# Patient Record
Sex: Male | Born: 1980 | Race: Black or African American | Hispanic: No | Marital: Married | Smoking: Never smoker
Health system: Southern US, Community
[De-identification: ages and names within clinical notes are randomized; demographics above are authoritative.]

## PROBLEM LIST (undated history)

## (undated) VITALS — BP 115/74 | HR 97 | Temp 98.4°F | Resp 16 | Ht 69.8 in | Wt 142.0 lb

## (undated) DIAGNOSIS — F259 Schizoaffective disorder, unspecified: Secondary | ICD-10-CM

## (undated) DIAGNOSIS — R569 Unspecified convulsions: Secondary | ICD-10-CM

## (undated) DIAGNOSIS — F250 Schizoaffective disorder, bipolar type: Secondary | ICD-10-CM

---

## 2015-07-10 ENCOUNTER — Encounter (HOSPITAL_COMMUNITY): Payer: Self-pay | Admitting: Emergency Medicine

## 2015-07-10 ENCOUNTER — Emergency Department (HOSPITAL_COMMUNITY)
Admission: EM | Admit: 2015-07-10 | Discharge: 2015-07-11 | Disposition: A | Discharge status: Other Acute Inpatient Hospital | Payer: Self-pay | Attending: Emergency Medicine | Admitting: Emergency Medicine

## 2015-07-10 DIAGNOSIS — F29 Unspecified psychosis not due to a substance or known physiological condition: Secondary | ICD-10-CM | POA: Insufficient documentation

## 2015-07-10 DIAGNOSIS — R1110 Vomiting, unspecified: Secondary | ICD-10-CM | POA: Insufficient documentation

## 2015-07-10 DIAGNOSIS — R231 Pallor: Secondary | ICD-10-CM | POA: Insufficient documentation

## 2015-07-10 LAB — RAPID URINE DRUG SCREEN, HOSP PERFORMED
Amphetamines: NOT DETECTED
Barbiturates: NOT DETECTED
Benzodiazepines: NOT DETECTED
COCAINE: NOT DETECTED
OPIATES: NOT DETECTED
Tetrahydrocannabinol: NOT DETECTED

## 2015-07-10 LAB — COMPREHENSIVE METABOLIC PANEL
ALK PHOS: 45 U/L (ref 38–126)
ALT: 13 U/L — AB (ref 17–63)
ANION GAP: 9 (ref 5–15)
AST: 24 U/L (ref 15–41)
Albumin: 4.5 g/dL (ref 3.5–5.0)
BILIRUBIN TOTAL: 2 mg/dL — AB (ref 0.3–1.2)
BUN: <5 mg/dL — ABNORMAL LOW (ref 6–20)
CALCIUM: 10 mg/dL (ref 8.9–10.3)
CO2: 27 mmol/L (ref 22–32)
CREATININE: 0.89 mg/dL (ref 0.61–1.24)
Chloride: 104 mmol/L (ref 101–111)
Glucose, Bld: 117 mg/dL — ABNORMAL HIGH (ref 65–99)
Potassium: 3.7 mmol/L (ref 3.5–5.1)
Sodium: 140 mmol/L (ref 135–145)
TOTAL PROTEIN: 7.6 g/dL (ref 6.5–8.1)

## 2015-07-10 LAB — CBC
HCT: 43.3 % (ref 39.0–52.0)
HEMOGLOBIN: 15 g/dL (ref 13.0–17.0)
MCH: 32.3 pg (ref 26.0–34.0)
MCHC: 34.6 g/dL (ref 30.0–36.0)
MCV: 93.3 fL (ref 78.0–100.0)
Platelets: 228 10*3/uL (ref 150–400)
RBC: 4.64 MIL/uL (ref 4.22–5.81)
RDW: 13.2 % (ref 11.5–15.5)
WBC: 6.2 10*3/uL (ref 4.0–10.5)

## 2015-07-10 LAB — URINALYSIS, ROUTINE W REFLEX MICROSCOPIC
BILIRUBIN URINE: NEGATIVE
Glucose, UA: NEGATIVE mg/dL
HGB URINE DIPSTICK: NEGATIVE
KETONES UR: NEGATIVE mg/dL
Leukocytes, UA: NEGATIVE
Nitrite: NEGATIVE
Protein, ur: NEGATIVE mg/dL
SPECIFIC GRAVITY, URINE: 1.014 (ref 1.005–1.030)
pH: 7 (ref 5.0–8.0)

## 2015-07-10 LAB — ETHANOL

## 2015-07-10 MED ORDER — LORAZEPAM 1 MG PO TABS
1.0000 mg | ORAL_TABLET | Freq: Three times a day (TID) | ORAL | Status: DC | PRN
  Administered 2015-07-11: 1 mg via ORAL
  Filled 2015-07-10: qty 1

## 2015-07-10 NOTE — ED Provider Notes (Signed)
CSN: [id number]     Arrival date & time 07/10/15  0906 History   First MD Initiated Contact with Patient 07/10/15 0913     Chief Complaint  Patient presents with  . Immunizations     (Consider location/radiation/quality/duration/timing/severity/associated sxs/prior Treatment) HPI Patient presents with colleagues, case management to assist with the history of present illness. The patient still denies any physical pain, states that he would like to have immunizations to travel back to [place], his home country.  Patient arrived here 3 years ago, denies medical problems, colleagues corroborate this. Over the past 3 days, the patient has had increasingly atypical behavior, responding to both visual and auditory hallucination, having sleeplessness, repetitive speech. No report of new fever, vomiting, falling, trauma. Patient has no known psychiatric history.   History reviewed. No pertinent past medical history. History reviewed. No pertinent past surgical history. No family history on file. Social History  Substance Use Topics  . Smoking status: Never Smoker   . Smokeless tobacco: Never Used  . Alcohol Use: No    Review of Systems  Unable to perform ROS: Psychiatric disorder  Constitutional: Positive for activity change.  HENT: Negative.   Eyes: Negative for visual disturbance.  Respiratory: Negative for chest tightness.   Cardiovascular: Negative for chest pain.  Gastrointestinal: Positive for vomiting. Negative for nausea.  Skin: Positive for pallor.  Allergic/Immunologic: Negative for immunocompromised state.      Allergies  Review of patient's allergies indicates no known allergies.  Home Medications   Prior to Admission medications   Medication Sig Start Date End Date Taking? Authorizing Provider  ibuprofen (ADVIL,MOTRIN) 200 MG tablet Take 400 mg by mouth every 6 (six) hours as needed.   Yes Historical Provider, MD   BP 136/87 mmHg  Pulse 89  Temp(Src) 98.3 F  (36.8 C) (Oral)  Resp 17  SpO2 100% Physical Exam  Constitutional: He is oriented to person, place, and time. He appears well-developed. No distress.  HENT:  Head: Normocephalic and atraumatic.  Eyes: Conjunctivae and EOM are normal.  Cardiovascular: Normal rate and regular rhythm.   Pulmonary/Chest: Effort normal. No stridor. No respiratory distress.  Abdominal: He exhibits no distension.  Musculoskeletal: He exhibits no edema.  Neurological: He is alert and oriented to person, place, and time.  Skin: Skin is warm and dry.  Psychiatric: He is slowed and withdrawn. Thought content is delusional.  Nursing note and vitals reviewed.   ED Course  Procedures (including critical care time) Labs Review Labs Reviewed  COMPREHENSIVE METABOLIC PANEL - Abnormal; Notable for the following:    Glucose, Bld 117 (*)    BUN <5 (*)    ALT 13 (*)    Total Bilirubin 2.0 (*)    All other components within normal limits  MALARIA SMEAR  URINE RAPID DRUG SCREEN, HOSP PERFORMED  CBC  ETHANOL  URINALYSIS, ROUTINE W REFLEX MICROSCOPIC (NOT AT [facility])   Malaria smear pending   Initial labs reviewed, patient medically cleared  for psychiatric evaluation   2:03 PM Patient carries about the need for hospitalization, but amenable to evaluation  MDM   this young male presents with concern from family members for new atypical behavior, sleeplessness.  Symptoms consistent with psychosis versus manic behavior. Patient is not overtly suicidal, homicidal. However, with his new change in behavior, he is medically clear for further psychiatric evaluation.   Gerhard Munchobert Twanna Resh, MD 07/10/15 [phone]

## 2015-07-10 NOTE — ED Notes (Signed)
Patient states he needs vaccines to go to his country of [place].  Patient is unclear as to what he needs.

## 2015-07-10 NOTE — ED Notes (Signed)
Pt woke up screaming (in pure terror) from sleep.  He apparently experienced a night terror.  When RN arrived to room, RN spoke in a calm reassuring voice which calmed the pt down.  His visitor gave him a bible to read which further assisted in calming the pt down.  Visitor stated this has been occuring nightly for the past three nights.  Pt was unable to verbalize what he experienced.

## 2015-07-10 NOTE — BH Assessment (Addendum)
Tele Assessment Note   Peter Golden is an 35 y.o. male who present voluntarily to [facility] under the request of his friend, Peter Golden [phone]). Pt believes that he is in the ED to receive vaccinations to go back to his hometown [place]. Counselor spoke to [place] before assessment as a collateral contact, given the circumstances.  Peter Golden shared that she, as well as pt's roommate, have noticed some behavioral changes in the pt in the past few weeks or so, but more specifically since this past Friday. Peter Golden reported that pt has become very isolative, has not been doing well in school (which is very unlike him), not eating or sleeping well, and talking to himself. Peter Golden added that she asked pt to spend the weekend with her and her family, due to his bizarre behaviors. She stated that, while there, he was isolative and stayed awake the entire night, taking to himself. Peter Golden also shared that, prior to this, pt called her and informed her that he was going to die and asked her to pray for him. Peter Golden additionally reported that, per his roommate, pt has not been sleeping at all, when at home. Peter Golden indicated that, over the last few days, pt has been pre-occupied with going back to his hometown [place] and has been continually talking about it. She shared that they have tried to redirect pt and he will refer right back to going to [place]. Peter Golden reports that pt does not use drugs or alcohol and has no psychiatric hx, that she is aware of.   Counselor assessed pt by himself. Pt was a poor historian and had confused speech. He did not appear to be responding to internal stimuli, but was clearly preoccupied with going back to his hometown to "resolve an issue" and that he "will come back in 3 days". Counselor attempted to ask pt questions about AVH and if he had taken or been given any strange drinks or pills. Pt denied both and referred back to having to get to his hometown to resolve an  issue and returning in 3 days. Counselor asked pt about not sleeping and talking to himself all night. Pt stated that he sometimes talks to himself, but would stop once he got to [place] and resolved the issue and returned in 3 days. UDS and ETOH labs not completed at time of this writing.   Diagnosis: Unspecified schizophrenia spectrum and other psychotic disorder  Past Medical History: History reviewed. No pertinent past medical history.  History reviewed. No pertinent past surgical history.  Family History: No family history on file.  Social History:  reports that he has never smoked. He has never used smokeless tobacco. He reports that he does not drink alcohol or use illicit drugs.  Additional Social History:  Alcohol / Drug Use Pain Medications: unknown Prescriptions: unknown Over the Counter: unknown History of alcohol / drug use?: No history of alcohol / drug abuse (per collateral from family friend, Biomedical engineer)  CIWA: CIWA-Ar BP: 146/93 mmHg Pulse Rate: 87 COWS:    PATIENT STRENGTHS: (choose at least two) Average or above average intelligence Capable of independent living Supportive family/friends  Allergies: No Known Allergies  Home Medications:  (Not in a hospital admission)  OB/GYN Status:  No LMP for male patient.  General Assessment Data Location of Assessment: [facility] ED TTS Assessment: In system Is this a Tele or Face-to-Face Assessment?: Tele Assessment Is this an Initial Assessment or a Re-assessment for this encounter?: Initial Assessment Marital status:  Single Is patient pregnant?: No Pregnancy Status: No Living Arrangements: Non-relatives/Friends (roommate) Can pt return to current living arrangement?: Yes Admission Status: Voluntary (pt believes he's here to get vaccines to go home to [place]) Is patient capable of signing voluntary admission?: Yes Referral Source: Self/Family/Friend Insurance type: none  Medical Screening Exam [facility] Walk-in ONLY) Medical  Exam completed: Yes  Crisis Care Plan Living Arrangements: Non-relatives/Friends (roommate) Name of Psychiatrist: none Name of Therapist: none  Education Status Is patient currently in school?: Yes Current Grade: college Highest grade of school patient has completed: in college Name of school: [employer]  Risk to self with the past 6 months Suicidal Ideation: No Has patient been a risk to self within the past 6 months prior to admission? : No Suicidal Intent: No Has patient had any suicidal intent within the past 6 months prior to admission? : No Is patient at risk for suicide?: No Suicidal Plan?: No Has patient had any suicidal plan within the past 6 months prior to admission? : No Access to Means: No What has been your use of drugs/alcohol within the last 12 months?: friend, Peter PeerShantal, reports no use Previous Attempts/Gestures: No Intentional Self Injurious Behavior: None Family Suicide History: Unknown Recent stressful life event(s): Other (Comment) (unknown) Persecutory voices/beliefs?: No Depression: No Substance abuse history and/or treatment for substance abuse?: No Suicide prevention information given to non-admitted patients: Not applicable  Risk to Others within the past 6 months Homicidal Ideation: No Does patient have any lifetime risk of violence toward others beyond the six months prior to admission? : Unknown Thoughts of Harm to Others: No Current Homicidal Intent: No Current Homicidal Plan: No Access to Homicidal Means: No History of harm to others?: No Assessment of Violence: None Noted Does patient have access to weapons?: No Criminal Charges Pending?: No Does patient have a court date: No Is patient on probation?: No  Psychosis Hallucinations:  (collateral indicates pt talking to himself all night) Delusions: Unspecified (pt preoccupied with going to home country due to "issue")  Mental Status Report Appearance/Hygiene: Unremarkable Eye  Contact: Fair Motor Activity: Unremarkable Speech: Other (Comment) (confused ) Level of Consciousness: Quiet/awake Mood: Apprehensive, Preoccupied Affect: Apprehensive, Preoccupied Anxiety Level: Minimal Thought Processes: Unable to Assess Judgement: Unable to Assess Orientation: Unable to assess Obsessive Compulsive Thoughts/Behaviors: Unable to Assess  Cognitive Functioning Concentration: Decreased Memory: Unable to Assess IQ: Average Insight: Unable to Assess Impulse Control: Unable to Assess Appetite: Poor Sleep: Decreased Total Hours of Sleep: 1 Vegetative Symptoms: None  ADLScreening [facility] Assessment Services) Patient's cognitive ability adequate to safely complete daily activities?: Yes Patient able to express need for assistance with ADLs?: Yes Independently performs ADLs?: Yes (appropriate for developmental age)  Prior Inpatient Therapy Prior Inpatient Therapy: No  Prior Outpatient Therapy Prior Outpatient Therapy: No Does patient have an ACCT team?: No Does patient have Intensive In-House Services?  : No Does patient have [facility] services? : No Does patient have P4CC services?: No  ADL Screening (condition at time of admission) Patient's cognitive ability adequate to safely complete daily activities?: Yes Is the patient deaf or have difficulty hearing?: No Does the patient have difficulty seeing, even when wearing glasses/contacts?: No Does the patient have difficulty concentrating, remembering, or making decisions?: Yes Patient able to express need for assistance with ADLs?: Yes Does the patient have difficulty dressing or bathing?: No Independently performs ADLs?: Yes (appropriate for developmental age) Does the patient have difficulty walking or climbing stairs?: No Weakness of Legs: None Weakness of Arms/Hands:  None  Home Assistive Devices/Equipment Home Assistive Devices/Equipment: None  Therapy Consults (therapy consults require a physician  order) PT Evaluation Needed: No OT Evalulation Needed: No SLP Evaluation Needed: No Abuse/Neglect Assessment (Assessment to be complete while patient is alone) Physical Abuse:  (unknown) Verbal Abuse:  (unknown) Sexual Abuse:  (unknown) Exploitation of patient/patient's resources:  (unknown) Self-Neglect:  (unknown) Values / Beliefs Cultural Requests During Hospitalization: None Spiritual Requests During Hospitalization: None Consults Spiritual Care Consult Needed: No Social Work Consult Needed: No      Additional Information 1:1 In Past 12 Months?: No CIRT Risk: No Elopement Risk: No Does patient have medical clearance?: Yes     Disposition:  Disposition Initial Assessment Completed for this Encounter: Yes Disposition of Patient: Inpatient treatment program (per Peter Kaufmann, NP) Type of inpatient treatment program: Adult  Laddie Aquas 07/10/2015 11:36 AM

## 2015-07-10 NOTE — Progress Notes (Signed)
Patient has been referred at: [facility] - per Mickeal SkinnerPhoebe, fax referral for review. [facility] - per Despina HiddenKassy, accepting referrals today. [facility] - per Arlys JohnBrian, fax referral. [facility] - [facility], [facility] beds. [facility] - per Christiane HaJonathan, fax referral for review, 1 geriatric male bed available.  CSW will continue to seek placement.  Melbourne Abtsatia Marley Charlot, LCSWA Disposition staff 07/10/2015 3:59 PM

## 2015-07-10 NOTE — ED Notes (Signed)
Thrown away uneaten dinner, asked pt if he wanted anything to eat.  He accepted water and a snack.  Pt appeared very w/drawn.

## 2015-07-10 NOTE — ED Notes (Signed)
Security wanded patient 

## 2015-07-10 NOTE — ED Notes (Signed)
Spoke with Restaurant manager, fast foodDirector Misty concerning pt and need for family at bedside. Explained that pt is not SI/HI but having flight of ideas, not sleeping, not understanding why he is here. All he understands is that [place] go [place]. It would be beneficial for family member to be with pt at this time. Misty agreeable to having one family member sit with pt to ensure his safety. No need for IVC. Pt's roommates too talk belongings home. Pt's family member at bedside. Pt in scrubs, has been wanded and is resting in room.

## 2015-07-10 NOTE — ED Notes (Signed)
TTS monitor at bedside with the patient.

## 2015-07-11 ENCOUNTER — Encounter (HOSPITAL_COMMUNITY): Payer: Self-pay | Admitting: *Deleted

## 2015-07-11 ENCOUNTER — Inpatient Hospital Stay (HOSPITAL_COMMUNITY)
Admission: AD | Admit: 2015-07-11 | Discharge: 2015-07-18 | DRG: 885 | Disposition: A | Discharge status: Home / Self Care | Payer: Federal, State, Local not specified - Other | Source: Intra-hospital | Attending: Psychiatry | Admitting: Psychiatry

## 2015-07-11 DIAGNOSIS — F209 Schizophrenia, unspecified: Secondary | ICD-10-CM | POA: Diagnosis present

## 2015-07-11 DIAGNOSIS — E221 Hyperprolactinemia: Secondary | ICD-10-CM | POA: Diagnosis present

## 2015-07-11 DIAGNOSIS — F323 Major depressive disorder, single episode, severe with psychotic features: Principal | ICD-10-CM | POA: Clinically undetermined

## 2015-07-11 DIAGNOSIS — F419 Anxiety disorder, unspecified: Secondary | ICD-10-CM | POA: Diagnosis present

## 2015-07-11 DIAGNOSIS — E785 Hyperlipidemia, unspecified: Secondary | ICD-10-CM | POA: Diagnosis present

## 2015-07-11 DIAGNOSIS — G4700 Insomnia, unspecified: Secondary | ICD-10-CM | POA: Diagnosis present

## 2015-07-11 DIAGNOSIS — F329 Major depressive disorder, single episode, unspecified: Secondary | ICD-10-CM | POA: Diagnosis not present

## 2015-07-11 DIAGNOSIS — F29 Unspecified psychosis not due to a substance or known physiological condition: Secondary | ICD-10-CM | POA: Diagnosis not present

## 2015-07-11 DIAGNOSIS — F251 Schizoaffective disorder, depressive type: Secondary | ICD-10-CM | POA: Clinically undetermined

## 2015-07-11 DIAGNOSIS — R45851 Suicidal ideations: Secondary | ICD-10-CM | POA: Diagnosis not present

## 2015-07-11 MED ORDER — ACETAMINOPHEN 325 MG PO TABS: 650.0000 mg | ORAL_TABLET | Freq: Four times a day (QID) | ORAL | Status: DC | PRN

## 2015-07-11 MED ORDER — LORAZEPAM 1 MG PO TABS: 1.0000 mg | ORAL_TABLET | ORAL | Status: DC | PRN

## 2015-07-11 MED ORDER — ALUM & MAG HYDROXIDE-SIMETH 200-200-20 MG/5ML PO SUSP: 30.0000 mL | ORAL | Status: DC | PRN

## 2015-07-11 MED ORDER — RISPERIDONE 2 MG PO TBDP: 2.0000 mg | ORAL_TABLET | Freq: Three times a day (TID) | ORAL | Status: DC | PRN

## 2015-07-11 MED ORDER — HYDROXYZINE HCL 25 MG PO TABS
25.0000 mg | ORAL_TABLET | Freq: Four times a day (QID) | ORAL | Status: DC | PRN
  Filled 2015-07-11: qty 10

## 2015-07-11 MED ORDER — MAGNESIUM HYDROXIDE 400 MG/5ML PO SUSP: 30.0000 mL | Freq: Every day | ORAL | Status: DC | PRN

## 2015-07-11 MED ORDER — ZIPRASIDONE MESYLATE 20 MG IM SOLR: 20.0000 mg | INTRAMUSCULAR | Status: DC | PRN

## 2015-07-11 MED ORDER — TRAZODONE HCL 50 MG PO TABS
50.0000 mg | ORAL_TABLET | Freq: Every evening | ORAL | Status: DC | PRN
  Administered 2015-07-12 – 2015-07-17 (×6): 50 mg via ORAL
  Filled 2015-07-11 (×17): qty 1

## 2015-07-11 NOTE — Progress Notes (Addendum)
Admission note: Patient is a 35 yo male who presented to [facility] accompanied by a friend.  Patient believed that he was coming to the ED to get vaccinations to go back to his hometown [place].  His friend reported that patient has had some behavioral changes.  His roommate has also noted some changes in the patient's behavior for the past few weeks.  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  Patient is taking accounting classes at [employer].  He has not been eating or sleeping well and has been observed talking to himself.  He has been staying awake at night talking to himself.  Per his friend, Lily PeerShantal, patient has been preoccupied about returning to [place].  Patient is from [place] and has [place] in [place]. Patient has no prior psyche hx and does not use alcohol, tobacco and drugs.  Patient presents with anxious affect.  He states, "I need protection when I leave here." "I'm not safe in this country."  When talking about protection, he becomes very anxious.  He is paranoid and does report hearing voices.  When asked what the voices are saying, he states, "they keep telling me I'm unsafe.  I am not safe."  Patient denies any self harm thoughts or homicidal ideation.  Patient has heavy accent and sometimes gets confused when asked questions.  He speaks [place], however, his main language is [place].  Patient was assured he was safe here; he was oriented to room and unit.

## 2015-07-11 NOTE — ED Notes (Signed)
Pt was unable to go back to sleep after the terror, became restless however was redirected by visitor.  Pt and visitor continued to talk (in french) and it appeared that visitor was able to calm the pt back down.  As RN took vitals, RN asked if the pt was hungry.  He replied that he was and RN provided pt w/ a Malaw[place]urkey sandwich and apple sauce.  He also requested water.  He appeared much more relaxed.

## 2015-07-11 NOTE — ED Notes (Addendum)
Patient sister called to talk to patient, Pt. Was in shower at the time. She said she will call back later, or come up here after work. I informed patient that she called.

## 2015-07-11 NOTE — Progress Notes (Signed)
Adult Psychoeducational Group Note  Date:  07/11/2015 Time:  8:30 PM  Group Topic/Focus:  Wrap-Up Group:   The focus of this group is to help patients review their daily goal of treatment and discuss progress on daily workbooks.  Participation Level:  Did Not Attend  Pt did not attend wrap-up group.    Peter NeerJasmine S [name] 07/11/2015, 8:39 PM

## 2015-07-11 NOTE — ED Notes (Signed)
Pt to shower

## 2015-07-11 NOTE — ED Notes (Signed)
Patient was given a snack and drink. A regular diet ordered for lunch. 

## 2015-07-11 NOTE — Progress Notes (Signed)
[facility] Dr. Elna BreslowEappen. Can arrive anytime after 3pm. Report number is [id number].  Ilean SkillMeghan Maribeth Jiles, MSW, LCSW Clinical Social Work, Disposition  07/11/2015 [phone]

## 2015-07-11 NOTE — ED Notes (Signed)
Breakfast try delivered

## 2015-07-11 NOTE — ED Notes (Signed)
Patient given snack. Dinner tray ordered,.

## 2015-07-11 NOTE — ED Notes (Signed)
No belongings located for patient.

## 2015-07-11 NOTE — ED Notes (Signed)
Lunch Tray delivered 

## 2015-07-11 NOTE — Tx Team (Addendum)
Initial Interdisciplinary Treatment Plan   PATIENT STRESSORS: Educational concerns Traumatic event   PATIENT STRENGTHS: Average or above average intelligence Capable of independent living Communication skills Physical Health Supportive family/friends   PROBLEM LIST: Problem List/Patient Goals Date to be addressed Date deferred Reason deferred Estimated date of resolution  Psychosis 07/11/2015     Anxiety 07/11/2015     Maladjustment to living in new country 07/10/2105     Some communication barrier/French is main language 07/11/2015           "I need to get rest and sleep"                         DISCHARGE CRITERIA:  Improved stabilization in mood, thinking, and/or behavior Need for constant or close observation no longer present Verbal commitment to aftercare and medication compliance  PRELIMINARY DISCHARGE PLAN: Outpatient therapy Return to previous living arrangement Return to previous work or school arrangements  PATIENT/FAMIILY INVOLVEMENT: This treatment plan has been presented to and reviewed with the patient, Peter Golden.  The patient and family have been given the opportunity to ask questions and make suggestions.  Cranford MonBeaudry, Caroline Evans 07/11/2015, 6:10 PM

## 2015-07-12 ENCOUNTER — Encounter (HOSPITAL_COMMUNITY): Payer: Self-pay | Admitting: Psychiatry

## 2015-07-12 DIAGNOSIS — F209 Schizophrenia, unspecified: Secondary | ICD-10-CM | POA: Clinically undetermined

## 2015-07-12 DIAGNOSIS — R45851 Suicidal ideations: Secondary | ICD-10-CM

## 2015-07-12 DIAGNOSIS — F29 Unspecified psychosis not due to a substance or known physiological condition: Secondary | ICD-10-CM

## 2015-07-12 DIAGNOSIS — F329 Major depressive disorder, single episode, unspecified: Secondary | ICD-10-CM

## 2015-07-12 DIAGNOSIS — F251 Schizoaffective disorder, depressive type: Secondary | ICD-10-CM | POA: Clinically undetermined

## 2015-07-12 MED ORDER — CITALOPRAM HYDROBROMIDE 10 MG PO TABS
10.0000 mg | ORAL_TABLET | Freq: Every day | ORAL | Status: DC
  Administered 2015-07-12 – 2015-07-16 (×5): 10 mg via ORAL
  Filled 2015-07-12 (×6): qty 1

## 2015-07-12 MED ORDER — HALOPERIDOL 5 MG PO TABS
5.0000 mg | ORAL_TABLET | Freq: Every day | ORAL | Status: DC
  Administered 2015-07-12: 5 mg via ORAL
  Filled 2015-07-12 (×3): qty 1

## 2015-07-12 MED ORDER — BENZTROPINE MESYLATE 0.5 MG PO TABS
0.5000 mg | ORAL_TABLET | Freq: Every day | ORAL | Status: DC
  Administered 2015-07-12 – 2015-07-17 (×6): 0.5 mg via ORAL
  Filled 2015-07-12: qty 7
  Filled 2015-07-12 (×7): qty 1
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
## 2015-07-12 NOTE — H&P (Signed)
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 Diagnosis:   Patient Active Problem List   Diagnosis Date Noted  . Psychosis [F29] 07/12/2015  . Depression [F32.9] 07/12/2015   History of Present Illness:: Peter Golden is a 35 y.o.  AA male, single, student at [employer], denies hx of past mental illness or medical problems, presented to [facility]  voluntarily  under the request of his friend, Peter Golden [phone]). Pt was thereafter admitted to [facility] for further management.  Per initial notes in EHR " Pt believes that he is in the ED to receive vaccinations to go back to his hometown of [place], [place]. Counselor spoke to Peter Golden before assessment as a collateral contact, given the circumstances. Peter Golden shared that she, as well as pt's roommate, have noticed some behavioral changes in the pt in the past few weeks or so, but more specifically since this past Friday. Peter Golden reported that pt has become very isolative, has not been doing well in school (which is very unlike him), not eating or sleeping well, and talking to himself. Peter Golden added that she asked pt to spend the weekend with her and her family, due to his bizarre behaviors. She stated that, while there, he was isolative and stayed awake the entire night, taking to himself. Peter Golden also shared that, prior to this, pt called her and informed her that he was going to die and asked her to pray for him. Peter Golden additionally reported that, per his roommate, pt has not been sleeping at all, when at home. Peter Golden indicated that, over the last few days, pt has been pre-occupied with going back to his hometown in [place], [place] and has been  continually talking about it. She shared that they have tried to redirect pt and he will refer right back to going to [place]. Peter Golden reports that pt does not use drugs or alcohol and has no psychiatric hx, that she is aware of. Counselor assessed pt by himself. Pt was a poor historian and had confused speech. He did not appear to be responding to internal stimuli, but was clearly preoccupied with going back to his hometown to "resolve an issue" and that he "will come back in 3 days". Counselor attempted to ask pt questions about AVH and if he had taken or been given any strange drinks or pills. Pt denied both and referred back to having to get to his hometown to resolve an issue and returning in 3 days. Counselor asked pt about not sleeping and talking to himself all night. Pt stated that he sometimes talks to himself, but would stop once he got to [place] and resolved the issue and returned in 3 days."   Patient seen and chart reviewed TODAY .Discussed patient with treatment team.  Patient today seen as calm , cooperative , pleasant. Pt speaks english , but sometimes questions needs to be broken down in to simpler sentences. Pt reports that he is originally from [place], [place]. Pt came to [place] , 3 years ago for his education. Pt reports that he currently goes to [employer] and is hoping to get his Bachelors soon. Pt wants to get a job and also bring his family here. Pt reports that all his family is  in [place]. He has not seen them in three years. Pt reports that he has an Uncle here - who is not really an uncle , but a support system , who helped him when he originally came to [place]. Pt reports that he moved out from his Uncles place and currently lives in an apartment. However , he feels that his uncle is still trying to control him, not really giving him his freedom. Pt states that this is a 'free country" , but his uncle still wants to control him. Pt reports that after his uncle or his wife calls him on the phone , he  always gets upset and has racing thoughts. Pt reports that his frustration has reached a point where he feels that his uncle can actually hurt him. Pt reports that his uncle is a spiritual person and he feels that his uncle can actually do "Vodoo" against him and may even kill him.   Pt also reports anxiety sx , constantly worries about his uncle . Pt reports sadness about being away from family. Pt reports decreased sleep, appetite and loss of concentration since the past >2 weeks. Pt reports that sometimes when he goes to bed , he is afraid that he will die and will not wake up. However , when asked about SI , he reports he does not want to do anything to hurt self. Pt does report that he does have thoughts about hurting this uncle , may be do "vodoo" against them.  Pt denies past hx of mental illness or suicide attempts.  Pt denies hx of abusing any substances and his UDS/BAL is negative.    Associated Signs/Symptoms: Depression Symptoms:  depressed mood, anhedonia, insomnia, difficulty concentrating, hopelessness, anxiety, loss of energy/fatigue, disturbed sleep, decreased appetite, (Hypo) Manic Symptoms:  Delusions, Impulsivity, Anxiety Symptoms:  Excessive Worry, Panic Symptoms, Psychotic Symptoms:  Delusions, Paranoia, PTSD Symptoms: Negative Total Time spent with patient: 45 minutes  Past Psychiatric History: Pt denies past hx of mental illness or suicide attempts.  Risk to Self: Is patient at risk for suicide?: No Risk to Others:   Prior Inpatient Therapy:   Prior Outpatient Therapy:    Alcohol Screening: 1. How often do you have a drink containing alcohol?: Never 9. Have you or someone else been injured as a result of your drinking?: No 10. Has a relative or friend or a doctor or another health worker been concerned about your drinking or suggested you cut down?: No Alcohol Use Disorder Identification Test Final Score (AUDIT): 0 Brief Intervention: AUDIT score less  than 7 or less-screening does not suggest unhealthy drinking-brief intervention not indicated Substance Abuse History in the last 12 months:  No. Consequences of Substance Abuse: Negative Previous Psychotropic Medications: No  Psychological Evaluations: No  Past Medical History:denies hx of HTN, DM,asthma, thyroid sx. Family History: Pt denies hx of HTN, DM, cardiac disease or thyroid disease in family. Family History  Problem Relation Age of Onset  . Mental illness Neg Hx    Family Psychiatric  History: Pt denies hx of mental illness, drug abuse or suicide in family. Social History: Pt is single , originally from [place], [place]. Pt currently lives in [place] , student at [employer] , living in [place] since the past 3 years . Pt denies any legal issues. Pt is religious . History  Alcohol Use No     History  Drug Use No    Social History   Social History  . Marital Status: Single  Spouse Name: N/A  . Number of Children: N/A  . Years of Education: N/A   Social History Main Topics  . Smoking status: Never Smoker   . Smokeless tobacco: Never Used  . Alcohol Use: No  . Drug Use: No  . Sexual Activity: Not Asked   Other Topics Concern  . None   Social History Narrative   Additional Social History:                         Allergies:  No Known Allergies Lab Results:  Results for orders placed or performed during the hospital encounter of 07/10/15 (from the past 48 hour(s))  Urine rapid drug screen (hosp performed)     Status: None   Collection Time: 07/10/15 11:34 AM  Result Value Ref Range   Opiates NONE DETECTED NONE DETECTED   Cocaine NONE DETECTED NONE DETECTED   Benzodiazepines NONE DETECTED NONE DETECTED   Amphetamines NONE DETECTED NONE DETECTED   Tetrahydrocannabinol NONE DETECTED NONE DETECTED   Barbiturates NONE DETECTED NONE DETECTED    Comment:        DRUG SCREEN FOR MEDICAL PURPOSES ONLY.  IF CONFIRMATION IS NEEDED FOR ANY PURPOSE, NOTIFY LAB WITHIN 5  DAYS.        LOWEST DETECTABLE LIMITS FOR URINE DRUG SCREEN Drug Class       Cutoff (ng/mL) Amphetamine      1000 Barbiturate      200 Benzodiazepine   200 Tricyclics       300 Opiates          300 Cocaine          300 THC              50   Urinalysis, Routine w reflex microscopic (not at [facility])     Status: None   Collection Time: 07/10/15 11:34 AM  Result Value Ref Range   Color, Urine YELLOW YELLOW   APPearance CLEAR CLEAR   Specific Gravity, Urine 1.014 1.005 - 1.030   pH 7.0 5.0 - 8.0   Glucose, UA NEGATIVE NEGATIVE mg/dL   Hgb urine dipstick NEGATIVE NEGATIVE   Bilirubin Urine NEGATIVE NEGATIVE   Ketones, ur NEGATIVE NEGATIVE mg/dL   Protein, ur NEGATIVE NEGATIVE mg/dL   Nitrite NEGATIVE NEGATIVE   Leukocytes, UA NEGATIVE NEGATIVE    Comment: MICROSCOPIC NOT DONE ON URINES WITH NEGATIVE PROTEIN, BLOOD, LEUKOCYTES, NITRITE, OR GLUCOSE <1000 mg/dL.    Metabolic Disorder Labs:  No results found for: HGBA1C, MPG No results found for: PROLACTIN No results found for: CHOL, TRIG, HDL, CHOLHDL, VLDL, LDLCALC  Current Medications: Current Facility-Administered Medications  Medication Dose Route Frequency Provider Last Rate Last Dose  . acetaminophen (TYLENOL) tablet 650 mg  650 mg Oral Q6H PRN Kerry Hough, PA-C      . alum & mag hydroxide-simeth (MAALOX/MYLANTA) 200-200-20 MG/5ML suspension 30 mL  30 mL Oral Q4H PRN Kerry Hough, PA-C      . benztropine (COGENTIN) tablet 0.5 mg  0.5 mg Oral QHS Jamesyn Moorefield, MD      . citalopram (CELEXA) tablet 10 mg  10 mg Oral Daily Shavana Calder, MD      . haloperidol (HALDOL) tablet 5 mg  5 mg Oral QHS Kevonta Phariss, MD      . hydrOXYzine (ATARAX/VISTARIL) tablet 25 mg  25 mg Oral Q6H PRN Kerry Hough, PA-C      . risperiDONE (RISPERDAL M-TABS) disintegrating tablet 2 mg  2 mg Oral Q8H PRN Kerry HoughSpencer E Simon, PA-C       And  . LORazepam (ATIVAN) tablet 1 mg  1 mg Oral PRN Kerry HoughSpencer E Simon, PA-C       And  . ziprasidone  (GEODON) injection 20 mg  20 mg Intramuscular PRN Kerry HoughSpencer E Simon, PA-C      . magnesium hydroxide (MILK OF MAGNESIA) suspension 30 mL  30 mL Oral Daily PRN Kerry HoughSpencer E Simon, PA-C      . traZODone (DESYREL) tablet 50 mg  50 mg Oral QHS,MR X 1 Spencer E Simon, PA-C   50 mg at 07/11/15 2300   PTA Medications: Prescriptions prior to admission  Medication Sig Dispense Refill Last Dose  . ibuprofen (ADVIL,MOTRIN) 200 MG tablet Take 400 mg by mouth every 6 (six) hours as needed.   07/09/2015 at Unknown time    Musculoskeletal: Strength & Muscle Tone: within normal limits Gait & Station: normal Patient leans: N/A  Psychiatric Specialty Exam: Physical Exam  Constitutional:  I concur with PE done in ED.    Review of Systems  Psychiatric/Behavioral: Positive for depression and hallucinations. The patient is nervous/anxious and has insomnia.   All other systems reviewed and are negative.   Blood pressure 130/80, pulse 87, temperature 99.1 F (37.3 C), temperature source Oral, resp. rate 20, height 5\' 9"  (1.753 m), weight 60.328 kg (133 lb).Body mass index is 19.63 kg/(m^2).  General Appearance: Casual  Eye Contact::  Fair  Speech:  Normal Rate  Volume:  Normal  Mood:  Anxious, Depressed and Hopeless  Affect:  Congruent  Thought Process:  Linear  Orientation:  Full (Time, Place, and Person)  Thought Content:  Delusions, Paranoid Ideation and Rumination  Suicidal Thoughts:  No  Homicidal Thoughts:  Yes.  with intent/plan wants to hurt uncle , may be by Vodoo  Memory:  Immediate;   Fair Recent;   Fair Remote;   Fair  Judgement:  Impaired  Insight:  Shallow  Psychomotor Activity:  Decreased  Concentration:  Poor  Recall:  FiservFair  Fund of Knowledge:Fair  Language: Fair  Akathisia:  No    AIMS (if indicated):     Assets:  Desire for Improvement Physical Health Social Support Talents/Skills Vocational/Educational  ADL's:  Intact  Cognition: WNL  Sleep:  Number of Hours: 7      Treatment Plan Summary:Patient is a 89 y old AAM , with no past psychiatric hx, presented after having delusional thoughts as well as paranoia and depressed mood. Pt will need inpatient stabilization. Daily contact with patient to assess and evaluate symptoms and progress in treatment and Medication management  Patient will benefit from inpatient treatment and stabilization.  Estimated length of stay is 5-7 days.  Reviewed past medical records,treatment plan.  Will start a trial of Celexa 10 mg po daily for affective sx. Will add Haldol 5 mg po qhs for psychosis. Will add Cogentin 0.5 mg po qhs for EPS. Will continue Trazodone 50 mg po qhs for sleep. Will add PRN medications as per agitation protocol. Will continue to monitor vitals ,medication compliance and treatment side effects while patient is here.  Will monitor for medical issues as well as call consult as needed.  Reviewed labs cbc- wnl, cmp - wnl, uds- negative, BAL<5, Will order TSH, lipid panel, Hba1c, Prolactin as well as EKG for qtc. CSW will start working on disposition.  Patient to participate in therapeutic milieu .       Observation Level/Precautions:  15  minute checks    Psychotherapy:  Individual and group therapy     Consultations:  Social worker  Discharge Concerns:stability and safety         I certify that inpatient services furnished can reasonably be expected to improve the patient's condition.   Lysbeth Dicola MD 1/11/201711:31 AM

## 2015-07-12 NOTE — Progress Notes (Signed)
D) Pt. Affect flat, mood depressed.  Pt. Has somewhat of a language barrier.  Need for staff to repeat and simplify directions at time.  Pt. Denies A/V hallucinations, but appears distressed and preoccupied when alone in room.  Pt. Cooperative with new medication. Pt. Minimally interactive with staff and peers.  A) Pt. Offered emotional support.  Medication reviewed. EKG completed and placed in chart. R) Pt. Receptive to care and continues safe at this time.

## 2015-07-12 NOTE — [facility] Group Notes (Signed)
[facility]  07/12/2015 , 1:32 PM    Type of Therapy:  [employer] Presentation  Participation Level:  Active  Participation Quality:  Attentive  Affect:  Blunted  Cognitive:  Oriented  Insight:  Limited  Engagement in Therapy:  Engaged  Modes of Intervention:  Discussion, Education and Socialization  Summary of Progress/Problems:  Onalee HuaDavid from [employer] came to present his recovery story and play the guitar.  Stayed the entire time.  Attentive throughout.  Peter Golden, Peter Golden B 07/12/2015 , 1:32 PM

## 2015-07-12 NOTE — Progress Notes (Signed)
D. Pt up and visible in milieu this evening, did not attend or participate in evening group activity. Pt had minimal interaction with staff or peers and did not verbalize any complaints this evening. A. Safety maintained R. Will continue to monitor.

## 2015-07-12 NOTE — Tx Team (Signed)
Interdisciplinary Treatment Plan Update (Adult)  Date:  07/12/2015   Time Reviewed:  8:14 AM   Progress in Treatment: Attending groups: Yes. Participating in groups:  Yes. Taking medication as prescribed:  Yes. Tolerating medication:  Yes. Family/Significant other contact made:  No Patient understands diagnosis:  Yes  As evidenced by seeking help with "hearing voices and confusion" Discussing patient identified problems/goals with staff:  Yes, see initial care plan. Medical problems stabilized or resolved:  Yes. Denies suicidal/homicidal ideation: Yes. Issues/concerns per patient self-inventory:  No. Other:  New problem(s) identified:  Discharge Plan or Barriers:  Reason for Continuation of Hospitalization: Anxiety Depression Medication stabilization Other; describe Paranoia  Comments:  Peter Golden is an 35 y.o. male who present voluntarily to [facility] under the request of his friend, Daphane Shepherd [phone]). Pt believes that he is in the ED to receive vaccinations to go back to his hometown of [place], [place]. Counselor spoke to Wisner before assessment as a collateral contact, given the circumstances. Shantal shared that she, as well as pt's roommate, have noticed some behavioral changes in the pt in the past few weeks or so, but more specifically since this past Friday. Shantal reported that pt has become very isolative, has not been doing well in school (which is very unlike him), not eating or sleeping well, and talking to himself. Shantal added that she asked pt to spend the weekend with her and her family, due to his bizarre behaviors. She stated that, while there, he was isolative and stayed awake the entire night, taking to himself. Shantal also shared that, prior to this, pt called her and informed her that he was going to die and asked her to pray for him. Shantal additionally reported that, per his roommate, pt has not been sleeping at all, when at home.  :Patient is a 35 y  old AAM , with no past psychiatric hx, presented after having delusional thoughts as well as paranoia and depressed mood.  Will start a trial of Celexa 10 mg po daily for affective sx. Will add Haldol 5 mg po qhs for psychosis. Will add Cogentin 0.5 mg po qhs for EPS. Will continue Trazodone 50 mg po qhs for sleep.  Estimated length of stay: 4-5 days  New goal(s):  Review of initial/current patient goals per problem list:   Review of initial/current patient goals per problem list:  1. Goal(s): Patient will participate in aftercare plan   Met: Yes   Target date: 3-5 days post admission date   As evidenced by: Patient will participate within aftercare plan AEB aftercare provider and housing plan at discharge being identified. 07/12/15:  Plans to return home, follow up outpt   2. Goal (s): Patient will exhibit decreased depressive symptoms and suicidal ideations.   Met: No   Target date: 3-5 days post admission date   As evidenced by: Patient will utilize self rating of depression at 3 or below and demonstrate decreased signs of depression or be deemed stable for discharge by MD. 07/12/15:  Rates his depression a 6 today    3. Goal(s): Patient will demonstrate decreased signs and symptoms of anxiety.   Met: No   Target date: 3-5 days post admission date   As evidenced by: Patient will utilize self rating of anxiety at 3 or below and demonstrated decreased signs of anxiety, or be deemed stable for discharge by MD 07/12/15:  Rates his anxiety a 6 today    . Goal(s): Patient will demonstrate decreased signs of  psychosis  * Met: No  * Target date: 3-5 days post admission date  * As evidenced by: Patient will demonstrate decreased frequency of AVH or return to baseline function 07/12/15:  C/O hearing voices, confusion, and demonstrating paranoia         Attendees: Patient:  07/12/2015 8:14 AM   Family:   07/12/2015 8:14 AM   Physician:  Ursula Alert, MD  07/12/2015 8:14 AM   Nursing:   Alison Murray,  RN 07/12/2015 8:14 AM   CSW:    Roque Lias, LCSW   07/12/2015 8:14 AM   Other:  07/12/2015 8:14 AM   Other:   07/12/2015 8:14 AM   Other:  Lars Pinks, Nurse CM 07/12/2015 8:14 AM   Other:   07/12/2015 8:14 AM   Other:  Norberto Sorenson, P4CC  07/12/2015 8:14 AM   Other:  07/12/2015 8:14 AM   Other:  07/12/2015 8:14 AM   Other:  07/12/2015 8:14 AM   Other:  07/12/2015 8:14 AM   Other:  07/12/2015 8:14 AM   Other:   07/12/2015 8:14 AM    Scribe for Treatment Team:   Trish Mage, 07/12/2015 8:14 AM

## 2015-07-12 NOTE — [facility] Suicide Risk Assessment (Signed)
[facility]   Nursing information obtained from:    Demographic factors:    Current Mental Status:    Loss Factors:    Historical Factors:    Risk Reduction Factors:    Total Time spent with patient: 30 minutes Principal Problem: Psychosis Diagnosis:   Patient Active Problem List   Diagnosis Date Noted  . Psychosis [F29] 07/12/2015  . Depression [F32.9] 07/12/2015     Continued Clinical Symptoms:  Alcohol Use Disorder Identification Test Final Score (AUDIT): 0 The "Alcohol Use Disorders Identification Test", Guidelines for Use in Primary Care, Second Edition.  World Science writerHealth Organization [facility](WHO). Score between 0-7:  no or low risk or alcohol related problems. Score between 8-15:  moderate risk of alcohol related problems. Score between 16-19:  high risk of alcohol related problems. Score 20 or above:  warrants further diagnostic evaluation for alcohol dependence and treatment.   CLINICAL FACTORS:   Currently Psychotic   Musculoskeletal: Strength & Muscle Tone: within normal limits Gait & Station: normal Patient leans: N/A  Psychiatric Specialty Exam: Physical Exam  Review of Systems  Psychiatric/Behavioral: Positive for depression. The patient is nervous/anxious and has insomnia.   All other systems reviewed and are negative.   Blood pressure 130/80, pulse 87, temperature 99.1 F (37.3 C), temperature source Oral, resp. rate 20, height 5\' 9"  (1.753 m), weight 60.328 kg (133 lb).Body mass index is 19.63 kg/(m^2).                    Please see H&P.                                      COGNITIVE FEATURES THAT CONTRIBUTE TO RISK:  Closed-mindedness, Polarized thinking and Thought constriction (tunnel vision)    SUICIDE RISK:   Moderate:  Frequent suicidal ideation with limited intensity, and duration, some specificity in terms of plans, no associated intent, good self-control, limited dysphoria/symptomatology, some risk  factors present, and identifiable protective factors, including available and accessible social support.  PLAN OF CARE: Please see H&P.   Medical Decision Making:  Review of Psycho-Social Stressors (1), Review or order clinical lab tests (1), Review and summation of old records (2), Established Problem, Worsening (2), Review of Last Therapy Session (1), Review of Medication Regimen & Side Effects (2) and Review of New Medication or Change in Dosage (2)  I certify that inpatient services furnished can reasonably be expected to improve the patient's condition.   Nikyah Lackman md 07/12/2015, 11:05 AM

## 2015-07-12 NOTE — Progress Notes (Signed)
Adult Psychoeducational Group Note  Date:  07/12/2015 Time:  8:34 PM  Group Topic/Focus:  Wrap-Up Group:   The focus of this group is to help patients review their daily goal of treatment and discuss progress on daily workbooks.  Participation Level:  Active  Participation Quality:  Appropriate and Attentive  Affect:  Appropriate  Cognitive:  Appropriate  Insight: Good  Engagement in Group:  Engaged  Modes of Intervention:  Education  Additional Comments:  Pt was pleasant during wrap-up group. Pt stated that his day was wonderful. Pt also reported that he did not have a goal for the day. Pt noted that the highlight of his day was watching the Obama and Trump speech on TV.    Cleotilde NeerJasmine S Namine Beahm 07/12/2015, 9:06 PM

## 2015-07-13 DIAGNOSIS — E785 Hyperlipidemia, unspecified: Secondary | ICD-10-CM | POA: Clinically undetermined

## 2015-07-13 LAB — TSH: TSH: 2.611 u[IU]/mL (ref 0.350–4.500)

## 2015-07-13 LAB — LIPID PANEL
CHOL/HDL RATIO: 4.6 ratio
Cholesterol: 252 mg/dL — ABNORMAL HIGH (ref 0–200)
HDL: 55 mg/dL (ref >40)
LDL CALC: 181 mg/dL — AB (ref 0–99)
TRIGLYCERIDES: 79 mg/dL (ref <150)
VLDL: 16 mg/dL (ref 0–40)

## 2015-07-13 MED ORDER — HALOPERIDOL 5 MG PO TABS
7.5000 mg | ORAL_TABLET | Freq: Every day | ORAL | Status: DC
  Administered 2015-07-13: 7.5 mg via ORAL
  Filled 2015-07-13 (×2): qty 1

## 2015-07-13 NOTE — [facility] Group Notes (Signed)
[facility] LCSW Group Therapy  07/13/2015 , 2:49 PM   Type of Therapy:  Group Therapy  Participation Level:  Active  Participation Quality:  Attentive  Affect:  Appropriate  Cognitive:  Alert  Insight:  Improving  Engagement in Therapy:  Engaged  Modes of Intervention:  Discussion, Exploration and Socialization  Summary of Progress/Problems: Today's group focused on the term Diagnosis.  Participants were asked to define the term, and then pronounce whether it is a negative, positive or neutral term.  Stayed the whole time.  Attentive throughout.  Stated his main symptom is confusion, and that he feels he is getting better with the help of medication.  Stated he understands this is a behavioral health hospital, and that is what he is getting treated for.  Daryel Geraldorth, Haunani Dickard B 07/13/2015 , 2:49 PM

## 2015-07-13 NOTE — Progress Notes (Signed)
DAR NOTE: Patient presents with anxious affect and depressed mood.  Denies pain, auditory and visual hallucinations.  Rates depression at 4, hopelessness at 5, and anxiety at 5.  Maintained on routine safety checks.  Medications given as prescribed.  Support and encouragement offered as needed.  Attended group and participated.  States goal for today is "sleep good.".  Patient observed socializing with peers in the dayroom.  Pt's safety ensured with 15 minute and environmental checks. Pt currently denies SI/HI and A/V hallucinations. Pt verbally agrees to seek staff if SI/HI or A/VH occurs and to consult with staff before acting on these thoughts. Will continue POC.

## 2015-07-13 NOTE — [facility] Group Notes (Signed)
[facility] Group Notes:  (Nursing/MHT/Case Management/Adjunct)  Date:  07/13/2015  Time:  4:20 PM   Type of Therapy:  Psychoeducational Skills  Participation Level:  Active  Participation Quality:  Appropriate  Affect:  Appropriate  Cognitive:  Appropriate  Insight:  Appropriate  Engagement in Group:  Engaged  Modes of Intervention:  Problem-solving  Summary of Progress/Problems: Summary of Progress/Problems: Topic was on leisure and lifestyle changes. Discussed the important of choosing healthy leisure activities. Group encouraged to surround themselves with positive and healthy group/support system when changing to a healthy life style.    Peter PunchesJane O Canuto Golden 07/13/2015, 4:20 PM

## 2015-07-13 NOTE — Progress Notes (Signed)
D. Pt had been up and visible in milieu this evening, had visit from family members this morning that went well. Pt spoke of his day and spoke about feeling better today but does still have some paranoia in his conversation when talking about his family. Pt did receive bedtime medications without incident and did not verbalize any complaints of pain. A. Support and encouragement provided. R. Safety maintained, will continue to monitor.

## 2015-07-13 NOTE — Progress Notes (Signed)
NUTRITION NOTE  Consult for hyperlipidemia education received. Per protocol, pt to be provided with packet provided to RNs from RDs to distribute to patients who require this teaching. Packet contains information pertaining to hyperlipidemia as well as general healthy eating with a focus on MyPlate.   Please re-consult should further nutrition-related issues arise.    Trenton GammonJessica Eual Lindstrom, RD, LDN Inpatient Clinical Dietitian Pager # [phone] After hours/weekend pager # [phone]

## 2015-07-13 NOTE — Progress Notes (Signed)
[facility] MD Progress Note  07/13/2015 2:38 PM Peter Golden  MRN:  [id number] Subjective: Patient states " I am better, I still have those thoughts , I just woke up , they may be getting low.'  Objective:Peter Golden is a 35 y.o. AA male, single, student at [employer], denies hx of past mental illness or medical problems, presented to [facility] voluntarily under the request of his friend, [name] [phone]). Pt was thereafter admitted to [facility] for further management.Pt on admission was delusional, paranoid, depressed and had sleep issues.  Patient seen and chart reviewed.Discussed patient with treatment team.  Pt today appears calm and cooperative , but continues to have paranoia as well as delusional thoughts. Pt continues to believe that his uncle is doing vodoo against him and he has racing thoughts and anxiety about it. Pt reports sleep as improved last night. Per staff, pt is tolerating medications well. Will continue to encourage and support.        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 Hyperlipidemia [E78.5] 07/13/2015  . Psychosis [F29] 07/12/2015  . Depression [F32.9] 07/12/2015   Total Time spent with patient: 30 minutes  Past Psychiatric History: Pt denies past hx of mental illness or suicide attempts  Past Medical History: see H&p Family History: please see H&P. Family History  Problem Relation Age of Onset  . Mental illness Neg Hx    Family Psychiatric  History: Pt denies hx of mental illness, drug abuse or suicide in family. Social History: Pt is single , originally from [place], [place]. Pt currently lives in [place] , student at [employer] , living in [place] since the past 3 years . Pt denies any legal issues. Pt is religious   History  Alcohol Use No     History  Drug Use No    Social History   Social History  .  Marital Status: Single    Spouse Name: N/A  . Number of Children: N/A  . Years of Education: N/A   Social History Main Topics  . Smoking status: Never Smoker   . Smokeless tobacco: Never Used  . Alcohol Use: No  . Drug Use: No  . Sexual Activity: Not Asked   Other Topics Concern  . None   Social History Narrative   Additional Social History:                         Sleep: Fair  Appetite:  Fair  Current Medications: Current Facility-Administered Medications  Medication Dose Route Frequency Provider Last Rate Last Dose  . acetaminophen (TYLENOL) tablet 650 mg  650 mg Oral Q6H PRN Kerry Hough, PA-C      . alum & mag hydroxide-simeth (MAALOX/MYLANTA) 200-200-20 MG/5ML suspension 30 mL  30 mL Oral Q4H PRN Kerry Hough, PA-C      . benztropine (COGENTIN) tablet 0.5 mg  0.5 mg Oral QHS Jomarie Longs, MD   0.5 mg at 07/12/15 2123  . citalopram (CELEXA) tablet 10 mg  10 mg Oral Daily Jomarie Longs, MD   10 mg at 07/13/15 0803  . haloperidol (HALDOL) tablet 7.5 mg  7.5 mg Oral QHS Gladine Plude, MD      . hydrOXYzine (ATARAX/VISTARIL) tablet 25 mg  25 mg Oral Q6H PRN Kerry Hough, PA-C      . risperiDONE (  RISPERDAL M-TABS) disintegrating tablet 2 mg  2 mg Oral Q8H PRN Kerry Hough, PA-C       And  . LORazepam (ATIVAN) tablet 1 mg  1 mg Oral PRN Kerry Hough, PA-C       And  . ziprasidone (GEODON) injection 20 mg  20 mg Intramuscular PRN Kerry Hough, PA-C      . magnesium hydroxide (MILK OF MAGNESIA) suspension 30 mL  30 mL Oral Daily PRN Kerry Hough, PA-C      . traZODone (DESYREL) tablet 50 mg  50 mg Oral QHS,MR X 1 Kerry Hough, PA-C   50 mg at 07/12/15 2123    Lab Results:  Results for orders placed or performed during the hospital encounter of 07/11/15 (from the past 48 hour(s))  TSH     Status: None   Collection Time: 07/13/15  6:25 AM  Result Value Ref Range   TSH 2.611 0.350 - 4.500 uIU/mL    Comment: Performed at [facility]  Lipid panel     Status: Abnormal   Collection Time: 07/13/15  6:25 AM  Result Value Ref Range   Cholesterol 252 (H) 0 - 200 mg/dL   Triglycerides 79 <161 mg/dL   HDL 55 >09 mg/dL   Total CHOL/HDL Ratio 4.6 RATIO   VLDL 16 0 - 40 mg/dL   LDL Cholesterol 604 (H) 0 - 99 mg/dL    Comment:        Total Cholesterol/HDL:CHD Risk Coronary Heart Disease Risk Table                     Men   Women  1/2 Average Risk   3.4   3.3  Average Risk       5.0   4.4  2 X Average Risk   9.6   7.1  3 X Average Risk  23.4   11.0        Use the calculated Patient Ratio above and the CHD Risk Table to determine the patient's CHD Risk.        ATP III CLASSIFICATION (LDL):  <100     mg/dL   Optimal  540-981  mg/dL   Near or Above                    Optimal  130-159  mg/dL   Borderline  191-478  mg/dL   High  >295     mg/dL   Very High Performed at [facility]     Physical Findings: AIMS: Facial and Oral Movements Muscles of Facial Expression: None, normal Lips and Perioral Area: None, normal Jaw: None, normal Tongue: None, normal,Extremity Movements Upper (arms, wrists, hands, fingers): None, normal Lower (legs, knees, ankles, toes): None, normal, Trunk Movements Neck, shoulders, hips: None, normal, Overall Severity Severity of abnormal movements (highest score from questions above): None, normal Incapacitation due to abnormal movements: None, normal Patient's awareness of abnormal movements (rate only patient's report): No Awareness, Dental Status Current problems with teeth and/or dentures?: No Does patient usually wear dentures?: No  CIWA:    COWS:     Musculoskeletal: Strength & Muscle Tone: within normal limits Gait & Station: normal Patient leans: N/A  Psychiatric Specialty Exam: Review of Systems  Psychiatric/Behavioral: Positive for depression. The patient is nervous/anxious.   All other systems reviewed and are negative.   Blood pressure 107/76,  pulse 92, temperature 97.7 F (36.5 C), temperature source Oral,  resp. rate 18, height 5\' 9"  (1.753 m), weight 60.328 kg (133 lb).Body mass index is 19.63 kg/(m^2).  General Appearance: Fairly Groomed  Patent attorneyye Contact::  Fair  Speech:  Clear and Coherent  Volume:  Normal  Mood:  Anxious  Affect:  Congruent  Thought Process:  Irrelevant  Orientation:  Full (Time, Place, and Person)  Thought Content:  Delusions, Paranoid Ideation and Rumination  Suicidal Thoughts:  No  Homicidal Thoughts:  No  Memory:  Immediate;   Fair Recent;   Fair Remote;   Fair  Judgement:  Impaired  Insight:  Shallow  Psychomotor Activity:  Normal  Concentration:  Poor  Recall:  FiservFair  Fund of Knowledge:Fair  Language: Fair  Akathisia:  No  Handed:  Right  AIMS (if indicated):     Assets:  Desire for Improvement  ADL's:  Intact  Cognition: WNL  Sleep:  Number of Hours: 6.5   Treatment Plan Summary:Patient presented with depression and psychosis. Will continue treatment.  Daily contact with patient to assess and evaluate symptoms and progress in treatment and Medication management Will continue Celexa 10 mg po daily for affective sx. Will increase Haldol to 7.5 mg po qhs for psychosis. Will continue Cogentin 0.5 mg po qhs for EPS. Will continue Trazodone 50 mg po qhs for sleep. Will continue PRN medications as per agitation protocol. Will continue to monitor vitals ,medication compliance and treatment side effects while patient is here.  Will monitor for medical issues as well as call consult as needed.  Reviewed labs TSH - wnl, Lipid panel - abnormal - will consult dietician. CSW will start working on disposition.  Patient to participate in therapeutic milieu .   Shainna Faux md 07/13/2015, 2:38 PM

## 2015-07-14 DIAGNOSIS — E221 Hyperprolactinemia: Secondary | ICD-10-CM | POA: Clinically undetermined

## 2015-07-14 DIAGNOSIS — F323 Major depressive disorder, single episode, severe with psychotic features: Principal | ICD-10-CM | POA: Clinically undetermined

## 2015-07-14 LAB — PROLACTIN: Prolactin: 47.9 ng/mL — ABNORMAL HIGH (ref 4.0–15.2)

## 2015-07-14 LAB — HEMOGLOBIN A1C
HEMOGLOBIN A1C: 5.5 % (ref 4.8–5.6)
Mean Plasma Glucose: 111 mg/dL

## 2015-07-14 MED ORDER — HALOPERIDOL 5 MG PO TABS
10.0000 mg | ORAL_TABLET | Freq: Every day | ORAL | Status: DC
  Administered 2015-07-14 – 2015-07-17 (×4): 10 mg via ORAL
  Filled 2015-07-14 (×5): qty 2
  Filled 2015-07-14: qty 14

## 2015-07-14 NOTE — Progress Notes (Signed)
D) Pt has been attending the groups with his interpreter. Affect is flat, mood anxious. Pt states that when he first came in he was paranoid that there were people trying to kill him. Pt states that he no longer feels that as intensely. States he is feeling much better than when he first came in. Pt is slightly hesitant in his responses. Rates his depression at a 5, hopelessness at a 5 and his anxiety at a 5. Denies SI and HI. When not in group, Pt tends to stay to himself. A) Given support, reassurance and praise. Encouragement given to Pt. Along with reassurance that his safety will be maintained. Provided with a brief 1:1 with the interpreter.  R) Pt denies SI and HI.

## 2015-07-14 NOTE — Progress Notes (Signed)
D. Pt had been up and visible in milieu this evening, minimal interaction or participation however. Pt retired to his room early this evening and spoke about how he has been sleeping well but feels that he needs to get some more rest and believes that this will be beneficial for him. Pt did receive all medications without incident and did not verbalize any complaints of pain. A. Support and encouragement provided. R. Safety maintained, will continue to monitor.

## 2015-07-14 NOTE — [facility] Group Notes (Signed)
[facility] LCSW Group Therapy  07/14/2015  1:05 PM  Type of Therapy:  Group therapy  Participation Level:  Active  Participation Quality:  Attentive  Affect:  Flat  Cognitive:  Oriented  Insight:  Limited  Engagement in Therapy:  Limited  Modes of Intervention:  Discussion, Socialization  Summary of Progress/Problems:  Chaplain was here to lead a group on themes of hope and courage. "Courage is the ability to work hard towards your goals.  I want to be an accountant, and don't want anything to side track me from that." Listened attentively to what others were saying through interpreter.  Peter Golden, Peter Golden 07/14/2015 1:21 PM

## 2015-07-14 NOTE — Progress Notes (Signed)
[facility] MD Progress Note  07/14/2015 1:12 PM Peter Golden  MRN:  [id number] Subjective: Patient states " I am still having some thoughts , some anxiety.'   Objective:Lavaris Kokou Happe is a 35 y.o. AA male, single, student at [employer], denies hx of past mental illness or medical problems, presented to [facility] voluntarily under the request of his friend, [name] [phone]). Pt was thereafter admitted to [facility] for further management.Pt on admission was delusional, paranoid, depressed and had sleep issues.  Patient seen and chart reviewed.Discussed patient with treatment team.  Pt today appears less anxious , reports his delusional thoughts as improving, but he does still have paranoia and persecutory thoughts that his uncle will hurt him. Pt per staff has been tolerating his medications well. Denies side effects.         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 Hyperlipidemia [E78.5] 07/13/2015   Total Time spent with patient: 25 minutes  Past Psychiatric History: Pt denies past hx of mental illness or suicide attempts  Past Medical History: see H&p Family History: please see H&P. Family History  Problem Relation Age of Onset  . Mental illness Neg Hx    Family Psychiatric  History: Pt denies hx of mental illness, drug abuse or suicide in family. Social History: Pt is single , originally from [place], [place]. Pt currently lives in [place] , student at [employer] , living in [place] since the past 3 years . Pt denies any legal issues. Pt is religious   History  Alcohol Use No     History  Drug Use No    Social History   Social  History  . Marital Status: Single    Spouse Name: N/A  . Number of Children: N/A  . Years of Education: N/A   Social History Main Topics  . Smoking status: Never Smoker   . Smokeless tobacco: Never Used  . Alcohol Use: No  . Drug Use: No  . Sexual Activity: Not Asked   Other Topics Concern  . None   Social History Narrative   Additional Social History:                         Sleep: Fair  Appetite:  Fair  Current Medications: Current Facility-Administered Medications  Medication Dose Route Frequency Provider Last Rate Last Dose  . acetaminophen (TYLENOL) tablet 650 mg  650 mg Oral Q6H PRN Kerry Hough, PA-C      . alum & mag hydroxide-simeth (MAALOX/MYLANTA) 200-200-20 MG/5ML suspension 30 mL  30 mL Oral Q4H PRN Kerry Hough, PA-C      . benztropine (COGENTIN) tablet 0.5 mg  0.5 mg Oral QHS Jomarie Longs, MD   0.5 mg at 07/13/15 2127  . citalopram (CELEXA) tablet 10 mg  10 mg Oral Daily Jomarie Longs, MD   10 mg at 07/14/15 0842  . haloperidol (HALDOL) tablet 7.5 mg  7.5 mg Oral QHS Willi Borowiak, MD   7.5 mg at 07/13/15 2127  . hydrOXYzine (ATARAX/VISTARIL) tablet 25 mg  25 mg Oral Q6H PRN Kerry Hough, PA-C      . risperiDONE (RISPERDAL M-TABS) disintegrating tablet 2  mg  2 mg Oral Q8H PRN Kerry Hough, PA-C       And  . LORazepam (ATIVAN) tablet 1 mg  1 mg Oral PRN Kerry Hough, PA-C       And  . ziprasidone (GEODON) injection 20 mg  20 mg Intramuscular PRN Kerry Hough, PA-C      . magnesium hydroxide (MILK OF MAGNESIA) suspension 30 mL  30 mL Oral Daily PRN Kerry Hough, PA-C      . traZODone (DESYREL) tablet 50 mg  50 mg Oral QHS,MR X 1 Kerry Hough, PA-C   50 mg at 07/13/15 2127    Lab Results:  Results for orders placed or performed during the hospital encounter of 07/11/15 (from the past 48 hour(s))  TSH     Status: None   Collection Time: 07/13/15  6:25 AM  Result Value Ref Range   TSH 2.611 0.350 - 4.500 uIU/mL     Comment: Performed at [facility]  Hemoglobin A1c     Status: None   Collection Time: 07/13/15  6:25 AM  Result Value Ref Range   Hgb A1c MFr Bld 5.5 4.8 - 5.6 %    Comment: (NOTE)         Pre-diabetes: 5.7 - 6.4         Diabetes: >6.4         Glycemic control for adults with diabetes: <7.0    Mean Plasma Glucose 111 mg/dL    Comment: (NOTE) Performed At: [facility] [address] [place], [place] [id number] Mila Homer MD WU:[id number] Performed at [facility]   Lipid panel     Status: Abnormal   Collection Time: 07/13/15  6:25 AM  Result Value Ref Range   Cholesterol 252 (H) 0 - 200 mg/dL   Triglycerides 79 <956 mg/dL   HDL 55 >21 mg/dL   Total CHOL/HDL Ratio 4.6 RATIO   VLDL 16 0 - 40 mg/dL   LDL Cholesterol 308 (H) 0 - 99 mg/dL    Comment:        Total Cholesterol/HDL:CHD Risk Coronary Heart Disease Risk Table                     Men   Women  1/2 Average Risk   3.4   3.3  Average Risk       5.0   4.4  2 X Average Risk   9.6   7.1  3 X Average Risk  23.4   11.0        Use the calculated Patient Ratio above and the CHD Risk Table to determine the patient's CHD Risk.        ATP III CLASSIFICATION (LDL):  <100     mg/dL   Optimal  657-846  mg/dL   Near or Above                    Optimal  130-159  mg/dL   Borderline  962-952  mg/dL   High  >841     mg/dL   Very High Performed at [facility]   Prolactin     Status: Abnormal   Collection Time: 07/13/15  6:25 AM  Result Value Ref Range   Prolactin 47.9 (H) 4.0 - 15.2 ng/mL    Comment: (NOTE) Performed At: [facility] [address] [place], [place] [id number] Mila Homer MD OZ:[id number] Performed at [facility]     Physical Findings: AIMS: Facial and Oral Movements Muscles of Facial Expression: None, normal Lips and Perioral Area: None, normal Jaw: None, normal Tongue: None, normal,Extremity Movements Upper  (arms, wrists, hands, fingers): None, normal Lower (legs, knees, ankles, toes): None, normal, Trunk Movements Neck, shoulders, hips: None, normal, Overall Severity Severity of abnormal movements (highest score from questions above): None, normal Incapacitation due to abnormal movements: None, normal Patient's awareness of abnormal movements (rate only patient's report): No Awareness, Dental Status Current problems with teeth and/or dentures?: No Does patient usually wear dentures?: No  CIWA:    COWS:     Musculoskeletal: Strength & Muscle Tone: within normal limits Gait & Station: normal Patient leans: N/A  Psychiatric Specialty Exam: Review of Systems  Psychiatric/Behavioral: Positive for depression. The patient is nervous/anxious.   All other systems reviewed and are negative.   Blood pressure 108/68, pulse 122, temperature 97.9 F (36.6 C), temperature source Oral, resp. rate 16, height 5\' 9"  (1.753 m), weight 60.328 kg (133 lb).Body mass index is 19.63 kg/(m^2).  General Appearance: Fairly Groomed  Patent attorneyye Contact::  Fair  Speech:  Clear and Coherent  Volume:  Normal  Mood:  Anxious  Affect:  Congruent  Thought Process:  Irrelevant  Orientation:  Full (Time, Place, and Person)  Thought Content:  Delusions, Paranoid Ideation and Rumination  Suicidal Thoughts:  No  Homicidal Thoughts:  No  Memory:  Immediate;   Fair Recent;   Fair Remote;   Fair  Judgement:  Impaired  Insight:  Shallow  Psychomotor Activity:  Normal  Concentration:  Poor  Recall:  FiservFair  Fund of Knowledge:Fair  Language: Fair  Akathisia:  No  Handed:  Right  AIMS (if indicated):     Assets:  Desire for Improvement  ADL's:  Intact  Cognition: WNL  Sleep:  Number of Hours: 6.75   Treatment Plan Summary:Patient presented with depression and psychosis. Pt continues to progress. Will continue treatment.  Daily contact with patient to assess and evaluate symptoms and progress in treatment and Medication  management Will continue Celexa 10 mg po daily for affective sx. Will increase Haldol to 10  mg po qhs for psychosis. Will continue Cogentin 0.5 mg po qhs for EPS. Will continue Trazodone 50 mg po qhs for sleep. Will continue PRN medications as per agitation protocol. Will continue to monitor vitals ,medication compliance and treatment side effects while patient is here.  Will monitor for medical issues as well as call consult as needed.  Reviewed labs TSH - wnl, Lipid panel - abnormal - consulted dietician.PL - elevated - could monitor on an out pt basis. CSW will start working on disposition.  Patient to participate in therapeutic milieu .   Tylasia Fletchall md 07/14/2015, 1:12 PM

## 2015-07-14 NOTE — [facility] Group Notes (Signed)
[facility] LCSW Aftercare Discharge Planning Group Note   07/14/2015 11:50 AM  Participation Quality:  Engaged  Mood/Affect:  Appropriate  Depression Rating:    Anxiety Rating:    Thoughts of Suicide:  No Will you contract for safety?   NA  Current AVH:  No  Plan for Discharge/Comments:  Happy that he had a visit last night from [place].  Says he met her at church.  Not only are they both from [place], but they are members of the same tribe.  He is glad to have an interpreter coming later today.  No other concerns.  Transportation Means:   Supports:  Roque Lias B

## 2015-07-15 NOTE — Progress Notes (Signed)
[facility] MD Progress Note  07/15/2015 3:03 PM Peter Golden  MRN:  [id number] Subjective: Patient states " I am better.'   Objective:Peter Golden is a 35 y.o. AA male, single, student at [employer], denies hx of past mental illness or medical problems, presented to [facility] voluntarily under the request of his friend, [name] [phone]). Pt was thereafter admitted to [facility] for further management.Pt on admission was delusional, paranoid, depressed and had sleep issues.  Patient seen and chart reviewed.Discussed patient with treatment team.  Pt today appears less anxious , less paranoid , reports sleep as improving. Pt per staff has been tolerating his medications well. Denies side effects.         Principal Problem: MDD (major depressive disorder), single episode, severe with psychosis (HCC)     Diagnosis:   Patient Active Problem List   Diagnosis Date Noted  . 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 Hyperlipidemia [E78.5] 07/13/2015   Total Time spent with patient: 25 minutes  Past Psychiatric History: Pt denies past hx of mental illness or suicide attempts  Past Medical History: see H&p Family History: please see H&P. Family History  Problem Relation Age of Onset  . Mental illness Neg Hx    Family Psychiatric  History: Pt denies hx of mental illness, drug abuse or suicide in family. Social History: Pt is single , originally from [place], [place]. Pt currently lives in [place] , student at [employer] , living in [place] since the past 3 years . Pt denies any legal issues. Pt is religious   History  Alcohol Use No     History  Drug Use No    Social History   Social History  . Marital Status: Single    Spouse Name: N/A  . Number of Children: N/A  . Years of Education: N/A   Social History Main Topics  . Smoking status: Never Smoker   . Smokeless tobacco: Never Used  .  Alcohol Use: No  . Drug Use: No  . Sexual Activity: Not Asked   Other Topics Concern  . None   Social History Narrative   Additional Social History:                         Sleep: Fair  Appetite:  Fair  Current Medications: Current Facility-Administered Medications  Medication Dose Route Frequency Provider Last Rate Last Dose  . acetaminophen (TYLENOL) tablet 650 mg  650 mg Oral Q6H PRN Kerry Hough, PA-C      . alum & mag hydroxide-simeth (MAALOX/MYLANTA) 200-200-20 MG/5ML suspension 30 mL  30 mL Oral Q4H PRN Kerry Hough, PA-C      . benztropine (COGENTIN) tablet 0.5 mg  0.5 mg Oral QHS Jomarie Longs, MD   0.5 mg at 07/14/15 2103  . citalopram (CELEXA) tablet 10 mg  10 mg Oral Daily Jomarie Longs, MD   10 mg at 07/15/15 0810  . haloperidol (HALDOL) tablet 10 mg  10 mg Oral QHS Jomarie Longs, MD   10 mg at 07/14/15 2102  . hydrOXYzine (ATARAX/VISTARIL) tablet 25 mg  25 mg Oral Q6H PRN Kerry Hough, PA-C      . risperiDONE (RISPERDAL M-TABS) disintegrating tablet 2 mg  2 mg Oral Q8H PRN Kerry Hough, PA-C       And  . LORazepam (ATIVAN) tablet 1 mg  1 mg Oral PRN Mena Goes  Simon, PA-C       And  . ziprasidone (GEODON) injection 20 mg  20 mg Intramuscular PRN Kerry HoughSpencer E Simon, PA-C      . magnesium hydroxide (MILK OF MAGNESIA) suspension 30 mL  30 mL Oral Daily PRN Kerry HoughSpencer E Simon, PA-C      . traZODone (DESYREL) tablet 50 mg  50 mg Oral QHS,MR X 1 Kerry HoughSpencer E Simon, PA-C   50 mg at 07/14/15 2103    Lab Results:  No results found for this or any previous visit (from the past 48 hour(s)).  Physical Findings: AIMS: Facial and Oral Movements Muscles of Facial Expression: None, normal Lips and Perioral Area: None, normal Jaw: None, normal Tongue: None, normal,Extremity Movements Upper (arms, wrists, hands, fingers): None, normal Lower (legs, knees, ankles, toes): None, normal, Trunk Movements Neck, shoulders, hips: None, normal, Overall Severity Severity  of abnormal movements (highest score from questions above): None, normal Incapacitation due to abnormal movements: None, normal Patient's awareness of abnormal movements (rate only patient's report): No Awareness, Dental Status Current problems with teeth and/or dentures?: No Does patient usually wear dentures?: No  CIWA:    COWS:     Musculoskeletal: Strength & Muscle Tone: within normal limits Gait & Station: normal Patient leans: N/A  Psychiatric Specialty Exam: Review of Systems  Psychiatric/Behavioral: Positive for depression. The patient is nervous/anxious.   All other systems reviewed and are negative.   Blood pressure 121/60, pulse 125, temperature 98.4 F (36.9 C), temperature source Oral, resp. rate 18, height 5\' 9"  (1.753 m), weight 60.328 kg (133 lb).Body mass index is 19.63 kg/(m^2).  General Appearance: Fairly Groomed  Patent attorneyye Contact::  Fair  Speech:  Clear and Coherent  Volume:  Normal  Mood:  Anxious  Affect:  Congruent  Thought Process:  Irrelevant  Orientation:  Full (Time, Place, and Person)  Thought Content:  Delusions, Paranoid Ideation and Rumination improving  Suicidal Thoughts:  No  Homicidal Thoughts:  No  Memory:  Immediate;   Fair Recent;   Fair Remote;   Fair  Judgement:  Impaired  Insight:  Shallow  Psychomotor Activity:  Normal  Concentration:  Poor  Recall:  FiservFair  Fund of Knowledge:Fair  Language: Fair  Akathisia:  No  Handed:  Right  AIMS (if indicated):     Assets:  Desire for Improvement  ADL's:  Intact  Cognition: WNL  Sleep:  Number of Hours: 6.75   Treatment Plan Summary:Patient presented with depression and psychosis. Pt continues to progress. Will continue treatment.  Daily contact with patient to assess and evaluate symptoms and progress in treatment and Medication management Will continue Celexa 10 mg po daily for affective sx. Will continue  Haldol to 10  mg po qhs for psychosis. Will continue Cogentin 0.5 mg po qhs for  EPS. Will continue Trazodone 50 mg po qhs for sleep. Will continue PRN medications as per agitation protocol. Will continue to monitor vitals ,medication compliance and treatment side effects while patient is here.  Will monitor for medical issues as well as call consult as needed.  Reviewed labs TSH - wnl, Lipid panel - abnormal - consulted dietician.PL - elevated - could monitor on an out pt basis. CSW will start working on disposition.  Patient to participate in therapeutic milieu .   Karisma Meiser md 07/15/2015, 3:03 PM

## 2015-07-15 NOTE — Progress Notes (Signed)
Adult Psychoeducational Group Note  Date:  07/15/2015 Time:  8:18 PM  Group Topic/Focus:  Wrap-Up Group:   The focus of this group is to help patients review their daily goal of treatment and discuss progress on daily workbooks.  Participation Level:  Did Not Attend   Cleotilde NeerJasmine S Prospero Mahnke 07/15/2015, 8:37 PM

## 2015-07-15 NOTE — Progress Notes (Signed)
Patient has been isolative to his room this evening and did not attend group this evening. and has voiced no complaints. Patient currently denies having pain, -si/hi/a/v hall. Support and encouragement offered, safety maintained on unit, will continue to monitor.

## 2015-07-15 NOTE — Progress Notes (Signed)
Pt observed in the dayroom sitting at the back of the room, with no interaction with his peers.  He denies SI/HI/AVH.  He did not appear paranoid, but did seem anxious.  He reported that his main concern this evening is to get the rest and sleep that he needs.  He requested his medications early, but Clinical research associatewriter encouraged him to wait until at least 2100.  He was encouraged to make his needs known to staff.  Pt voices no other needs or concerns at this time.  Support and encouragement offered.  Safety maintained with q15 minute checks.

## 2015-07-15 NOTE — Progress Notes (Signed)
D) Pt has spent most of the day in the bed resting. States "I am doing better". Denies SI and HI. Delusions and hallucinations. States, :"I am not feeling like anyone wants to kill me anymore". Pt's affect continues to remain flat. Rtes his depression at a 3, hopelessness at a 6 and his anxiety at a 0. A) Pt given support, reassurance and praise. Provided with a brief 1:1. Encouragement given R) Pt denies SI and HI.

## 2015-07-15 NOTE — [facility] Group Notes (Signed)
[facility] Group Notes: (Clinical Social Work)   07/15/2015      Type of Therapy:  Group Therapy   Participation Level:  Did Not Attend despite MHT prompting   Ambrose MantleMareida Grossman-Orr, LCSW 07/15/2015, 12:25 PM

## 2015-07-16 MED ORDER — CITALOPRAM HYDROBROMIDE 10 MG PO TABS
15.0000 mg | ORAL_TABLET | Freq: Every day | ORAL | Status: DC
  Administered 2015-07-17 – 2015-07-18 (×2): 15 mg via ORAL
  Filled 2015-07-16 (×2): qty 2
  Filled 2015-07-16: qty 11
  Filled 2015-07-16: qty 2

## 2015-07-16 NOTE — [facility] Group Notes (Signed)
[facility] Group Notes:  (Nursing/MHT/Case Management/Adjunct)  Date:  07/16/2015  Time:  0930  Type of Therapy:  Nurse Education  Participation Level:  Did Not Attend  Summary of Progress/Problems: Pt remained in bed asleep after invitation to attend. Interpreter was present during group.   Maurine SimmeringShugart, Grecia Lynk M 07/16/2015, 11:59 AM

## 2015-07-16 NOTE — Progress Notes (Signed)
D: Speaking with an interpreter present, Chance denied SI/HI/AVH/pain. He remained in bed for much of the a.m but is now sitting in the dayroom. He is calm/cooperative/pleasant. He reported good sleep/appetite, normal energy level, and poor concentration. On scale with 10=worst, he rated his depression a 5, hopelessness 8, and anxiety 7. He said his goal is to "get out of here and go back to school," according to the interpreter. He will do this by taking medications and reading the Bible. Scherrie MerrittsGbedey appears to understand a fair amount of [place].  A: Meds given as ordered. Q15 safety checks maintained. Support/encouragement offered. R: Pt remains free from harm and continues with treatment. Will continue to monitor for needs/safety.

## 2015-07-16 NOTE — Progress Notes (Signed)
[facility] MD Progress Note  07/16/2015 9:43 AM Peter Golden  MRN:  [id number] Subjective: Patient states " I am OK."   Objective:Peter Golden is a 35 y.o. AA male, single, student at [employer], denies hx of past mental illness or medical problems, presented to [facility] voluntarily under the request of his friend, [name] [phone]). Pt was thereafter admitted to [facility] for further management.Pt on admission was delusional, paranoid, depressed and had sleep issues.  Patient seen and chart reviewed.Discussed patient with treatment team.  Pt today seen in bed , appears withdrawn , flat , isolative. Pt reports anxiety and paranoia as improving. Per staff - has been compliant on medications. Denies ADRs. Pt encouraged to attend groups and participate in milieu.      Principal Problem: MDD (major depressive disorder), single episode, severe with psychosis (HCC)     Diagnosis:   Patient Active Problem List   Diagnosis Date Noted  . 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 Hyperlipidemia [E78.5] 07/13/2015   Total Time spent with patient: 25 minutes  Past Psychiatric History: Pt denies past hx of mental illness or suicide attempts  Past Medical History: see H&p Family History: please see H&P. Family History  Problem Relation Age of Onset  . Mental illness Neg Hx    Family Psychiatric  History: Pt denies hx of mental illness, drug abuse or suicide in family. Social History: Pt is single , originally from [place], [place]. Pt currently lives in [place] , student at [employer] , living in [place] since the past 3 years . Pt denies any legal issues. Pt is religious   History  Alcohol Use No     History  Drug Use No    Social History   Social History  . Marital Status: Single    Spouse Name: N/A  . Number of Children: N/A  . Years of Education: N/A   Social History Main Topics  .  Smoking status: Never Smoker   . Smokeless tobacco: Never Used  . Alcohol Use: No  . Drug Use: No  . Sexual Activity: Not Asked   Other Topics Concern  . None   Social History Narrative   Additional Social History:                         Sleep: Fair  Appetite:  Fair  Current Medications: Current Facility-Administered Medications  Medication Dose Route Frequency Provider Last Rate Last Dose  . acetaminophen (TYLENOL) tablet 650 mg  650 mg Oral Q6H PRN Kerry Hough, PA-C      . alum & mag hydroxide-simeth (MAALOX/MYLANTA) 200-200-20 MG/5ML suspension 30 mL  30 mL Oral Q4H PRN Kerry Hough, PA-C      . benztropine (COGENTIN) tablet 0.5 mg  0.5 mg Oral QHS Jomarie Longs, MD   0.5 mg at 07/15/15 2134  . citalopram (CELEXA) tablet 10 mg  10 mg Oral Daily Nikalas Bramel, MD   10 mg at 07/16/15 0804  . haloperidol (HALDOL) tablet 10 mg  10 mg Oral QHS Jomarie Longs, MD   10 mg at 07/15/15 2133  . hydrOXYzine (ATARAX/VISTARIL) tablet 25 mg  25 mg Oral Q6H PRN Kerry Hough, PA-C      . risperiDONE (RISPERDAL M-TABS) disintegrating tablet 2 mg  2 mg Oral Q8H PRN Kerry Hough, PA-C       And  . LORazepam (ATIVAN)  tablet 1 mg  1 mg Oral PRN Kerry HoughSpencer E Simon, PA-C       And  . ziprasidone (GEODON) injection 20 mg  20 mg Intramuscular PRN Kerry HoughSpencer E Simon, PA-C      . magnesium hydroxide (MILK OF MAGNESIA) suspension 30 mL  30 mL Oral Daily PRN Kerry HoughSpencer E Simon, PA-C      . traZODone (DESYREL) tablet 50 mg  50 mg Oral QHS,MR X 1 Kerry HoughSpencer E Simon, PA-C   50 mg at 07/15/15 2134    Lab Results:  No results found for this or any previous visit (from the past 48 hour(s)).  Physical Findings: AIMS: Facial and Oral Movements Muscles of Facial Expression: None, normal Lips and Perioral Area: None, normal Jaw: None, normal Tongue: None, normal,Extremity Movements Upper (arms, wrists, hands, fingers): None, normal Lower (legs, knees, ankles, toes): None, normal, Trunk  Movements Neck, shoulders, hips: None, normal, Overall Severity Severity of abnormal movements (highest score from questions above): None, normal Incapacitation due to abnormal movements: None, normal Patient's awareness of abnormal movements (rate only patient's report): No Awareness, Dental Status Current problems with teeth and/or dentures?: No Does patient usually wear dentures?: No  CIWA:    COWS:     Musculoskeletal: Strength & Muscle Tone: within normal limits Gait & Station: normal Patient leans: N/A  Psychiatric Specialty Exam: Review of Systems  Psychiatric/Behavioral: Positive for depression. The patient is nervous/anxious.   All other systems reviewed and are negative.   Blood pressure 93/58, pulse 117, temperature 98.2 F (36.8 C), temperature source Oral, resp. rate 16, height 5\' 9"  (1.753 m), weight 60.328 kg (133 lb).Body mass index is 19.63 kg/(m^2).  General Appearance: Fairly Groomed  Patent attorneyye Contact::  Fair  Speech:  Clear and Coherent  Volume:  Normal  Mood:  Anxious  Affect:  Flat  Thought Process:  Irrelevant improving  Orientation:  Full (Time, Place, and Person)  Thought Content:  Delusions, Paranoid Ideation and Rumination improving  Suicidal Thoughts:  No  Homicidal Thoughts:  No  Memory:  Immediate;   Fair Recent;   Fair Remote;   Fair  Judgement:  Impaired  Insight:  Shallow  Psychomotor Activity:  Normal  Concentration:  Poor  Recall:  FiservFair  Fund of Knowledge:Fair  Language: Fair  Akathisia:  No  Handed:  Right  AIMS (if indicated):     Assets:  Desire for Improvement  ADL's:  Intact  Cognition: WNL  Sleep:  Number of Hours: 6.5   Treatment Plan Summary:Patient presented with depression and psychosis. Pt today appears withdrawn, isolative . Will continue treatment.  Daily contact with patient to assess and evaluate symptoms and progress in treatment and Medication management Will increase Celexa to 15 mg po daily for affective sx. Will  continue  Haldol to 10  mg po qhs for psychosis. Will continue Cogentin 0.5 mg po qhs for EPS. Will continue Trazodone 50 mg po qhs for sleep. Will continue PRN medications as per agitation protocol. Will continue to monitor vitals ,medication compliance and treatment side effects while patient is here.  Will monitor for medical issues as well as call consult as needed.  Reviewed labs TSH - wnl, Lipid panel - abnormal - consulted dietician.PL - elevated - could monitor on an out pt basis. CSW will start working on disposition.  Patient to participate in therapeutic milieu .   Reiko Vinje md 07/16/2015, 9:43 AM

## 2015-07-16 NOTE — [facility] Group Notes (Signed)
[facility] Group Notes: (Clinical Social Work)   07/16/2015      Type of Therapy:  Group Therapy   Participation Level:  Did Not Attend despite MHT prompting   Ambrose MantleMareida Grossman-Orr, LCSW 07/16/2015, 12:57 PM

## 2015-07-17 NOTE — [facility] Group Notes (Signed)
[facility] LCSW Group Therapy  07/17/2015 , 4:04 PM   Type of Therapy:  Group Therapy  Participation Level:  Active  Participation Quality:  Attentive  Affect:  Appropriate  Cognitive:  Alert  Insight:  Improving  Engagement in Therapy:  Engaged  Modes of Intervention:  Discussion, Exploration and Socialization  Summary of Progress/Problems: Today's group focused on the term Diagnosis.  Participants were asked to define the term, and then pronounce whether it is a negative, positive or neutral term. Stayed the entire time.  Attentive throughout.  Minimal participation, but when questioned directly, was willing to participate.  Talked about the importance of his church to him, both re: his faith, but also as a diverse community.  He specifically sought out one that was compromised of diffent nationalities and races, not just [place].  Feels that his confusion and inability to concentrate have been eliminated.  Hoping to leave soon.  Daryel Geraldorth, Leahmarie Gasiorowski B 07/17/2015 , 4:04 PM

## 2015-07-17 NOTE — Progress Notes (Signed)
[facility]  07/17/2015  Peter Golden  MRN:  [id number] Subjective: Patient states: "I'm feeling much better. I would like to go home."   Objective:Peter Golden is a 35 y.o. AA male, single, student at [employer], denies hx of past mental illness or medical problems, [facility] under the request of his friend, Lily PeerShantal [phone]). Pt was thereafter [facility] for further management.Pt on admission was delusional, paranoid, depressed and had sleep issues.  Pt seen and chart reviewed. Pt is alert/oriented x4, calm, cooperative, and appropriate to situation. Pt denies suicidal/homicidal ideation and psychosis and does not appear to be responding to internal stimuli. However, pt is still mildly tangential. He appears to be improving.   Interpreter was utilized, pt did understand some [place].   Principal Problem: MDD (major depressive disorder), single episode, severe with psychosis (HCC)     Diagnosis:   Patient Active Problem List   Diagnosis Date Noted  . 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 Hyperlipidemia [E78.5] 07/13/2015   Total Time spent with patient: 15 minutes  Past Psychiatric History: Pt denies past hx of mental illness or suicide attempts  Past Medical History: see H&p Family History: please see H&P. Family History  Problem Relation Age of Onset  . Mental illness Neg Hx    Family Psychiatric  History: Pt denies hx of mental illness, drug abuse or suicide in family. Social History: Pt is single , [place] , student at [employer] , living in [place] since the past 3 years . Pt denies any legal issues. Pt is religious   History  Alcohol Use No     History  Drug Use No    Social History   Social History  . Marital Status: Single    Spouse Name: N/A  . Number of Children: N/A   . Years of Education: N/A   Social History Main Topics  . Smoking status: Never Smoker   . Smokeless tobacco: Never Used  . Alcohol Use: No  . Drug Use: No  . Sexual Activity: Not Asked   Other Topics Concern  . None   Social History Narrative   Additional Social History:                         Sleep: Fair  Appetite:  Fair  Current Medications: Current Facility-Administered Medications  Medication Dose Route Frequency Provider Last Rate Last Dose  . acetaminophen (TYLENOL) tablet 650 mg  650 mg Oral Q6H PRN Kerry HoughSpencer E Simon, PA-C      . alum & mag hydroxide-simeth (MAALOX/MYLANTA) 200-200-20 MG/5ML suspension 30 mL  30 mL Oral Q4H PRN Kerry HoughSpencer E Simon, PA-C      . benztropine (COGENTIN) tablet 0.5 mg  0.5 mg Oral QHS Jomarie LongsSaramma Eappen, MD   0.5 mg at 07/16/15 2058  . citalopram (CELEXA) tablet 15 mg  15 mg Oral Daily Saramma Eappen, MD   15 mg at 07/17/15 0829  . haloperidol (HALDOL) tablet 10 mg  10 mg Oral QHS Jomarie LongsSaramma Eappen, MD   10 mg at 07/16/15 2059  . hydrOXYzine (ATARAX/VISTARIL) tablet 25 mg  25 mg Oral Q6H PRN Kerry HoughSpencer E Simon, PA-C      . risperiDONE (RISPERDAL M-TABS) disintegrating tablet 2 mg  2 mg Oral Q8H PRN Kerry HoughSpencer E Simon, PA-C  And  . LORazepam (ATIVAN) tablet 1 mg  1 mg Oral PRN Kerry Hough, PA-C       And  . ziprasidone (GEODON) injection 20 mg  20 mg Intramuscular PRN Kerry Hough, PA-C      . magnesium hydroxide (MILK OF MAGNESIA) suspension 30 mL  30 mL Oral Daily PRN Kerry Hough, PA-C      . traZODone (DESYREL) tablet 50 mg  50 mg Oral QHS,MR X 1 Spencer E Simon, PA-C   50 mg at 07/16/15 2059    Lab Results:  No results found for this or any previous visit (from the past 48 hour(s)).  Physical Findings: AIMS: Facial and Oral Movements Muscles of Facial Expression: None, normal Lips and Perioral Area: None, normal Jaw: None, normal Tongue: None, normal,Extremity Movements Upper (arms, wrists, hands, fingers): None,  normal Lower (legs, knees, ankles, toes): None, normal, Trunk Movements Neck, shoulders, hips: None, normal, Overall Severity Severity of abnormal movements (highest score from questions above): None, normal Incapacitation due to abnormal movements: None, normal Patient's awareness of abnormal movements (rate only patient's report): No Awareness, Dental Status Current problems with teeth and/or dentures?: No Does patient usually wear dentures?: No  CIWA:    COWS:     Musculoskeletal: Strength & Muscle Tone: within normal limits Gait & Station: normal Patient leans: N/A  Psychiatric Specialty Exam: Review of Systems  Psychiatric/Behavioral: Positive for depression. The patient is nervous/anxious.   All other systems reviewed and are negative.   Blood pressure 119/75, pulse 93, temperature 98.7 F (37.1 C), temperature source Oral, resp. rate 18, height 5\' 9"  (1.753 m), weight 60.328 kg (133 lb).Body mass index is 19.63 kg/(m^2).  General Appearance: Fairly Groomed  Patent attorney::  Fair  Speech:  Clear and Coherent  Volume:  Normal  Mood:  Anxious  Affect:  Flat  Thought Process:  Irrelevant improving  Orientation:  Full (Time, Place, and Person)  Thought Content:  Delusions, Paranoid Ideation and Rumination improving greatly  Suicidal Thoughts:  No  Homicidal Thoughts:  No  Memory:  Immediate;   Fair Recent;   Fair Remote;   Fair  Judgement:  Impaired  Insight:  Shallow  Psychomotor Activity:  Normal  Concentration:  Poor  Recall:  [employer] of Knowledge:Fair  Language: Fair  Akathisia:  No  Handed:  Right  AIMS (if indicated):     Assets:  Desire for Improvement  ADL's:  Intact  Cognition: WNL  Sleep:  Number of Hours: 6.5   Treatment Plan Summary:Patient presented with depression and psychosis. Pt is improving today on 07/17/2015 and is more lucid. Assessment done with interpreter although pt does seem to understand some English. Will continue plan as below as pt  is improving.    Daily contact with patient to assess and evaluate symptoms and progress in treatment and Medication management Will Continue Celexa to 15 mg po daily for affective sx. Will continue  Haldol to 10  mg po qhs for psychosis. Will continue Cogentin 0.5 mg po qhs for EPS. Will continue Trazodone 50 mg po qhs for sleep. Will continue PRN medications as per agitation protocol. Will continue to monitor vitals ,medication compliance and treatment side effects while patient is here.  Will monitor for medical issues as well as call consult as needed.  Reviewed labs TSH - wnl, Lipid panel - abnormal - consulted dietician.PL - elevated - could monitor on an out pt basis. CSW will start working on disposition.  Patient to participate in therapeutic milieu .   Beau Fanny, FNP-BC 07/17/2015, 11:35 AM I agree with assessment and plan Madie Reno A. Dub Mikes, M.D.

## 2015-07-17 NOTE — Progress Notes (Signed)
Scherrie MerrittsGbedey has been in his room most of the day.  He did get up to take AM medications.  He reported that he denies SI/HI or A/V hallucinations.  He completed his self inventory and reports that his depression is 7/10, hopelessness 8/10, and anxiety 3/10.  He states that his goal for today is "get well and go start my activities" and he will accomplish this goal by "getting medicine."  He denies any pain or discomfort and appears to be in no physical distress.  Encouraged participation in group and unit activities.  Q 15 minute checks maintained for safety.  We will continue to monitor the progress towards his goals.

## 2015-07-17 NOTE — Progress Notes (Signed)
D. Pt had been up and visible in milieu, however has spent much of the evening in his room and in bed. Pt spoke about how he is doing ok but does appear depressed and withdrawn in the milieu. Pt spoke briefly about how he hopes that he can be discharged soon so he can get back to school. Pt did receive all bedtime medications without incident and did speak about how he is getting adequate sleep. A. Support and encouragement provided. R. Safety maintained, will continue to monitor.

## 2015-07-17 NOTE — Progress Notes (Signed)
Peter Golden was out of his room more this afternoon.  He has been attending group, with interpreter, and interacting with a male peer that is able to communicate with he.  He denies any SI/HI or A/V hallucinations at this time.  Encouraged him to continue to participate in group and unit activities.

## 2015-07-18 DIAGNOSIS — E785 Hyperlipidemia, unspecified: Secondary | ICD-10-CM

## 2015-07-18 DIAGNOSIS — E221 Hyperprolactinemia: Secondary | ICD-10-CM

## 2015-07-18 LAB — MALARIA SMEAR
MALARIA PREP: NEGATIVE
Special Requests: NORMAL

## 2015-07-18 MED ORDER — TRAZODONE HCL 50 MG PO TABS
50.0000 mg | ORAL_TABLET | Freq: Every evening | ORAL | Status: DC | PRN
  Filled 2015-07-18: qty 7

## 2015-07-18 MED ORDER — CITALOPRAM HYDROBROMIDE 10 MG PO TABS: 15.0000 mg | ORAL_TABLET | Freq: Every day | ORAL | Status: DC

## 2015-07-18 MED ORDER — HYDROXYZINE HCL 25 MG PO TABS: 25.0000 mg | ORAL_TABLET | Freq: Four times a day (QID) | ORAL | Status: DC | PRN

## 2015-07-18 MED ORDER — HALOPERIDOL 10 MG PO TABS: 10.0000 mg | ORAL_TABLET | Freq: Every day | ORAL | Status: DC

## 2015-07-18 MED ORDER — BENZTROPINE MESYLATE 0.5 MG PO TABS: 0.5000 mg | ORAL_TABLET | Freq: Every day | ORAL | Status: DC

## 2015-07-18 MED ORDER — TRAZODONE HCL 50 MG PO TABS: 50.0000 mg | ORAL_TABLET | Freq: Every evening | ORAL | Status: DC | PRN

## 2015-07-18 NOTE — Progress Notes (Signed)
D. Pt had been up and visible in milieu, attended and participated in milieu activity. Pt spoke about how he is feeling better but has appeared flat and withdrawn on the unit. Pt, immediately went to bed after evening activity and took bedtime medications without incident and spoke about how he wanted to get some sleep.  A. Support and encouragement provided. R. Safety maintained, will continue to monitor.

## 2015-07-18 NOTE — Tx Team (Signed)
Interdisciplinary Treatment Plan Update (Adult)  Date:  07/18/2015   Time Reviewed:  8:14 AM   Progress in Treatment: Attending groups: Yes. Participating in groups:  Yes. Taking medication as prescribed:  Yes. Tolerating medication:  Yes. Family/Significant other contact made:  No Patient understands diagnosis:  Yes  As evidenced by seeking help with "hearing voices and confusion" Discussing patient identified problems/goals with staff:  Yes, see initial care plan. Medical problems stabilized or resolved:  Yes. Denies suicidal/homicidal ideation: Yes. Issues/concerns per patient self-inventory:  No. Other:  New problem(s) identified:  Discharge Plan or Barriers:  Reason for Continuation of Hospitalization:   Comments:  Peter Golden is an 35 y.o. male who present voluntarily to [facility] under the request of his friend, Daphane Shepherd [phone]). Pt believes that he is in the ED to receive vaccinations to go back to his [place]. Counselor spoke to [place] before assessment as a collateral contact, given the circumstances. Shantal shared that she, as well as pt's roommate, have noticed some behavioral changes in the pt in the past few weeks or so, but more specifically since this past Friday. Shantal reported that pt has become very isolative, has not been doing well in school (which is very unlike him), not eating or sleeping well, and talking to himself. Shantal added that she asked pt to spend the weekend with her and her family, due to his bizarre behaviors. She stated that, while there, he was isolative and stayed awake the entire night, taking to himself. Shantal also shared that, prior to this, pt called her and informed her that he was going to die and asked her to pray for him. Shantal additionally reported that, per his roommate, pt has not been sleeping at all, when at home.  :Patient is a 64 y old AAM , with no past psychiatric hx, presented after having  delusional thoughts as well as paranoia and depressed mood.  Will start a trial of Celexa 10 mg po daily for affective sx. Will add Haldol 5 mg po qhs for psychosis. Will add Cogentin 0.5 mg po qhs for EPS. Will continue Trazodone 50 mg po qhs for sleep.  Estimated length of stay: D/C today  New goal(s):  Review of initial/current patient goals per problem list:   Review of initial/current patient goals per problem list:  1. Goal(s): Patient will participate in aftercare plan   Met: Yes   Target date: 3-5 days post admission date   As evidenced by: Patient will participate within aftercare plan AEB aftercare provider and housing plan at discharge being identified. 07/12/15:  Plans to return home, follow up outpt   2. Goal (s): Patient will exhibit decreased depressive symptoms and suicidal ideations.   Met: Yes   Target date: 3-5 days post admission date   As evidenced by: Patient will utilize self rating of depression at 3 or below and demonstrate decreased signs of depression or be deemed stable for discharge by MD. 07/12/15:  Rates his depression a 6 today 07/18/15:  Denies depression today    3. Goal(s): Patient will demonstrate decreased signs and symptoms of anxiety.   Met: Yes   Target date: 3-5 days post admission date   As evidenced by: Patient will utilize self rating of anxiety at 3 or below and demonstrated decreased signs of anxiety, or be deemed stable for discharge by MD 07/12/15:  Rates his anxiety a 6 today 07/18/15:  Denies anxiety today    . Goal(s): Patient will  demonstrate decreased signs of psychosis  * Met: Yes  * Target date: 3-5 days post admission date  * As evidenced by: Patient will demonstrate decreased frequency of AVH or return to baseline function 07/12/15:  C/O hearing voices, confusion, and demonstrating paranoia 07/18/15:  No sign nor symptoms of psychosis today         Attendees: Patient:  07/18/2015 8:14 AM    Family:   07/18/2015 8:14 AM   Physician:  Ursula Alert, MD 07/18/2015 8:14 AM   Nursing:   Manuella Ghazi,  RN 07/18/2015 8:14 AM   CSW:    Roque Lias, LCSW   07/18/2015 8:14 AM   Other:  07/18/2015 8:14 AM   Other:   07/18/2015 8:14 AM   Other:  Lars Pinks, Nurse CM 07/18/2015 8:14 AM   Other:   07/18/2015 8:14 AM   Other:  Norberto Sorenson, P4CC  07/18/2015 8:14 AM   Other:  07/18/2015 8:14 AM   Other:  07/18/2015 8:14 AM   Other:  07/18/2015 8:14 AM   Other:  07/18/2015 8:14 AM   Other:  07/18/2015 8:14 AM   Other:   07/18/2015 8:14 AM    Scribe for Treatment Team:   Trish Mage, 07/18/2015 8:14 AM

## 2015-07-18 NOTE — Progress Notes (Signed)
  [facility] Adult Case Management Discharge Plan :  Will you be returning to the same living situation after discharge:  Yes,  home At discharge, do you have transportation home?: Yes,  friend Do you have the ability to pay for your medications: Yes,  mental health  Release of information consent forms completed and in the chart;  Patient's signature needed at discharge.  Patient to Follow up at: Follow-up Information    Follow up with [facility].   Specialty:  Behavioral Health   Why:  Go to the walk in clinic this week between 8 and 11AM for your hospital follow up appointment   Contact information:   [address] [place] [address] [phone]       Next level of care provider has access to [place] [facility]:  No  Safety Planning and Suicide Prevention discussed: Yes,  yes  Have you used any form of tobacco in the last 30 days? (Cigarettes, Smokeless Tobacco, Cigars, and/or Pipes): No  Has patient been referred to the Quitline?: N/A patient is not a smoker  Patient has been referred for addiction treatment: N/A  Ida Rogue 07/18/2015, 10:25 AM

## 2015-07-18 NOTE — BHH Suicide Risk Assessment (Signed)
[facility] INPATIENT:  Family/Significant Other Suicide Prevention Education  Suicide Prevention Education:  Education Completed; No one has been identified by the patient as the family member/significant other with whom the patient will be residing, and identified as the person(s) who will aid the patient in the event of a mental health crisis (suicidal ideations/suicide attempt).  With written consent from the patient, the family member/significant other has been provided the following suicide prevention education, prior to the and/or following the discharge of the patient.  The suicide prevention education provided includes the following:  Suicide risk factors  Suicide prevention and interventions  National Suicide Hotline telephone number  [facility] assessment telephone number  [facility] Emergency Assistance 911  [facility] and/or Residential Mobile Crisis Unit telephone number  Request made of family/significant other to:  Remove weapons (e.g., guns, rifles, knives), all items previously/currently identified as safety concern.    Remove drugs/medications (over-the-counter, prescriptions, illicit drugs), all items previously/currently identified as a safety concern.  The family member/significant other verbalizes understanding of the suicide prevention education information provided.  The family member/significant other agrees to remove the items of safety concern listed above. The patient did not endorse SI at the time of admission, nor did the patient c/o SI during the stay here.  SPE not required. However, I did talk to friend Loletha Carrow, [phone], about a crises plan.  Daryel Gerald B 07/18/2015, 10:19 AM

## 2015-07-18 NOTE — Progress Notes (Signed)
Pt discharged home with family member. Pt was ambulatory, stable and appreciative at the time.  All papers and prescriptions were given and valuables returned. [place]  understanding expressed. Denies SI/HI and A/VH. Pt given opportunity to express concerns and asked questions.

## 2015-07-18 NOTE — [facility] Suicide Risk Assessment (Signed)
[facility] Discharge Suicide Risk Assessment   Demographic Factors:  Male  Total Time spent with patient: 30 minutes  Musculoskeletal: Strength & Muscle Tone: within normal limits Gait & Station: normal Patient leans: N/A  Psychiatric Specialty Exam: Physical Exam  Review of Systems  Psychiatric/Behavioral: Negative for depression, suicidal ideas and hallucinations. The patient is not nervous/anxious.   All other systems reviewed and are negative.   Blood pressure 104/79, pulse 105, temperature 98.3 F (36.8 C), temperature source Oral, resp. rate 18, height  (1.753 m), weight 60.328 kg (133 lb).Body mass index is 19.63 kg/(m^2).  General Appearance: Casual  Eye Contact::  Fair  Speech:  Clear and Coherent409  Volume:  Normal  Mood:  Euthymic  Affect:  Appropriate  Thought Process:  Coherent  Orientation:  Full (Time, Place, and Person)  Thought Content:  WDL  Suicidal Thoughts:  No  Homicidal Thoughts:  No  Memory:  Immediate;   Fair Recent;   Fair Remote;   Fair  Judgement:  Fair  Insight:  Fair  Psychomotor Activity:  Normal  Concentration:  Fair  Recall:  [employer] of Knowledge:Fair  Language: Fair  Akathisia:  No  Handed:  Right  AIMS (if indicated):     Assets:  Desire for Improvement  Sleep:  Number of Hours: 6.75  Cognition: WNL  ADL's:  Intact   Have you used any form of tobacco in the last 30 days? (Cigarettes, Smokeless Tobacco, Cigars, and/or Pipes): No  Has this patient used any form of tobacco in the last 30 days? (Cigarettes, Smokeless Tobacco, Cigars, and/or Pipes) No  Mental Status Per Nursing Assessment::   On Admission:     Current Mental Status by Physician: Pt denies SI/HI/AH/VH  Loss Factors: Being away from family , stressors of work and school  Historical Factors: Impulsivity  Risk Reduction Factors:   Positive social support  Continued Clinical Symptoms:  Depression:   Impulsivity  Cognitive Features That Contribute To  Risk:  None    Suicide Risk:  Minimal: No identifiable suicidal ideation.  Patients presenting with no risk factors but with morbid ruminations; may be classified as minimal risk based on the severity of the depressive symptoms  Principal Problem: MDD (major depressive disorder), single episode, severe with psychosis (HCC)- IMPROVED Discharge Diagnoses:  Patient Active Problem List   Diagnosis Date Noted  . 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 Hyperlipidemia [E78.5] 07/13/2015      Plan Of Care/Follow-up recommendations:  Activity:  No restrictions Diet:  regular Tests:  Prolactin level as per out patient recommendations Other:  none  Is patient on multiple antipsychotic therapies at discharge:  No   Has Patient had three or more failed trials of antipsychotic monotherapy by history:  No  Recommended Plan for Multiple Antipsychotic Therapies: NA    Lige Lakeman MD 07/18/2015, 9:35 AM

## 2015-07-18 NOTE — Discharge Summary (Signed)
Physician Discharge Summary Note  Patient:  Peter Golden is an 35 y.o., male MRN:  [id number] DOB:  12-30-1980 Patient phone:  [phone] (home)  Patient address:   [address] [place] [address],  Total Time spent with patient: 45 minutes  Date of Admission:  07/11/2015 Date of Discharge: 07/20/2015  Reason for Admission:   Peter Golden is a 35 y.o. AA male, single, student at [employer], denies hx of past mental illness or medical problems, presented to [facility] voluntarily under the request of his friend, [name] [phone]). Pt was thereafter admitted to [facility] for further management.   Per initial notes in EHR: "Pt believes that he is in the ED to receive vaccinations to go back to his hometown of [place], [place]. Counselor spoke to Peter Golden before assessment as a collateral contact, given the circumstances. Peter Golden shared that she, as well as pt's roommate, have noticed some behavioral changes in the pt in the past few weeks or so, but more specifically since this past Friday. Peter Golden reported that pt has become very isolative, has not been doing well in school (which is very unlike him), not eating or sleeping well, and talking to himself. Peter Golden added that she asked pt to spend the weekend with her and her family, due to his bizarre behaviors. She stated that, while there, he was isolative and stayed awake the entire night, taking to himself. Peter Golden also shared that, prior to this, pt called her and informed her that he was going to die and asked her to pray for him. Peter Golden additionally reported that, per his roommate, pt has not been sleeping at all, when at home. Peter Golden indicated that, over the last few days, pt has been pre-occupied with going back to his hometown in [place], [place] and has been continually talking about it. She shared that they have tried to redirect pt and he will refer right back to going to [place]. Peter [place] reports that pt does not use drugs or alcohol  and has no psychiatric hx, that she is aware of. Counselor assessed pt by himself. Pt was a poor historian and had confused speech. He did not appear to be responding to internal stimuli, but was clearly preoccupied with going back to his hometown to "resolve an issue" and that he "will come back in 3 days". Counselor attempted to ask pt questions about AVH and if he had taken or been given any strange drinks or pills. Pt denied both and referred back to having to get to his hometown to resolve an issue and returning in 3 days. Counselor asked pt about not sleeping and talking to himself all night. Pt stated that he sometimes talks to himself, but would stop once he got to [place] and resolved the issue and returned in 3 days."  Patient seen and chart reviewed .Discussed patient with treatment team.  Patient today seen as calm , cooperative , pleasant. Pt speaks english , but sometimes questions needs to be broken down in to simpler sentences. Pt reports that he is originally from [place], [place]. Pt came to [place] , 3 years ago for his education. Pt reports that he currently goes to [employer] and is hoping to get his Bachelors soon. Pt wants to get a job and also bring his family here. Pt reports that all his family is in [place]. He has not seen them in three years. Pt reports that he has an Uncle here - who is not really an uncle , but  a support system , who helped him when he originally came to [place]. Pt reports that he moved out from his Uncles place and currently lives in an apartment. However , he feels that his uncle is still trying to control him, not really giving him his freedom. Pt states that [place]free [place]" , but his uncle still wants to control him. Pt reports that after his uncle or his wife calls him on the phone , he always gets upset and has racing thoughts. Pt reports that his frustration has reached a point where he feels that his uncle can actually hurt him. Pt reports that his uncle is a  spiritual person and he feels that his uncle can actually do "Vodoo" against him and may even kill him.   Pt also reports anxiety sx , constantly worries about his uncle . Pt reports sadness about being away from family. Pt reports decreased sleep, appetite and loss of concentration since the past >2 weeks. Pt reports that sometimes when he goes to bed , he is afraid that he will die and will not wake up. However , when asked about SI , he reports he does not want to do anything to hurt self. Pt does report that he does have thoughts about hurting this uncle , may be do "vodoo" against them.  Pt denies past hx of mental illness or suicide attempts.  Pt denies hx of abusing any substances and his UDS/BAL is negative.  Principal Problem: MDD (major depressive disorder), single episode, severe with psychosis [facility]) Discharge Diagnoses: Patient Active Problem List   Diagnosis Date Noted  . 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 Hyperlipidemia [E78.5] 07/13/2015    Past Psychiatric History: See H&P  Past Medical History: History reviewed. No pertinent past medical history. History reviewed. No pertinent past surgical history. Family History:  Family History  Problem Relation Age of Onset  . Mental illness Neg Hx    Family Psychiatric  History: See H&P Social History:  History  Alcohol Use No     History  Drug Use No    Social History   Social History  . Marital Status: Single    Spouse Name: N/A  . Number of Children: N/A  . Years of Education: N/A   Social History Main Topics  . Smoking status: Never Smoker   . Smokeless tobacco: Never Used  . Alcohol Use: No  . Drug Use: No  . Sexual Activity: Not Asked   Other Topics Concern  . None   Social History Narrative    Hospital Course:   Sebastin Perlmutter was admitted for MDD (major depressive disorder), single  episode, severe with psychosis (HCC), and crisis management.  Pt was treated discharged with the medications listed below under Medication List.  Medical problems were identified and treated as needed.  Home medications were restarted as appropriate.  Improvement was monitored by observation and [name] 's daily report of symptom reduction.  Emotional and mental status was monitored by daily self-inventory reports completed by [name] and clinical staff.         Selah Kokou Cecilio was evaluated by the treatment team for stability and plans for continued recovery upon discharge. Bensyn Kokou Dunn 's motivation was an integral factor for scheduling further treatment. Employment, transportation, bed availability, health status, family support, and any pending legal issues were also considered during hospital  stay. Pt was offered further treatment options upon discharge including but not limited to Residential, Intensive Outpatient, and Outpatient treatment.  Chrsitopher Kokou Hollett will follow up with the services as listed below under Follow Up Information.     Upon completion of this admission the patient was both mentally and medically stable for discharge denying suicidal/homicidal ideation, auditory/visual/tactile hallucinations, delusional thoughts and paranoia.    Makenzie Kokou Haye responded well to treatment with cogentin, celexa, haldol, vistaril, and trazodone without adverse effects. Pt demonstrated improvement without reported or observed adverse effects to the point of stability appropriate for outpatient management. Pertinent labs include: Cholesterol 252, LDL 181, Prolactin 47.9 for which outpatient follow-up is necessary for lab recheck as mentioned below. Reviewed CBC, CMP, BAL, and UDS; all unremarkable aside from noted exceptions.   Physical Findings: AIMS: Facial and Oral Movements Muscles of Facial Expression: None, normal Lips and Perioral Area: None,  normal Jaw: None, normal Tongue: None, normal,Extremity Movements Upper (arms, wrists, hands, fingers): None, normal Lower (legs, knees, ankles, toes): None, normal, Trunk Movements Neck, shoulders, hips: None, normal, Overall Severity Severity of abnormal movements (highest score from questions above): None, normal Incapacitation due to abnormal movements: None, normal Patient's awareness of abnormal movements (rate only patient's report): No Awareness, Dental Status Current problems with teeth and/or dentures?: No Does patient usually wear dentures?: No  CIWA:    COWS:     Musculoskeletal: Strength & Muscle Tone: within normal limits Gait & Station: normal Patient leans: N/A  Psychiatric Specialty Exam: Review of Systems  Psychiatric/Behavioral: Positive for depression. Negative for suicidal ideas and hallucinations. The patient is nervous/anxious and has insomnia.   All other systems reviewed and are negative.   Blood pressure 104/79, pulse 105, temperature 98.3 F (36.8 C), temperature source Oral, resp. rate 18, height  (1.753 m), weight 60.328 kg (133 lb).Body mass index is 19.63 kg/(m^2).  SEE MD PSE within the [facility]   Have you used any form of tobacco in the last 30 days? (Cigarettes, Smokeless Tobacco, Cigars, and/or Pipes): No  Has this patient used any form of tobacco in the last 30 days? (Cigarettes, Smokeless Tobacco, Cigars, and/or Pipes) Yes, No  Metabolic Disorder Labs:  Lab Results  Component Value Date   HGBA1C 5.5 07/13/2015   MPG 111 07/13/2015   Lab Results  Component Value Date   PROLACTIN 47.9* 07/13/2015   Lab Results  Component Value Date   CHOL 252* 07/13/2015   TRIG 79 07/13/2015   HDL 55 07/13/2015   CHOLHDL 4.6 07/13/2015   VLDL 16 07/13/2015   LDLCALC 181* 07/13/2015    See Psychiatric Specialty Exam and Suicide Risk Assessment completed by Attending Physician prior to discharge.  Discharge destination:  Home  Is patient on  multiple antipsychotic therapies at discharge:  No   Has Patient had three or more failed trials of antipsychotic monotherapy by history:  No  Recommended Plan for Multiple Antipsychotic Therapies: NA     Medication List    STOP taking these medications        ibuprofen 200 MG tablet  Commonly known as:  ADVIL,MOTRIN      TAKE these medications      Indication   benztropine 0.5 MG tablet  Commonly known as:  COGENTIN  Take 1 tablet (0.5 mg total) by mouth at bedtime.   Indication:  Extrapyramidal Reaction caused by Medications     citalopram 10 MG tablet  Commonly known as:  CELEXA  Take 1.5 tablets (  15 mg total) by mouth daily.   Indication:  Depression     haloperidol 10 MG tablet  Commonly known as:  HALDOL  Take 1 tablet (10 mg total) by mouth at bedtime.   Indication:  Psychosis     hydrOXYzine 25 MG tablet  Commonly known as:  ATARAX/VISTARIL  Take 1 tablet (25 mg total) by mouth every 6 (six) hours as needed for anxiety.   Indication:  Anxiety Neurosis     traZODone 50 MG tablet  Commonly known as:  DESYREL  Take 1 tablet (50 mg total) by mouth at bedtime as needed for sleep.   Indication:  Trouble Sleeping        Follow-up recommendations:  Activity:  As tolerated Diet:  Heart healthy with low sodium.  Comments:   Take all medications as prescribed. Keep all follow-up appointments as scheduled.  Do not consume alcohol or use illegal drugs while on prescription medications. Report any adverse effects from your medications to your primary care provider promptly.  In the event of recurrent symptoms or worsening symptoms, call 911, a crisis hotline, or go to the nearest emergency department for evaluation.   Signed: Beau Fanny, FNP-BC  07/18/2015, 10:24 AM

## 2015-11-23 ENCOUNTER — Ambulatory Visit (HOSPITAL_COMMUNITY): Admission: RE | Admit: 2015-11-23 | Payer: Self-pay | Source: Home / Self Care | Admitting: Psychiatry

## 2015-12-14 ENCOUNTER — Inpatient Hospital Stay (HOSPITAL_COMMUNITY)
Admission: AD | Admit: 2015-12-14 | Discharge: 2015-12-18 | DRG: 885 | Disposition: A | Discharge status: Home / Self Care | Payer: No Typology Code available for payment source | Attending: Psychiatry | Admitting: Psychiatry

## 2015-12-14 ENCOUNTER — Encounter (HOSPITAL_COMMUNITY): Payer: Self-pay

## 2015-12-14 DIAGNOSIS — F411 Generalized anxiety disorder: Secondary | ICD-10-CM | POA: Diagnosis present

## 2015-12-14 DIAGNOSIS — F259 Schizoaffective disorder, unspecified: Secondary | ICD-10-CM | POA: Diagnosis present

## 2015-12-14 DIAGNOSIS — G4700 Insomnia, unspecified: Secondary | ICD-10-CM | POA: Diagnosis present

## 2015-12-14 DIAGNOSIS — E785 Hyperlipidemia, unspecified: Secondary | ICD-10-CM | POA: Diagnosis present

## 2015-12-14 DIAGNOSIS — F332 Major depressive disorder, recurrent severe without psychotic features: Secondary | ICD-10-CM | POA: Diagnosis present

## 2015-12-14 DIAGNOSIS — F333 Major depressive disorder, recurrent, severe with psychotic symptoms: Secondary | ICD-10-CM

## 2015-12-14 LAB — URINALYSIS, ROUTINE W REFLEX MICROSCOPIC
Bilirubin Urine: NEGATIVE
Glucose, UA: NEGATIVE mg/dL
Ketones, ur: NEGATIVE mg/dL
Leukocytes, UA: NEGATIVE
Nitrite: NEGATIVE
Protein, ur: NEGATIVE mg/dL
SPECIFIC GRAVITY, URINE: 1.012 (ref 1.005–1.030)
pH: 5.5 (ref 5.0–8.0)

## 2015-12-14 LAB — RAPID URINE DRUG SCREEN, HOSP PERFORMED
AMPHETAMINES: NOT DETECTED
Barbiturates: NOT DETECTED
Benzodiazepines: NOT DETECTED
COCAINE: NOT DETECTED
OPIATES: NOT DETECTED
TETRAHYDROCANNABINOL: NOT DETECTED

## 2015-12-14 LAB — URINE MICROSCOPIC-ADD ON

## 2015-12-14 MED ORDER — ACETAMINOPHEN 325 MG PO TABS: 650.0000 mg | ORAL_TABLET | Freq: Four times a day (QID) | ORAL | Status: DC | PRN

## 2015-12-14 MED ORDER — HYDROXYZINE HCL 50 MG PO TABS
50.0000 mg | ORAL_TABLET | Freq: Every evening | ORAL | Status: DC | PRN
  Administered 2015-12-14: 50 mg via ORAL
  Filled 2015-12-14: qty 7
  Filled 2015-12-14: qty 1

## 2015-12-14 MED ORDER — ALUM & MAG HYDROXIDE-SIMETH 200-200-20 MG/5ML PO SUSP: 30.0000 mL | ORAL | Status: DC | PRN

## 2015-12-14 MED ORDER — ENSURE ENLIVE PO LIQD
237.0000 mL | Freq: Two times a day (BID) | ORAL | Status: DC
  Administered 2015-12-14 – 2015-12-18 (×8): 237 mL via ORAL

## 2015-12-14 MED ORDER — MAGNESIUM HYDROXIDE 400 MG/5ML PO SUSP: 30.0000 mL | Freq: Every day | ORAL | Status: DC | PRN

## 2015-12-14 NOTE — Tx Team (Signed)
Initial Interdisciplinary Treatment Plan   PATIENT STRESSORS: Educational concerns Medication change or noncompliance   PATIENT STRENGTHS: Ability for insight Average or above average intelligence Communication skills Religious Affiliation   PROBLEM LIST: Problem List/Patient Goals Date to be addressed Date deferred Reason deferred Estimated date of resolution  "To resolve my problem" 12/14/15     "School work" 12/14/15     Depression 12/14/15     Medication Noncompliance 12/14/15                                    DISCHARGE CRITERIA:  Ability to meet basic life and health needs Adequate post-discharge living arrangements Verbal commitment to aftercare and medication compliance  PRELIMINARY DISCHARGE PLAN: Attend aftercare/continuing care group Outpatient therapy Return to previous living arrangement Return to previous work or school arrangements  PATIENT/FAMIILY INVOLVEMENT: This treatment plan has been presented to and reviewed with the patient, [place].  The patient and family have been given the opportunity to ask questions and make suggestions.  [place] 12/14/2015, 4:23 PM

## 2015-12-14 NOTE — Progress Notes (Signed)
Admission Note:  Patient is a 35 year old male who is admitted to the unit for complain of severe depression.  Patient is alert and oriented x 4.  Patient presents with flat affect and depressed mood.  Patient states, "I am here to resolve my problem."  Patient reports not taking his medications as prescribed.  Patient reports skipping his medications.  Admission packet and plan of care reviewed and consent for treatment signed.  Skin assessment completed.  Personal belonging searched and  completed.  No contraband found.  Patient oriented to the unit, staff and room.  Safety checks initiated.  Personal belonging at bedside.

## 2015-12-14 NOTE — BHH Counselor (Signed)
Adult Comprehensive Assessment  Patient ID: [place], male DOB: [birth year], 35 y.o. 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, clinical:  Living Arrangements: Non-relatives/Friends Living conditions (as described by patient or guardian): good How long has patient lived in current situation?: 2 years  Family History:  Does patient have children?: No  Childhood History:  By whom was/is the patient raised?: Both parents Additional childhood history information: Normal childhood Description of patient's relationship with caregiver when they were a child: good Patient's description of current relationship with people who raised him/her: still good Does patient have siblings?: Yes Number of Siblings: 4 Description of patient's current relationship with siblings: All live at home in [place] Did patient suffer any verbal/emotional/physical/sexual abuse as a child?: No Did patient suffer from severe childhood neglect?: No Has patient ever been sexually abused/assaulted/raped as an adolescent or adult?: No Was the patient ever a victim of a crime or a disaster?: No Witnessed domestic violence?: No Has patient been effected by domestic violence as an adult?: No  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 [employer]    Employment/Work Situation:  Employment situation: Employed 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, art How long has patient been employed?: 2 years What is the longest time patient has a held a job?: same Where was the patient employed at that time?: same Has patient ever been in the [employer]?: No Has patient ever served in combat?: No  Financial Resources:  Financial resources: Income from  employment  Alcohol/Substance Abuse:  Alcohol/Substance Abuse Treatment Hx: Denies past history Has alcohol/substance abuse ever caused legal problems?: No  Social Support System:  Patient's Community Support System: Good Describe Community Support System: Clinical research associate, a good church friend Type of faith/religion: Darrick Meigs How does patient's faith help to cope with current illness?: Go to church and pray, sometimes by myself, sometimes with friends  Leisure/Recreation:  Leisure and Hobbies: "I love to study. I love math, accounting and finance. [place] want to own my own business [place]  Strengths/Needs:  What things does the patient do well?: academics In what areas does patient struggle / problems for patient: writing english  Discharge Plan:  Does patient have access to transportation?: Yes Will patient be returning to same living situation after discharge?: Yes Currently receiving community mental health services: No If no, would patient like referral for services when discharged?: Yes (What county[place]) Does patient have financial barriers related to discharge medications?: Yes Patient description of barriers related to discharge medications: no insurance  Summary/Recommendations:  Summary and Recommendations (to be completed by the evaluator): Peter [place] is a 35 YO [place] native who has lived in [place] for 3.5  years. He is diagnosed withMDD, recurrent, severe with psychosis.  He admits to stopping his meds some time ago "because I was feeling well,"  and also complains that when he was taking them, he felt too sedated to work.  He is currently a Ship broker at [employer], and hopes to transfer to [employer] and complete his degree in accounting there. Peter [place] is also working at [employer], and states he is looking for another job as well to supplement his income.  He came to [facility] because he has an aunt and uncle who live here, and he stayed with them for the  first  year and a half that he lived here. At his last hospitalization, he felt that his uncle meant him harm, and today he states he has had no contact with him since his last admission,  He cites his friend [place] as his main support.   He can benefit from crises stabilization, medication management, therapeutic milieu and referral for services.  Peter Golden B. 12/15/15

## 2015-12-14 NOTE — BH Assessment (Addendum)
Tele Assessment Note   Peter Golden is an 35 y.o. male  who presents accompanied by his friend/roomate Peter Golden [phone]--his phone is currently broken but says he will but another one tomorrow and gave another friends # as well--Eric [phone]) reporting symptoms of depression and thoughts that someone is messing with his mind and giving him "religious problems", and he is experiencing "mental pain". Pt has a history of depression with psychotic features, and was [facility]. Pt reports medication that he was d/c'd with makes him feel depressed when he is on it, but when he is not on it, he is confused.  He has been inconsistent with taking it, but has taken it since Tuesday.  Pt denies current suicidal ideation and denies past attempts. Pt denies HI or history of violence. Pt acknowledges symptoms including social withdrawal, loss of interest in usual pleasures, decreased concentration, fatigue, irritability, increased sleep (14 hrs), decreased appetite and feelings of hopelessness. Pt denies auditory or visual hallucinations or other psychotic symptoms, but his friend says he sees him laughing and smiling at nothing. Pt denies alcohol or substance abuse.   [place] from [place], [place]primary language [place] and came [place] the [place] on [place] in [place]. [place] for [employer]. He has a degree in accounting from [place], and is currently in school at [employer], planning to transfer to [employer] to get another accounting degree.  Pt denies current stressors. Pt lives with his friend Peter Golden, his main support . Pt denies history of abuse and trauma.   Pt has fair insight and judgement.   Pt's OP history includes [facility], but not much follow up. IP history includes [facility].   Pt is casually dressed, alert, oriented x4 with quiet speech and normal motor behavior. Eye contact is fair.  Pt's mood is depressed and affect is  depressed and blunted. Affect is congruent with mood. Thought process is coherent and relevant with possible thought blocking. There is no indication Pt is currently responding to internal stimuli or experiencing delusional thought content. Pt was cooperative throughout assessment.   Pt wants inpatient psychiatric treatment, and Malachy Chamberakia Starkes, NP recommends IP treatement.  Mardella [facility]manLindsey, [facility], bed assignment pending.    Diagnosis: MDD, recurrent, severe with psychotic features  Past Medical History: No past medical history on file.  No past surgical history on file.  Family History:  Family History  Problem Relation Age of Onset  . Mental illness Neg Hx     Social History:  reports that he has never smoked. He has never used smokeless tobacco. He reports that he does not drink alcohol or use illicit drugs.  Additional Social History:  Alcohol / Drug Use Pain Medications: denies Prescriptions: denies Over the Counter: denies History of alcohol / drug use?: No history of alcohol / drug abuse Longest period of sobriety (when/how long): denies Negative Consequences of Use:  (denies)  CIWA:   COWS:    PATIENT STRENGTHS: (choose at least two) Average or above average intelligence Capable of independent living Communication skills General fund of knowledge Motivation for treatment/growth Religious Affiliation Supportive family/friends Work skills  Allergies: No Known Allergies  Home Medications:  (Not in a hospital admission)  OB/GYN Status:  No LMP for male patient.  General Assessment Data [facility] TTS Assessment: In system Is this a Tele or Face-to-Face Assessment?: Face-to-Face Is this  an Initial Assessment or a Re-assessment for this encounter?: Initial Assessment Marital status: Single Living Arrangements: Non-relatives/Friends Can pt return to current living arrangement?: Yes Admission Status: Voluntary Is  patient capable of signing voluntary admission?: Yes Referral Source: Self/Family/Friend Insurance type: SP  Medical Screening Exam [facility] Walk-in ONLY) Medical Exam completed: No Reason for MSE not completed: Other: (sent for med clearance)  Crisis Care Plan Living Arrangements: Non-relatives/Friends  Education Status Is patient currently in school?: Yes Current Grade:  (graduate) Highest grade of school patient has completed:  (BA)  Risk to self with the past 6 months Suicidal Ideation: No Has patient been a risk to self within the past 6 months prior to admission? : No Suicidal Intent: No Has patient had any suicidal intent within the past 6 months prior to admission? : No Is patient at risk for suicide?: No Suicidal Plan?: No Has patient had any suicidal plan within the past 6 months prior to admission? : No Access to Means: No Previous Attempts/Gestures: No Other Self Harm Risks:  (none known) Intentional Self Injurious Behavior: None Family Suicide History: Unknown Recent stressful life event(s):  (none known) Persecutory voices/beliefs?: Yes Depression: Yes Depression Symptoms: Isolating, Fatigue, Loss of interest in usual pleasures, Feeling worthless/self pity Substance abuse history and/or treatment for substance abuse?: No Suicide prevention information given to non-admitted patients: Not applicable  Risk to Others within the past 6 months Homicidal Ideation: No Does patient have any lifetime risk of violence toward others beyond the six months prior to admission? : No Thoughts of Harm to Others: No Current Homicidal Intent: No Current Homicidal Plan: No Access to Homicidal Means: No History of harm to others?: No Assessment of Violence: None Noted Does patient have access to weapons?: No Criminal Charges Pending?: No Does patient have a court date: No Is patient on probation?: No  Psychosis Hallucinations:  (pt laughs and smiles for no reason) Delusions:  Persecutory  Mental Status Report Appearance/Hygiene: Unremarkable Eye Contact: Fair Motor Activity: Psychomotor retardation Speech: Logical/coherent Level of Consciousness: Alert Mood: Depressed, Sad Affect: Sad, Depressed, Constricted Anxiety Level: None Thought Processes: Coherent, Relevant, Thought Blocking Judgement: Partial Orientation: Person, Place, Time, Situation, Appropriate for developmental age Obsessive Compulsive Thoughts/Behaviors: None  Cognitive Functioning Concentration: Fair Memory: Remote Intact, Recent Intact IQ: Average Insight: Fair Impulse Control: Fair Appetite: Poor Weight Loss:  (unk) Weight Gain: 0 Sleep: Increased Total Hours of Sleep: 14  ADLScreening [facility] Assessment Services) Patient's cognitive ability adequate to safely complete daily activities?: Yes Patient able to express need for assistance with ADLs?: Yes Independently performs ADLs?: Yes (appropriate for developmental age)  Prior Inpatient Therapy Prior Inpatient Therapy: Yes Prior Therapy Dates:  (07/2015) Prior Therapy Facilty/Provider(s):  [facility]) Reason for Treatment:  (hallucinations)  Prior Outpatient Therapy Prior Outpatient Therapy: Yes Prior Therapy Dates: 2017 Prior Therapy Facilty/Provider(s): [facility] Reason for Treatment: hallucinations Does patient have an ACCT team?: No Does patient have Intensive In-House Services?  : No Does patient have [facility] services? : Yes Does patient have [facility] services?: No  ADL Screening (condition at time of admission) Patient's cognitive ability adequate to safely complete daily activities?: Yes Is the patient deaf or have difficulty hearing?: No Does the patient have difficulty seeing, even when wearing glasses/contacts?: No Does the patient have difficulty concentrating, remembering, or making decisions?: No Patient able to express need for assistance with ADLs?: Yes Does the patient have difficulty dressing or bathing?:  No Independently performs ADLs?: Yes (appropriate for developmental age) Does the patient have  difficulty walking or climbing stairs?: No  Home Assistive Devices/Equipment Home Assistive Devices/Equipment: None    Abuse/Neglect Assessment (Assessment to be complete while patient is alone) Physical Abuse: Denies Verbal Abuse: Denies Sexual Abuse: Denies Exploitation of patient/patient's resources: Denies Self-Neglect: Denies Values / Beliefs Cultural Requests During Hospitalization: None Spiritual Requests During Hospitalization: None   Advance Directives (For Healthcare) Does patient have an advance directive?: No Would patient like information on creating an advanced directive?: No - patient declined information    Additional Information 1:1 In Past 12 Months?: No CIRT Risk: No Elopement Risk: No Does patient have medical clearance?: No     Disposition:  Disposition Initial Assessment Completed for this Encounter: Yes Disposition of Patient: Inpatient treatment program Type of inpatient treatment program: Adult Other disposition(s):  (seek )  Elona Yinger Hines 12/14/2015 2:01 PM

## 2015-12-14 NOTE — Progress Notes (Signed)
Patient ID: Peter Golden, male   DOB: February 03, 1981, 35 y.o.   MRN: [id number] D: Client in bed this shift reports "just want to sleep" client forwards little. A: Client encouraged to get up for snacks. Medication reviewed, administered as ordered. Staff will monitor q4215min for safety. R:Client is safe on the unit.

## 2015-12-15 ENCOUNTER — Encounter (HOSPITAL_COMMUNITY): Payer: Self-pay | Admitting: Psychiatry

## 2015-12-15 DIAGNOSIS — F333 Major depressive disorder, recurrent, severe with psychotic symptoms: Secondary | ICD-10-CM

## 2015-12-15 DIAGNOSIS — F332 Major depressive disorder, recurrent severe without psychotic features: Secondary | ICD-10-CM | POA: Diagnosis present

## 2015-12-15 LAB — CBC
HEMATOCRIT: 42.6 % (ref 39.0–52.0)
Hemoglobin: 15.1 g/dL (ref 13.0–17.0)
MCH: 32.4 pg (ref 26.0–34.0)
MCHC: 35.4 g/dL (ref 30.0–36.0)
MCV: 91.4 fL (ref 78.0–100.0)
PLATELETS: 214 10*3/uL (ref 150–400)
RBC: 4.66 MIL/uL (ref 4.22–5.81)
RDW: 13.8 % (ref 11.5–15.5)
WBC: 6.1 10*3/uL (ref 4.0–10.5)

## 2015-12-15 LAB — COMPREHENSIVE METABOLIC PANEL
ALT: 10 U/L — ABNORMAL LOW (ref 17–63)
AST: 23 U/L (ref 15–41)
Albumin: 5 g/dL (ref 3.5–5.0)
Alkaline Phosphatase: 48 U/L (ref 38–126)
Anion gap: 9 (ref 5–15)
BILIRUBIN TOTAL: 3.5 mg/dL — AB (ref 0.3–1.2)
BUN: 7 mg/dL (ref 6–20)
CHLORIDE: 104 mmol/L (ref 101–111)
CO2: 25 mmol/L (ref 22–32)
Calcium: 9.7 mg/dL (ref 8.9–10.3)
Creatinine, Ser: 0.87 mg/dL (ref 0.61–1.24)
Glucose, Bld: 104 mg/dL — ABNORMAL HIGH (ref 65–99)
POTASSIUM: 4.5 mmol/L (ref 3.5–5.1)
Sodium: 138 mmol/L (ref 135–145)
TOTAL PROTEIN: 8.4 g/dL — AB (ref 6.5–8.1)

## 2015-12-15 LAB — LIPID PANEL
CHOL/HDL RATIO: 3.8 ratio
CHOLESTEROL: 261 mg/dL — AB (ref 0–200)
HDL: 69 mg/dL (ref >40)
LDL Cholesterol: 179 mg/dL — ABNORMAL HIGH (ref 0–99)
TRIGLYCERIDES: 63 mg/dL (ref <150)
VLDL: 13 mg/dL (ref 0–40)

## 2015-12-15 LAB — TSH: TSH: 1.8 u[IU]/mL (ref 0.350–4.500)

## 2015-12-15 MED ORDER — CITALOPRAM HYDROBROMIDE 10 MG PO TABS
10.0000 mg | ORAL_TABLET | Freq: Every day | ORAL | Status: DC
  Administered 2015-12-15 – 2015-12-17 (×3): 10 mg via ORAL
  Filled 2015-12-15 (×4): qty 1

## 2015-12-15 MED ORDER — BENZTROPINE MESYLATE 0.5 MG PO TABS
0.5000 mg | ORAL_TABLET | Freq: Every day | ORAL | Status: DC
  Administered 2015-12-15 – 2015-12-17 (×3): 0.5 mg via ORAL
  Filled 2015-12-15 (×4): qty 1

## 2015-12-15 MED ORDER — HALOPERIDOL 5 MG PO TABS
5.0000 mg | ORAL_TABLET | Freq: Every day | ORAL | Status: DC
  Administered 2015-12-15: 5 mg via ORAL
  Filled 2015-12-15 (×3): qty 1

## 2015-12-15 NOTE — [facility] Group Notes (Signed)
[facility] LCSW Group Therapy  12/15/2015  1:05 PM  Type of Therapy:  Group therapy  Participation Level:  Active  Participation Quality:  Attentive  Affect:  Flat  Cognitive:  Oriented  Insight:  Limited  Engagement in Therapy:  Limited  Modes of Intervention:  Discussion, Socialization  Summary of Progress/Problems:  Chaplain was here to lead a group on themes of hope and courage. "We hope for better things in the future."  Talked about going to school and how he wants to make sure to take his medicaton this time around.  Minimal participation. Daryel Geraldorth, Erasto Sleight B 12/15/2015 1:07 PM

## 2015-12-15 NOTE — Progress Notes (Signed)
NUTRITION ASSESSMENT  Pt identified as at risk on the Malnutrition Screen Tool  INTERVENTION: 1. Educated patient on the importance of nutrition and encouraged intake of food and beverages. 2. Discussed weight goals. 3. Supplements: continue Ensure Enlive po BID, each supplement provides 350 kcal and 20 grams of protein   NUTRITION DIAGNOSIS: Inadequate protein-energy intake PTA related to poor appetite, depression as evidenced by pt report.  Goal: Pt to meet >/= 90% of their estimated nutrition needs.  Monitor:  PO intake  Assessment:  Pt screened for MST. Per review, pt has gained 9 lbs over the past 6 months. Ensure Shiela Mayernlive has already been ordered BID; continue this supplement as intakes improve with medication adjustment.   35 y.o. male  Height: Ht Readings from Last 1 Encounters:  12/14/15 5' 9.8" (1.773 m)    Weight: Wt Readings from Last 1 Encounters:  12/14/15 142 lb (64.411 kg)    Weight Hx: Wt Readings from Last 10 Encounters:  12/14/15 142 lb (64.411 kg)  07/11/15 133 lb (60.328 kg)    BMI:  Body mass index is 20.49 kg/(m^2). Pt meets criteria for normal weight based on current BMI.  Estimated Nutritional Needs: Kcal: 25-30 kcal/kg Protein: > 1 gram protein/kg Fluid: 1 ml/kcal  Diet Order: Diet regular Room service appropriate?: Yes; Fluid consistency:: Thin Pt is also offered choice of unit snacks mid-morning and mid-afternoon.  Pt is eating as desired.   Lab results and medications reviewed.      Trenton GammonJessica Dandre Sisler, MS, RD, LDN Inpatient Clinical Dietitian Pager # [phone] After hours/weekend pager # [phone]

## 2015-12-15 NOTE — Progress Notes (Signed)
Patient ID: Peter Golden, male   DOB: Jul 24, 1980, 35 y.o.   MRN: [id number] D: Client has visit with a friend today, appears cheerful, "this is my friend" client sitting up in room also came to day room and did some coloring. Client reports he is in school for accounting. Client denies depression "feeling good" A: Writer provided emotional support, encouraged client to come out of room and introduced the [place] to him and he picked out which picture he wanted to color. Medication reviewed, administered as ordered. Staff to monitor q3915min for safety. R: client is safe on the unit.

## 2015-12-15 NOTE — [facility] Suicide Risk Assessment (Addendum)
[facility] Ad[facility]   Nursing information obtained from:  Patient and chart  Demographic factors:  35 year old single male, employed, in college, originally from [place]. Lives with Roommate  Current Mental Status: see below  Loss Factors:  Limited support network, stresses related to immigration ,acculturation  Historical Factors:  One prior psychiatric admission , history of depression Risk Reduction Factors:  Employed, lives with Roommate, resilience  Total Time spent with patient: 45 minutes  Principal Problem: Major depressive disorder, recurrent episode, severe (HCC) Diagnosis:   Patient Active Problem List   Diagnosis Date Noted  . Major depressive disorder, recurrent episode, severe (HCC) [F33.2] 12/15/2015  . Schizoaffective disorder, chronic condition with acute exacerbation (HCC) [F25.8] 12/14/2015  . 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 Hyperlipidemia [E78.5] 07/13/2015     Continued Clinical Symptoms:  Alcohol Use Disorder Identification Test Final Score (AUDIT): 0 The "Alcohol Use Disorders Identification Test", Guidelines for Use in Primary Care, Second Edition.  World Science writerHealth Organization [facility](WHO). Score between 0-7:  no or low risk or alcohol related problems. Score between 8-15:  moderate risk of alcohol related problems. Score between 16-19:  high risk of alcohol related problems. Score 20 or above:  warrants further diagnostic evaluation for alcohol dependence and treatment.   CLINICAL FACTORS:  Patient is a 35 year old employed male, also attending college , who lives with a roommate, who is also his main support system. He is  originally from [place], and  has been in the [place] about three years .  Presented with worsening depression, neuro-vegetative symptoms of depression. Denies hallucinations and no clear delusions expressed, but has made  statement that he feels someone is " messing with his mind ", and report is he did have psychotic symptoms during a prior depressive episode .  He is facing chronic stressors to include limited social support, acculturation issues regarding recent immigration, and juggling employment , college , personal demands.   Patient reports he had stopped taking his psychiatric medications a few weeks ago  He had one prior psychiatric admission to our unit back in January of 2017. At that time he was diagnosed with Major Depression with Psychotic Features, and was discharged on Celexa and Haldol .   Denies any personal  history of depression prior to his relocation from [place], and denies any known family history of Mood Disorder or mental illness .  Denies any medical illnesses other than Hyperlipidemia  Dx- Major Depression, Severe, with Psychotic Features   Plan - inpatient admission , medication management , resume Celexa and Haldol- patient reports he tolerated them well in the past .     Musculoskeletal: Strength & Muscle Tone: within normal limits Gait & Station: normal Patient leans: N/A  Psychiatric Specialty Exam: Physical Exam  ROS denies headache, denies chest pain, denies shortness of breath, denies vomiting, no rash   Blood pressure 126/80, pulse 95, temperature 97.8 F (36.6 C), temperature source Oral, resp. rate 16, height 5' 9.8" (1.773 m), weight 142 lb (64.411 kg), SpO2 100 %.Body mass index is 20.49 kg/(m^2).  General Appearance: Well Groomed  Eye Contact:  Good  Speech:  Normal Rate  Volume:  Normal  Mood:  Depressed  Affect:  constricted, blunted   Thought Process:  Linear  Orientation:  Other:  fully alert and attentive   Thought Content:  at this time denies hallucinations, no delusions expressed, does  not appear internally preoccupied   Suicidal Thoughts:  No- denies suicidal ideations, denies self injurious ideations   Homicidal Thoughts:  No- denies homicidal  ideations  Memory:  recent and remote grossly intact   Judgement:  Fair  Insight:  Fair  Psychomotor Activity:  Decreased  Concentration:  Concentration: Good and Attention Span: Good  Recall:  Good  Fund of Knowledge:  Good  Language:  Good  Akathisia:  Negative  Handed:  Right  AIMS (if indicated):     Assets:  Resilience  ADL's:  Intact  Cognition:  WNL  Sleep:  Number of Hours: 6.75      COGNITIVE FEATURES THAT CONTRIBUTE TO RISK:  Closed-mindedness and Loss of executive function    SUICIDE RISK:   Moderate:  Frequent suicidal ideation with limited intensity, and duration, some specificity in terms of plans, no associated intent, good self-control, limited dysphoria/symptomatology, some risk factors present, and identifiable protective factors, including available and accessible social support.  PLAN OF CARE: Patient will be admitted to inpatient psychiatric unit for stabilization and safety. Will provide and encourage milieu participation. Provide medication management and maked adjustments as needed.  Will follow daily.    I certify that inpatient services furnished can reasonably be expected to improve the patient's condition.   Nehemiah Massed, MD 12/15/2015, 1:48 PM

## 2015-12-15 NOTE — H&P (Signed)
Psychiatric Admission Assessment Adult  Patient Identification: Peter Golden  MRN:  [id number]  Date of Evaluation:  12/15/2015  Chief Complaint: "I'm depressed, sleeping all day x 2 weeks"  Principal Diagnosis: Major depressive disorder, recurrent episode, severe ([place])  Diagnosis:   Patient Active Problem List   Diagnosis Date Noted  . Schizoaffective disorder, chronic condition with acute exacerbation ([place]) [F25.8] 12/14/2015  . 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 MDD (major depressive disorder), single episode, severe with psychosis ([place]) [F32.3] 07/14/2015  . Hyperlipidemia [E78.5] 07/13/2015   History of Present Illness: Peter Golden is a 35 year old African male from [place] with hx of depression with psychotic features. He was discharged from this hospital on 07-20-15 after receiving mood stabilization treatments. He was discharged to follow-up care on an outpatient basis. Peter Golden is recurrently re-admitted to [facility] with complaints of worsening symptoms of depression. However, denies any auditory/visual hallucinations & delusional thoughts. A chart review indicated that he did mention that he felt like someone is messing with his mind. During this assessment, Peter Golden reports, "My room-mate brought me to the hospital. I have depression that worsened in the last 2 weeks. I sleep all day for the last 2 months. I'm from [place], been in the [place] x 3 years. I was not depressed when living in [place]. I got very depressed when I arrived in [place]. 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 When not in school, [place] working [place]. I was in this hospital about 3 months ago for my depression. I stopped taking my medicines about 2 weeks ago because it was making me feel too sleepy & I was feeling better. And because I was feeling better, I decided to stop the medicines. I want to re-start my medicines again, but, I don't want to feel too sleepy during the day. I have  school & work".  Objective: Peter Golden is seen, chart reviewed. He is alert, oriented x 3 & aware of situation. He is well groomed & dressed appropriately for the weather. He verbally responsive, however, speaks very softly. He makes good eye contact. He currently denies any SIHI, AVH, delusional thoughts or mood swings. He did admit to feeling & being depressed. He currently denies mood swings. He says he sleeps well, but appetite is poor. He denies any drug use, alcohol consumption or cigarette smoking. He says he receives his mental health care at [facility].  Associated Signs/Symptoms:  Depression Symptoms:  depressed mood, anhedonia, insomnia, difficulty concentrating, hopelessness, anxiety, loss of energy/fatigue, disturbed sleep, decreased appetite,  (Hypo) Manic Symptoms:  Delusions, Impulsivity,  Anxiety Symptoms:  Excessive Worry, Panic Symptoms,  Psychotic Symptoms:  Delusions, Paranoia,  PTSD Symptoms: Negative  Total Time spent with patient: 45 minutes  Past Psychiatric History: Schizoaffective disorder.  Risk to Self: Suicidal Ideation: No Suicidal Intent: No Is patient at risk for suicide?: No Suicidal Plan?: No Access to Means: No Other Self Harm Risks:  (none known) Intentional Self Injurious Behavior: None Risk to Others: Homicidal Ideation: No Thoughts of Harm to Others: No Current Homicidal Intent: No Current Homicidal Plan: No Access to Homicidal Means: No History of harm to others?: No Assessment of Violence: None Noted Does patient have access to weapons?: No Criminal Charges Pending?: No Does patient have a court date: No  Prior Inpatient Therapy: Prior Inpatient Therapy: Yes Prior Therapy Dates:  (07/2015) Prior Therapy Facilty/Provider(s):  [facility]) Reason for Treatment:  (hallucinations)  Prior Outpatient Therapy: Prior Outpatient Therapy: Yes Prior Therapy  Dates: 2017 Prior Therapy Facilty/Provider(s): [facility] Reason for Treatment:  hallucinations Does patient have an ACCT team?: No Does patient have Intensive In-House Services?  : No Does patient have [facility] services? : Yes Does patient have P4CC services?: No  Alcohol Screening: 1. How often do you have a drink containing alcohol?: Never 9. Have you or someone else been injured as a result of your drinking?: No 10. Has a relative or friend or a doctor or another health worker been concerned about your drinking or suggested you cut down?: No Alcohol Use Disorder Identification Test Final Score (AUDIT): 0 Brief Intervention: Patient declined brief intervention  Substance Abuse History in the last 12 months:  No., denies any drug or alcohol  Consequences of Substance Abuse: Negative  Previous Psychotropic Medications: No   Psychological Evaluations: No   Past Medical History: Denies any hx of medical illness.  Family History: Denies hx of HTN, DM, cardiac disease or thyroid disease in family. Family History  Problem Relation Age of Onset  . Mental illness Neg Hx    Family Psychiatric  History: Denies hx of mental illness, drug abuse or suicide in family.  Social History: Patient is single, originally from [place], [place], [place] currently lives in [place], Ship broker at [employer], living in [place] since the past 3 years. Pt denies any legal issues. Pt is religious . History  Alcohol Use No     History  Drug Use No    Social History   Social History  . Marital Status: Single    Spouse Name: N/A  . Number of Children: N/A  . Years of Education: N/A   Social History Main Topics  . Smoking status: Never Smoker   . Smokeless tobacco: Never Used  . Alcohol Use: No  . Drug Use: No  . Sexual Activity: No   Other Topics Concern  . None   Social History Narrative   Additional Social History: Pain Medications: denies Prescriptions: denies Over the Counter: denies History of alcohol / drug use?: No history of alcohol / drug abuse Longest period of sobriety (when/how  long): denies Negative Consequences of Use:  (denies)  Allergies:  No Known Allergies  Lab Results:  Results for orders placed or performed during the hospital encounter of 12/14/15 (from the past 48 hour(s))  Urinalysis, Routine w reflex microscopic (not at [facility])     Status: Abnormal   Collection Time: 12/14/15  6:20 PM  Result Value Ref Range   Color, Urine YELLOW YELLOW   APPearance CLEAR CLEAR   Specific Gravity, Urine 1.012 1.005 - 1.030   pH 5.5 5.0 - 8.0   Glucose, UA NEGATIVE NEGATIVE mg/dL   Hgb urine dipstick TRACE (A) NEGATIVE   Bilirubin Urine NEGATIVE NEGATIVE   Ketones, ur NEGATIVE NEGATIVE mg/dL   Protein, ur NEGATIVE NEGATIVE mg/dL   Nitrite NEGATIVE NEGATIVE   Leukocytes, UA NEGATIVE NEGATIVE    Comment: Performed at [facility]  Urine rapid drug screen (hosp performed)not at [facility]     Status: None   Collection Time: 12/14/15  6:20 PM  Result Value Ref Range   Opiates NONE DETECTED NONE DETECTED   Cocaine NONE DETECTED NONE DETECTED   Benzodiazepines NONE DETECTED NONE DETECTED   Amphetamines NONE DETECTED NONE DETECTED   Tetrahydrocannabinol NONE DETECTED NONE DETECTED   Barbiturates NONE DETECTED NONE DETECTED    Comment:        DRUG SCREEN FOR MEDICAL PURPOSES ONLY.  IF CONFIRMATION IS NEEDED FOR ANY PURPOSE,  NOTIFY LAB WITHIN 5 DAYS.        LOWEST DETECTABLE LIMITS FOR URINE DRUG SCREEN Drug Class       Cutoff (ng/mL) Amphetamine      1000 Barbiturate      200 Benzodiazepine   646 Tricyclics       803 Opiates          300 Cocaine          300 THC              50 Performed at [facility]   Urine microscopic-add on     Status: Abnormal   Collection Time: 12/14/15  6:20 PM  Result Value Ref Range   Squamous Epithelial / LPF 0-5 (A) NONE SEEN   WBC, UA 0-5 0 - 5 WBC/hpf   RBC / HPF 0-5 0 - 5 RBC/hpf   Bacteria, UA RARE (A) NONE SEEN   Urine-Other MUCOUS PRESENT     Comment: Performed at [facility]  CBC     Status: None   Collection Time: 12/15/15  6:27 AM  Result Value Ref Range   WBC 6.1 4.0 - 10.5 K/uL   RBC 4.66 4.22 - 5.81 MIL/uL   Hemoglobin 15.1 13.0 - 17.0 g/dL   HCT 42.6 39.0 - 52.0 %   MCV 91.4 78.0 - 100.0 fL   MCH 32.4 26.0 - 34.0 pg   MCHC 35.4 30.0 - 36.0 g/dL   RDW 13.8 11.5 - 15.5 %   Platelets 214 150 - 400 K/uL    Comment: Performed at [facility]  Comprehensive metabolic panel     Status: Abnormal   Collection Time: 12/15/15  6:27 AM  Result Value Ref Range   Sodium 138 135 - 145 mmol/L   Potassium 4.5 3.5 - 5.1 mmol/L   Chloride 104 101 - 111 mmol/L   CO2 25 22 - 32 mmol/L   Glucose, Bld 104 (H) 65 - 99 mg/dL   BUN 7 6 - 20 mg/dL   Creatinine, Ser 0.87 0.61 - 1.24 mg/dL   Calcium 9.7 8.9 - 10.3 mg/dL   Total Protein 8.4 (H) 6.5 - 8.1 g/dL   Albumin 5.0 3.5 - 5.0 g/dL   AST 23 15 - 41 U/L   ALT 10 (L) 17 - 63 U/L   Alkaline Phosphatase 48 38 - 126 U/L   Total Bilirubin 3.5 (H) 0.3 - 1.2 mg/dL   GFR calc non Af Amer >60 >60 mL/min   GFR calc Af Amer >60 >60 mL/min    Comment: (NOTE) The eGFR has been calculated using the CKD EPI equation. This calculation has not been validated in all clinical situations. eGFR's persistently <60 mL/min signify possible Chronic Kidney Disease.    Anion gap 9 5 - 15    Comment: Performed at [facility]  Lipid panel     Status: Abnormal   Collection Time: 12/15/15  6:27 AM  Result Value Ref Range   Cholesterol 261 (H) 0 - 200 mg/dL   Triglycerides 63 <150 mg/dL   HDL 69 >40 mg/dL   Total CHOL/HDL Ratio 3.8 RATIO   VLDL 13 0 - 40 mg/dL   LDL Cholesterol 179 (H) 0 - 99 mg/dL    Comment:        Total Cholesterol/HDL:CHD Risk Coronary Heart Disease Risk Table  Men   Women  1/2 Average Risk   3.4   3.3  Average Risk       5.0   4.4  2 X Average Risk   9.6   7.1  3 X Average Risk  23.4   11.0        Use the calculated Patient  Ratio above and the CHD Risk Table to determine the patient's CHD Risk.        ATP III CLASSIFICATION (LDL):  <100     mg/dL   Optimal  366-440  mg/dL   Near or Above                    Optimal  130-159  mg/dL   Borderline  347-425  mg/dL   High  >956     mg/dL   Very High Performed at [facility]   TSH     Status: None   Collection Time: 12/15/15  6:27 AM  Result Value Ref Range   TSH 1.800 0.350 - 4.500 uIU/mL    Comment: Performed at [facility]   Metabolic Disorder Labs:  Lab Results  Component Value Date   HGBA1C 5.5 07/13/2015   MPG 111 07/13/2015   Lab Results  Component Value Date   PROLACTIN 47.9* 07/13/2015   Lab Results  Component Value Date   CHOL 261* 12/15/2015   TRIG 63 12/15/2015   HDL 69 12/15/2015   CHOLHDL 3.8 12/15/2015   VLDL 13 12/15/2015   LDLCALC 179* 12/15/2015   LDLCALC 181* 07/13/2015   Current Medications: Current Facility-Administered Medications  Medication Dose Route Frequency Provider Last Rate Last Dose  . acetaminophen (TYLENOL) tablet 650 mg  650 mg Oral Q6H PRN Truman Hayward, FNP      . alum & mag hydroxide-simeth (MAALOX/MYLANTA) 200-200-20 MG/5ML suspension 30 mL  30 mL Oral Q4H PRN Truman Hayward, FNP      . feeding supplement (ENSURE ENLIVE) (ENSURE ENLIVE) liquid 237 mL  237 mL Oral BID BM Truman Hayward, FNP   237 mL at 12/14/15 1615  . hydrOXYzine (ATARAX/VISTARIL) tablet 50 mg  50 mg Oral QHS PRN Truman Hayward, FNP   50 mg at 12/14/15 2130  . magnesium hydroxide (MILK OF MAGNESIA) suspension 30 mL  30 mL Oral Daily PRN Truman Hayward, FNP       PTA Medications: Prescriptions prior to admission  Medication Sig Dispense Refill Last Dose  . benztropine (COGENTIN) 0.5 MG tablet Take 1 tablet (0.5 mg total) by mouth at bedtime. (Patient not taking: Reported on 12/15/2015) 30 tablet 0   . citalopram (CELEXA) 10 MG tablet Take 1.5 tablets (15 mg total) by mouth daily. (Patient not taking:  Reported on 12/15/2015) 45 tablet 0   . haloperidol (HALDOL) 10 MG tablet Take 1 tablet (10 mg total) by mouth at bedtime. (Patient not taking: Reported on 12/15/2015) 30 tablet 0   . hydrOXYzine (ATARAX/VISTARIL) 25 MG tablet Take 1 tablet (25 mg total) by mouth every 6 (six) hours as needed for anxiety. (Patient not taking: Reported on 12/15/2015) 30 tablet 0   . traZODone (DESYREL) 50 MG tablet Take 1 tablet (50 mg total) by mouth at bedtime as needed for sleep. (Patient not taking: Reported on 12/15/2015) 30 tablet 0    Musculoskeletal: Strength & Muscle Tone: within normal limits Gait & Station: normal Patient leans: N/A  Psychiatric Specialty Exam: Physical Exam  Constitutional: He is oriented to  person, place, and time. He appears well-developed.  I concur with PE done in ED.  HENT:  Head: Normocephalic.  Eyes: Pupils are equal, round, and reactive to light.  Neck: Normal range of motion.  Cardiovascular: Normal rate.   Respiratory: Effort normal.  GI: Soft.  Genitourinary:  Denies any issues in this area  Musculoskeletal: Normal range of motion.  Neurological: He is alert and oriented to person, place, and time.  Skin: Skin is warm and dry.  Psychiatric: His speech is normal and behavior is normal. Judgment and thought content normal. His mood appears not anxious. His affect is not angry, not blunt, not labile and not inappropriate. Cognition and memory are normal. He exhibits a depressed mood.    Review of Systems  Constitutional: Negative.   HENT: Negative.   Eyes: Negative.   Respiratory: Negative.   Cardiovascular: Negative.   Gastrointestinal: Negative.   Genitourinary: Negative.   Musculoskeletal: Negative.   Skin: Negative.   Neurological: Negative.   Endo/Heme/Allergies: Negative.   Psychiatric/Behavioral: Positive for depression (Rates #5). Negative for suicidal ideas, hallucinations, memory loss and substance abuse. The patient is not nervous/anxious and does not  have insomnia.   All other systems reviewed and are negative.   Blood pressure 126/80, pulse 95, temperature 97.8 F (36.6 C), temperature source Oral, resp. rate 16, height 5' 9.8" (1.773 m), weight 64.411 kg (142 lb), SpO2 100 %.Body mass index is 20.49 kg/(m^2).  General Appearance: Casual, neat  Eye Contact::  Fair  Speech:  Normal Rate and Slow, soft spoken  Volume:  Normal  Mood:  Depressed, rates depression #5, denies any anxiety symptoms.  Affect:  Restricted  Thought Process:  Descriptions of Associations: Intact  Orientation:  Full (Time, Place, and Person)  Thought Content:  Denies any hallucinations, delusional thoughts or paranoia  Suicidal Thoughts:  Denies  Homicidal Thoughts:  Denies  Memory:  Immediate;   Good Recent;   Good Remote;   Good  Judgement:  Intact  Insight:  Present  Psychomotor Activity:  Decreased  Concentration:  Good  Recall:  Good  Fund of Knowledge:Fair  Language: Fair  Akathisia:  Negative    AIMS (if indicated):     Assets:  Desire for Improvement Physical Health Vocational/Educational  ADL's:  Intact  Cognition: WNL  Sleep:  Number of Hours: 6.75   Treatment Plan/Recommendations: 1. Admit for crisis management and stabilization, estimated length of stay 3-5 days.  2. Medication management to reduce current symptoms to base line and improve the patient's overall level of functioning; Initiate Haldol 5 mg Q hs for mood control, Citalopram 5 mg Q hs for depression, Hydroxyzine 25 mg Q 6 hours for anxiety.  3. Treat health problems as indicated.  4. Develop treatment plan to decrease risk of relapse upon discharge and the need for readmission.  5. Psycho-social education regarding relapse prevention and self care.  6. Health care follow up as needed for medical problems.  7. Review, reconcile, and reinstate any pertinent home medications for other health issues where appropriate. 8. Call for consults with hospitalist for any additional  specialty patient care services as needed.  Observation Level/Precautions:  15 minute checks  LABS: Per ED, UDS negative for all substances.  Psychotherapy:  Individual and group therapy  Medications: Initiate Haldol 5 mg Q hs for mood control, Citalopram 5 mg Q hs for depression, Hydroxyzine 25 mg Q 6 hours for anxiety.   Consultations:  As needed  Discharge Concerns Mood stability, Safety  Length of stay: 5-7 days  Others: Admit to 500-hall.   I certify that inpatient services furnished can reasonably be expected to improve the patient's condition.   Encarnacion Slates, PMHNP, FNP-BC 6/16/20179:54 AM I have discussed case with NP and have met with patient  Agree with NP note and assessment as above  Patient is a 35 year old employed male, also attending college , who lives with a roommate, who is also his main support system. He is originally from [place], and has been in the [place] x about three years .  Presented with worsening depression, neuro-vegetative symptoms of depression. Denies hallucinations and no clear delusions expressed, but has made statement that he feels someone is " messing with his mind ", and report is he did have psychotic symptoms during a prior depressive episode . He is facing chronic stressors to include limited social support, acculturation issues regarding recent immigration, and juggling employment , college , personal demands.   Patient reports he had stopped taking his psychiatric medications a few weeks ago  He had one prior psychiatric admission to our unit back in January of 2017. At that time he was diagnosed with Major Depression with Psychotic Features, and was discharged on Celexa and Haldol .   Denies any personal history of depression prior to his relocation from [place], and denies any known family history of Mood Disorder or mental illness .  Denies any medical illnesses other than Hyperlipidemia  Dx- Major Depression, Severe, with Psychotic Features    Plan - inpatient admission , medication management , resume Celexa and Haldol- patient reports he tolerated them well in the past .

## 2015-12-15 NOTE — Tx Team (Signed)
Interdisciplinary Treatment Plan Update (Adult)  Date:  12/15/2015   Time Reviewed:  8:36 AM   Progress in Treatment: Attending groups: Yes. Participating in groups:  Yes. Taking medication as prescribed:  Yes. Tolerating medication:  Yes. Family/Significant other contact made:  No Patient understands diagnosis:  Yes  As evidenced by seeking help with depression, confusion Discussing patient identified problems/goals with staff:  Yes, see initial care plan. Medical problems stabilized or resolved:  Yes. Denies suicidal/homicidal ideation: Yes. Issues/concerns per patient self-inventory:  No. Other:  New problem(s) identified:  Discharge Plan or Barriers: see below  Reason for Continuation of Hospitalization: Depression Medication stabilization Other; describe Confusion  Comments:  Peter Golden is an 35 y.o. male reporting symptoms of depression and thoughts that someone is messing with his mind and giving him "religious problems", and he is experiencing "mental pain". Pt has a history of depression with psychotic features, and was admitted to [facility] in Jan 2017. Pt reports medication that he was d/c'd with makes him feel depressed when he is on it, but when he is not on it, he is confused.  Estimated length of stay: 4-5 days  New goal(s):  Review of initial/current patient goals per problem list:   Review of initial/current patient goals per problem list:  1. Goal(s): Patient will participate in aftercare plan   Met: Yes   Target date: 3-5 days post admission date   As evidenced by: Patient will participate within aftercare plan AEB aftercare provider and housing plan at discharge being identified. 12/15/15: Return home, follow up outpatient   2. Goal (s): Patient will exhibit decreased depressive symptoms and suicidal ideations.   Met: No   Target date: 3-5 days post admission date   As evidenced by: Patient will utilize self rating of depression at 3 or  below and demonstrate decreased signs of depression or be deemed stable for discharge by MD. 12/15/15:  Rates his depression a 7 today  States he has had no appetite and has been staying in bed during the day.  No energy.      5. Goal(s): Patient will demonstrate decreased signs of psychosis  * Met: No  * Target date: 3-5 days post admission date  * As evidenced by: Patient will demonstrate decreased frequency of AVH or return to baseline function 12/15/15:  C/O confusion, inability to focus          Attendees: Patient:  12/15/2015 8:36 AM   Family:   12/15/2015 8:36 AM   Physician:  Ursula Alert, MD 12/15/2015 8:36 AM   Nursing:   Hedy Jacob, RN 12/15/2015 8:36 AM   CSW:    Roque Lias, LCSW   12/15/2015 8:36 AM   Other:  12/15/2015 8:36 AM   Other:   12/15/2015 8:36 AM   Other:  Lars Pinks, Nurse CM 12/15/2015 8:36 AM   Other:   12/15/2015 8:36 AM   Other:  Norberto Sorenson, [place]  12/15/2015 8:36 AM   Other:  12/15/2015 8:36 AM   Other:  12/15/2015 8:36 AM   Other:  12/15/2015 8:36 AM   Other:  12/15/2015 8:36 AM   Other:  12/15/2015 8:36 AM   Other:   12/15/2015 8:36 AM    Scribe for Treatment Team:   Trish Mage, 12/15/2015 8:36 AM

## 2015-12-15 NOTE — [facility] Group Notes (Signed)
[facility]   12/15/2015 10:54 AM  Participation Quality:  Minimal  Mood/Affect:  Depressed  Depression Rating:  7  Anxiety Rating:  0  Thoughts of Suicide:  No Will you contract for safety?   NA  Current AVH:  denies  Plan for Discharge/Comments:  States he has become increasingly depressed since stopping the medication "because I was OK."  Was staying in bed, though he states he was still going to school and work.  No appetite.  Transportation Means:   Supports:  Peter Golden, Peter Golden

## 2015-12-15 NOTE — Progress Notes (Signed)
DAR NOTE: Patient presents with depressed mood and affect. Denies pain, auditory and visual hallucinations.  Rates depression at 8, hopelessness at 8, and anxiety at .  Maintained on routine safety checks.  Medications given as prescribed.  Support and encouragement offered as needed.  Attended group and participated.  States goal for today is "get help for my problems."  Patient remained in his room most of this shift.  No interaction with staff and peers.

## 2015-12-16 LAB — HEMOGLOBIN A1C
HEMOGLOBIN A1C: 5.2 % (ref 4.8–5.6)
Mean Plasma Glucose: 103 mg/dL

## 2015-12-16 LAB — PROLACTIN: Prolactin: 26.7 ng/mL — ABNORMAL HIGH (ref 4.0–15.2)

## 2015-12-16 LAB — GLUCOSE, CAPILLARY: GLUCOSE-CAPILLARY: 110 mg/dL — AB (ref 65–99)

## 2015-12-16 MED ORDER — HALOPERIDOL 2 MG PO TABS
2.0000 mg | ORAL_TABLET | Freq: Every day | ORAL | Status: DC
  Filled 2015-12-16: qty 1

## 2015-12-16 MED ORDER — ARIPIPRAZOLE 2 MG PO TABS
2.0000 mg | ORAL_TABLET | Freq: Every day | ORAL | Status: DC
  Administered 2015-12-17 – 2015-12-18 (×2): 2 mg via ORAL
  Filled 2015-12-16 (×3): qty 1

## 2015-12-16 NOTE — Progress Notes (Signed)
Peter Golden rates Anxiety 4/10 and Depression 3/10. He did not have a goal for today however he states his goal for tomorrow will be "to try and not sleep as much during the day." Denies SI/HI/AVH. Contracts for safety. Encouragement and support given. Medications administered as prescribed. Continue to monitor for patient safety and medication effectiveness.

## 2015-12-16 NOTE — Progress Notes (Signed)
DAR NOTE: Patient presents with depressed mood and affect.  Denies pain, auditory and visual hallucinations.  Rates depression at 8, hopelessness at 0, and anxiety at 0.  Maintained on routine safety checks.  Support and encouragement offered as needed.  Attended group and participated.   Patient remained in his room most of this shift.  Minimal interaction with staff.  Patient had syncope episode this morning while standing in front of the nursing station to use the phone.  Patient suddenly got dizzy, disoriented and began to lose his balance.  Patient was assisted to the chair.  Blood pressure result of 80/47 obtained.  Gatorade given for hydration and blood pressure result of 129/81 obtained.  Patient is alert and oriented x 4.   T. Melvyn NethLewis, NP notified and assessed patient.

## 2015-12-16 NOTE — [facility] Group Notes (Signed)
[facility] LCSW Group Therapy Note  12/16/2015 10:00 AM  Type of Therapy and Topic:  Group Therapy: Avoiding Self-Sabotaging and Enabling Behaviors  Participation Level:  Minimal  Participation Quality:  Attentive and Drowsy  Affect:  Depressed and Flat  Cognitive:  Oriented  Insight:  Developing/Improving  Engagement in Therapy:  Developing/Improving   Therapeutic models used Cognitive Behavioral Therapy Person-Centered Therapy Motivational Interviewing   Summary of Patient Progress: The main focus of today's process group was to explain to the adolescent what "self-sabotage" means and use Motivational Interviewing to discuss what benefits, negative or positive, were involved in a self-identified self-sabotaging behavior. Patient identified that he is looking forward to returning to school in the fall and that he has difficulty engaging socially with others as he would prefer to work or study. Patient became drowsy at midpoint of group.    Carney Bernatherine C Cachet Mccutchen, LCSW

## 2015-12-16 NOTE — Progress Notes (Signed)
[facility] MD Progress Note  12/16/2015 3:00 PM Peter Golden  MRN:  [id number] Subjective:  Patient evaluated with [place] translator via phone: " patient states I am feeling depressed and that's it."  Objective: Peter Golden is awake, alert and oriented X4 , found attending group session.  Denies suicidal or homicidal ideation. Denies auditory or visual hallucination and does not appear to be responding to internal stimuli. Patient reports history of depression and difficulties with concentration lately.  Patient reports he is medication compliant without mediation side effects.  States he depression 8/10. Patient states "I am not feeling myself today, but I am feeling better than this morning." Reports good appetite and reports resting well. Per RN notes 80/40 BP, patient denies dizziness, shortness of breath, Headache or lightheaded. - Continue to encourage hydration. Fall risk and precaution was discussed. Support, encouragement and reassurance was provided.    Principal Problem: Major depressive disorder, recurrent episode, severe ([place]) Diagnosis:   Patient Active Problem List   Diagnosis Date Noted  . Major depressive disorder, recurrent episode, severe ([place]) [F33.2] 12/15/2015  . Schizoaffective disorder, chronic condition with acute exacerbation ([place]) [F25.8] 12/14/2015  . Hyperprolactinemia ([place]) [E22.1] 07/14/2015  . MDD (major depressive disorder), single episode, severe with psychosis ([place]) [F32.3] 07/14/2015  . Hyperlipidemia [E78.5] 07/13/2015   Total Time spent with patient: 30 minutes  Past Psychiatric History: See Above  Past Medical History: History reviewed. No pertinent past medical history. History reviewed. No pertinent past surgical history. Family History:  Family History  Problem Relation Age of Onset  . Mental illness Neg Hx    Family Psychiatric  History:  Social History:  History  Alcohol Use No     History  Drug Use No    Social History    Social History  . Marital Status: Single    Spouse Name: N/A  . Number of Children: N/A  . Years of Education: N/A   Social History Main Topics  . Smoking status: Never Smoker   . Smokeless tobacco: Never Used  . Alcohol Use: No  . Drug Use: No  . Sexual Activity: No   Other Topics Concern  . None   Social History Narrative   Additional Social History:    Pain Medications: denies Prescriptions: denies Over the Counter: denies History of alcohol / drug use?: No history of alcohol / drug abuse Longest period of sobriety (when/how long): denies Negative Consequences of Use:  (denies)                    Sleep: Fair  Appetite:  Fair  Current Medications: Current Facility-Administered Medications  Medication Dose Route Frequency Provider Last Rate Last Dose  . acetaminophen (TYLENOL) tablet 650 mg  650 mg Oral Q6H PRN Nanci Pina, FNP      . alum & mag hydroxide-simeth (MAALOX/MYLANTA) 200-200-20 MG/5ML suspension 30 mL  30 mL Oral Q4H PRN Nanci Pina, FNP      . benztropine (COGENTIN) tablet 0.5 mg  0.5 mg Oral QHS Encarnacion Slates, NP   0.5 mg at 12/15/15 2112  . citalopram (CELEXA) tablet 10 mg  10 mg Oral QHS Encarnacion Slates, NP   10 mg at 12/15/15 2110  . feeding supplement (ENSURE ENLIVE) (ENSURE ENLIVE) liquid 237 mL  237 mL Oral BID BM Nanci Pina, FNP   237 mL at 12/16/15 1000  . haloperidol (HALDOL) tablet 5 mg  5 mg Oral QHS Encarnacion Slates, NP  5 mg at 12/15/15 2110  . hydrOXYzine (ATARAX/VISTARIL) tablet 50 mg  50 mg Oral QHS PRN Nanci Pina, FNP   50 mg at 12/14/15 2130  . magnesium hydroxide (MILK OF MAGNESIA) suspension 30 mL  30 mL Oral Daily PRN Nanci Pina, FNP        Lab Results:  Results for orders placed or performed during the hospital encounter of 12/14/15 (from the past 48 hour(s))  Urinalysis, Routine w reflex microscopic (not at [facility])     Status: Abnormal   Collection Time: 12/14/15  6:20 PM  Result Value Ref Range    Color, Urine YELLOW YELLOW   APPearance CLEAR CLEAR   Specific Gravity, Urine 1.012 1.005 - 1.030   pH 5.5 5.0 - 8.0   Glucose, UA NEGATIVE NEGATIVE mg/dL   Hgb urine dipstick TRACE (A) NEGATIVE   Bilirubin Urine NEGATIVE NEGATIVE   Ketones, ur NEGATIVE NEGATIVE mg/dL   Protein, ur NEGATIVE NEGATIVE mg/dL   Nitrite NEGATIVE NEGATIVE   Leukocytes, UA NEGATIVE NEGATIVE    Comment: Performed at [facility]  Urine rapid drug screen (hosp performed)not at [facility]     Status: None   Collection Time: 12/14/15  6:20 PM  Result Value Ref Range   Opiates NONE DETECTED NONE DETECTED   Cocaine NONE DETECTED NONE DETECTED   Benzodiazepines NONE DETECTED NONE DETECTED   Amphetamines NONE DETECTED NONE DETECTED   Tetrahydrocannabinol NONE DETECTED NONE DETECTED   Barbiturates NONE DETECTED NONE DETECTED    Comment:        DRUG SCREEN FOR MEDICAL PURPOSES ONLY.  IF CONFIRMATION IS NEEDED FOR ANY PURPOSE, NOTIFY LAB WITHIN 5 DAYS.        LOWEST DETECTABLE LIMITS FOR URINE DRUG SCREEN Drug Class       Cutoff (ng/mL) Amphetamine      1000 Barbiturate      200 Benzodiazepine   660 Tricyclics       630 Opiates          300 Cocaine          300 THC              50 Performed at [facility]   Urine microscopic-add on     Status: Abnormal   Collection Time: 12/14/15  6:20 PM  Result Value Ref Range   Squamous Epithelial / LPF 0-5 (A) NONE SEEN   WBC, UA 0-5 0 - 5 WBC/hpf   RBC / HPF 0-5 0 - 5 RBC/hpf   Bacteria, UA RARE (A) NONE SEEN   Urine-Other MUCOUS PRESENT     Comment: Performed at [facility]  CBC     Status: None   Collection Time: 12/15/15  6:27 AM  Result Value Ref Range   WBC 6.1 4.0 - 10.5 K/uL   RBC 4.66 4.22 - 5.81 MIL/uL   Hemoglobin 15.1 13.0 - 17.0 g/dL   HCT 42.6 39.0 - 52.0 %   MCV 91.4 78.0 - 100.0 fL   MCH 32.4 26.0 - 34.0 pg   MCHC 35.4 30.0 - 36.0 g/dL   RDW 13.8 11.5 - 15.5 %   Platelets 214 150 - 400 K/uL     Comment: Performed at [facility]  Comprehensive metabolic panel     Status: Abnormal   Collection Time: 12/15/15  6:27 AM  Result Value Ref Range   Sodium 138 135 - 145 mmol/L   Potassium 4.5 3.5 - 5.1  mmol/L   Chloride 104 101 - 111 mmol/L   CO2 25 22 - 32 mmol/L   Glucose, Bld 104 (H) 65 - 99 mg/dL   BUN 7 6 - 20 mg/dL   Creatinine, Ser 2.53 0.61 - 1.24 mg/dL   Calcium 9.7 8.9 - 66.4 mg/dL   Total Protein 8.4 (H) 6.5 - 8.1 g/dL   Albumin 5.0 3.5 - 5.0 g/dL   AST 23 15 - 41 U/L   ALT 10 (L) 17 - 63 U/L   Alkaline Phosphatase 48 38 - 126 U/L   Total Bilirubin 3.5 (H) 0.3 - 1.2 mg/dL   GFR calc non Af Amer >60 >60 mL/min   GFR calc Af Amer >60 >60 mL/min    Comment: (NOTE) The eGFR has been calculated using the CKD EPI equation. This calculation has not been validated in all clinical situations. eGFR's persistently <60 mL/min signify possible Chronic Kidney Disease.    Anion gap 9 5 - 15    Comment: Performed at [facility]  Hemoglobin A1c     Status: None   Collection Time: 12/15/15  6:27 AM  Result Value Ref Range   Hgb A1c MFr Bld 5.2 4.8 - 5.6 %    Comment: (NOTE)         Pre-diabetes: 5.7 - 6.4         Diabetes: >6.4         Glycemic control for adults with diabetes: <7.0    Mean Plasma Glucose 103 mg/dL    Comment: (NOTE) Performed At: [facility] [address] [place], [place] [id number] Mila Homer MD [id number] Performed at [facility]   Lipid panel     Status: Abnormal   Collection Time: 12/15/15  6:27 AM  Result Value Ref Range   Cholesterol 261 (H) 0 - 200 mg/dL   Triglycerides 63 <518 mg/dL   HDL 69 >84 mg/dL   Total CHOL/HDL Ratio 3.8 RATIO   VLDL 13 0 - 40 mg/dL   LDL Cholesterol 166 (H) 0 - 99 mg/dL    Comment:        Total Cholesterol/HDL:CHD Risk Coronary Heart Disease Risk Table                     Men   Women  1/2 Average Risk   3.4   3.3  Average Risk       5.0    4.4  2 X Average Risk   9.6   7.1  3 X Average Risk  23.4   11.0        Use the calculated Patient Ratio above and the CHD Risk Table to determine the patient's CHD Risk.        ATP III CLASSIFICATION (LDL):  <100     mg/dL   Optimal  063-016  mg/dL   Near or Above                    Optimal  130-159  mg/dL   Borderline  010-932  mg/dL   High  >355     mg/dL   Very High Performed at [facility]   Prolactin     Status: Abnormal   Collection Time: 12/15/15  6:27 AM  Result Value Ref Range   Prolactin 26.7 (H) 4.0 - 15.2 ng/mL    Comment: (NOTE) Performed At: [facility] [address] [place], [place] [id number] Mila Homer MD  SL:[id number] Performed at [facility]   TSH     Status: None   Collection Time: 12/15/15  6:27 AM  Result Value Ref Range   TSH 1.800 0.350 - 4.500 uIU/mL    Comment: Performed at [facility]  Glucose, capillary     Status: Abnormal   Collection Time: 12/16/15  8:55 AM  Result Value Ref Range   Glucose-Capillary 110 (H) 65 - 99 mg/dL    Blood Alcohol level:  Lab Results  Component Value Date   ETH <5 07/10/2015    Physical Findings: AIMS: Facial and Oral Movements Muscles of Facial Expression: None, normal Lips and Perioral Area: None, normal Jaw: None, normal Tongue: None, normal,Extremity Movements Upper (arms, wrists, hands, fingers): None, normal Lower (legs, knees, ankles, toes): None, normal, Trunk Movements Neck, shoulders, hips: None, normal, Overall Severity Severity of abnormal movements (highest score from questions above): None, normal Incapacitation due to abnormal movements: None, normal Patient's awareness of abnormal movements (rate only patient's report): No Awareness, Dental Status Current problems with teeth and/or dentures?: No Does patient usually wear dentures?: No  CIWA:    COWS:     Musculoskeletal: Strength & Muscle Tone: within normal limits Gait  & Station: normal Patient leans: N/A  Psychiatric Specialty Exam: Physical Exam  Nursing note and vitals reviewed. Constitutional: He is oriented to person, place, and time. He appears well-developed.  Neck: Normal range of motion.  Cardiovascular: Normal rate.   Musculoskeletal: Normal range of motion.  Neurological: He is alert and oriented to person, place, and time.  Skin: Skin is warm.  Psychiatric: He has a normal mood and affect. His behavior is normal.    Review of Systems  Psychiatric/Behavioral: Positive for depression. Negative for suicidal ideas and hallucinations. The patient is nervous/anxious and has insomnia.   All other systems reviewed and are negative.   Blood pressure 136/74, pulse 114, temperature 98.7 F (37.1 C), temperature source Oral, resp. rate 17, height 5' 9.8" (1.773 m), weight 64.411 kg (142 lb), SpO2 100 %.Body mass index is 20.49 kg/(m^2).  General Appearance: Guarded  Eye Contact:  Fair  Speech:  language barrier . patient speaks french  Volume:  Normal  Mood:  Depressed  Affect:  Depressed and Flat  Thought Process:  Coherent  Orientation:  Full (Time, Place, and Person)  Thought Content:  Hallucinations: None  Suicidal Thoughts:  No  Homicidal Thoughts:  No  Memory:  Immediate;   Fair Recent;   Fair Remote;   Fair  Judgement:  Fair  Insight:  Fair  Psychomotor Activity:  Restlessness  Concentration:  Concentration: Poor  Recall:  [employer] of Knowledge:  Fair  Language:  Fair  Akathisia:  No  Handed:  Right  AIMS (if indicated):     Assets:  Communication Skills Desire for Improvement Resilience Social Support  ADL's:  Intact  Cognition:  WNL  Sleep:  Number of Hours: 6.75     I agree with current treatment plan on 12/16/2015, Patient seen face-to-face for psychiatric evaluation follow-up, chart reviewed. Reviewed the information documented and agree with the treatment plan.  Treatment Plan Summary: Daily contact with  patient to assess and evaluate symptoms and progress in treatment and Medication management Encourage hydration and monitor BP. Discontinue Abilify 5 mg to mg for mood stabilization and Start Abilify 2 mg PO daily with titration. Continue Celexa 10 mg PO QD for mood stabilization. Continue with hydroxyzine 50 mg for insomnia Will continue  to monitor vitals ,medication compliance and treatment side effects while patient is here.  Reviewed labs Glucose 104 elevated, Lipid elevated, Prolactin 26.7 elevated. BAL - 0, UDS - negative Patient to participate in therapeutic milieu  D[place]ill [place], NP 12/16/2015, 3:00 PM I agreed with findings and treatment plan of this patient

## 2015-12-16 NOTE — [facility] Group Notes (Signed)
[facility] Group Notes:  (Nursing/MHT/Case Management/Adjunct)  Date:  12/16/2015  Time:  5:55 PM  Type of Therapy:  Nurse Education  Participation Level:  Active  Participation Quality:  Appropriate and Attentive  Affect:  Appropriate  Cognitive:  Alert and Appropriate  Insight:  Appropriate and Good  Engagement in Group:  Engaged  Modes of Intervention:  Discussion and Education  Summary of Progress/Problems: Topic was healthy coping skills.  Discussed the importance of learning healthy coping skills and encouraged group to use the power of positive thinking and attitude to their benefit.  Patient was receptive and attentive throughout.     Peter Golden 12/16/2015, 5:55 PM

## 2015-12-17 ENCOUNTER — Other Ambulatory Visit: Payer: Self-pay

## 2015-12-17 DIAGNOSIS — F333 Major depressive disorder, recurrent, severe with psychotic symptoms: Secondary | ICD-10-CM

## 2015-12-17 NOTE — [facility] Group Notes (Signed)
[facility] LCSW Group Therapy  12/17/2015 10:30 AM  Type of Therapy:  Group Therapy  Participation Level:  None  Participation Quality:  Attentive and Inattentive  Affect:  Flat  Cognitive:  Appropriate  Insight:  None  Engagement in Therapy:  None  Modes of Intervention:  Discussion   Summary of Progress/Problems: Group today was about identifying happiness/goals. Discussed with group plans for happiness. They were given the opportunity to talk about what made them happy. They shared things like family, health and money. They were able to discuss obstacles to happiness. As well as identify supports and creating plans to full happiness or other goals. Patient did not participate in group but was present.   Beverly SessionsLINDSEY, Cristian Grieves J 12/17/2015, 11:51 AM

## 2015-12-17 NOTE — Progress Notes (Signed)
Child/Adolescent Psychoeducational Group Note  Date:  12/17/2015 Time:  9:00 PM  Group Topic/Focus:  Wrap-Up Group:   The focus of this group is to help patients review their daily goal of treatment and discuss progress on daily workbooks.  Participation Level:  Active  Participation Quality:  Appropriate and Sharing  Affect:  Appropriate  Cognitive:  Alert and Appropriate  Insight:  Appropriate  Engagement in Group:  Engaged  Modes of Intervention:  Discussion  Additional Comments:  Pt shared that his day was "wonderful." Pt wants to work on improving his concentration and be able to focus better. Pt shared that he is sleeping well. He also shared that his medication was making him extremely sleeping, but is has been changed and he feels better.   Burman FreestoneCraddock, Mehtaab Mayeda L 12/17/2015, 9:00 PM

## 2015-12-17 NOTE — Progress Notes (Signed)
DAR NOTE: Patient presents with depressed mood and affect.  Denies pain, auditory and visual hallucinations.  Rates depression at 2, hopelessness at 10, and anxiety at 0.  Maintained on routine safety checks.  Described energy level as normal and concentration as good.  Medications given as prescribed.  Support and encouragement offered as needed.  Attended group and participated.  States goal for today is "discharge."  Patient voiced concern about missing classes and school work.  Verbalizes readiness for discharge.  Offered no complaint.

## 2015-12-17 NOTE — Progress Notes (Signed)
[facility]  12/17/2015 1:10 PM Peter Golden  MRN:  [id number] Subjective:  Patient evaluated with [place] translator via phone: " patient states I am feeling better and [facility] so I need [facility] be discharged"  Objective: Peter Golden is awake, alert and oriented X4 , found attending group session.  Denies suicidal or homicidal ideation. Denies auditory or visual hallucination and does not appear to be responding to internal stimuli. Patient reports history of depression and difficulties with concentration lately. Patient reports I was admitted to get my medication on track.  Patient reports he is medication compliant without mediation side effects.  States his depression 7/10.  Reports good appetite and reports resting well. Support, encouragement and reassurance was provided.    Principal Problem: Major depressive disorder, recurrent episode, severe ([facility]) Diagnosis:   Patient Active Problem List   Diagnosis Date Noted  . Severe episode of recurrent major depressive disorder, with psychotic features ([facility]) [F33.3]   . Major depressive disorder, recurrent episode, severe ([facility]) [F33.2] 12/15/2015  . Schizoaffective disorder, chronic condition with acute exacerbation ([facility]) [F25.8] 12/14/2015  . 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 Hyperlipidemia [E78.5] 07/13/2015   Total Time spent with patient: 30 minutes  Past Psychiatric History: See Above  Past Medical History: History reviewed. No pertinent past medical history. History reviewed. No pertinent past surgical history. Family History:  Family History  Problem Relation Age of Onset  . Mental illness Neg Hx    Family Psychiatric  History:  Social History:  History  Alcohol Use No     History  Drug Use No    Social History   Social History  . Marital Status: Single    Spouse Name: N/A  .  Number of Children: N/A  . Years of Education: N/A   Social History Main Topics  . Smoking status: Never Smoker   . Smokeless tobacco: Never Used  . Alcohol Use: No  . Drug Use: No  . Sexual Activity: No   Other Topics Concern  . None   Social History Narrative   Additional Social History:    Pain Medications: denies Prescriptions: denies Over the Counter: denies History of alcohol / drug use?: No history of alcohol / drug abuse Longest period of sobriety (when/how long): denies Negative Consequences of Use:  (denies)                    Sleep: Fair  Appetite:  Fair  Current Medications: Current Facility-Administered Medications  Medication Dose Route Frequency Provider Last Rate Last Dose  . acetaminophen (TYLENOL) tablet 650 mg  650 mg Oral Q6H PRN Truman Haywardakia S Starkes, FNP      . alum & mag hydroxide-simeth (MAALOX/MYLANTA) 200-200-20 MG/5ML suspension 30 mL  30 mL Oral Q4H PRN Truman Haywardakia S Starkes, FNP      . ARIPiprazole (ABILIFY) tablet 2 mg  2 mg Oral Daily Oneta Rackanika N Lewis, NP   2 mg at 12/17/15 [id number]  . benztropine (COGENTIN) tablet 0.5 mg  0.5 mg Oral QHS Sanjuana KavaAgnes I Nwoko, NP   0.5 mg at 12/16/15 2157  . citalopram (CELEXA) tablet 10 mg  10 mg Oral QHS Sanjuana KavaAgnes I Nwoko, NP   10 mg at 12/16/15 2157  . feeding supplement (ENSURE ENLIVE) (ENSURE ENLIVE) liquid 237 mL  237 mL Oral BID BM Truman Haywardakia S Starkes, FNP   237 mL at 12/17/15  1055  . hydrOXYzine (ATARAX/VISTARIL) tablet 50 mg  50 mg Oral QHS PRN Truman Hayward, FNP   50 mg at 12/14/15 2130  . magnesium hydroxide (MILK OF MAGNESIA) suspension 30 mL  30 mL Oral Daily PRN Truman Hayward, FNP        Lab Results:  Results for orders placed or performed during the hospital encounter of 12/14/15 (from the past 48 hour(s))  Glucose, capillary     Status: Abnormal   Collection Time: 12/16/15  8:55 AM  Result Value Ref Range   Glucose-Capillary 110 (H) 65 - 99 mg/dL    Blood Alcohol level:  Lab Results  Component Value Date    ETH <5 07/10/2015    Physical Findings: AIMS: Facial and Oral Movements Muscles of Facial Expression: None, normal Lips and Perioral Area: None, normal Jaw: None, normal Tongue: None, normal,Extremity Movements Upper (arms, wrists, hands, fingers): None, normal Lower (legs, knees, ankles, toes): None, normal, Trunk Movements Neck, shoulders, hips: None, normal, Overall Severity Severity of abnormal movements (highest score from questions above): None, normal Incapacitation due to abnormal movements: None, normal Patient's awareness of abnormal movements (rate only patient's report): No Awareness, Dental Status Current problems with teeth and/or dentures?: No Does patient usually wear dentures?: No  CIWA:    COWS:     Musculoskeletal: Strength & Muscle Tone: within normal limits Gait & Station: normal Patient leans: N/A  Psychiatric Specialty Exam: Physical Exam  Nursing note and vitals reviewed. Constitutional: He is oriented to person, place, and time. He appears well-developed.  Neck: Normal range of motion.  Cardiovascular: Normal rate.   Musculoskeletal: Normal range of motion.  Neurological: He is alert and oriented to person, place, and time.  Skin: Skin is warm.  Psychiatric: He has a normal mood and affect. His behavior is normal.    Review of Systems  Psychiatric/Behavioral: Positive for depression. Negative for suicidal ideas and hallucinations. The patient is nervous/anxious and has insomnia.   All other systems reviewed and are negative.   Blood pressure 113/66, pulse 97, temperature 98.5 F (36.9 C), temperature source Oral, resp. rate 14, height 5' 9.8" (1.773 m), weight 64.411 kg (142 lb), SpO2 100 %.Body mass index is 20.49 kg/(m^2).  General Appearance: Guarded  Eye Contact:  Fair  Speech:  language barrier . patient speaks french and some english  Volume:  Normal  Mood:  Depressed  Affect:  Depressed and Flat  Thought Process:  Coherent   Orientation:  Full (Time, Place, and Person)  Thought Content:  Hallucinations: None  Suicidal Thoughts:  No  Homicidal Thoughts:  No  Memory:  Immediate;   Fair Recent;   Fair Remote;   Fair  Judgement:  Fair  Insight:  Fair  Psychomotor Activity:  Restlessness  Concentration:  Concentration: Poor  Recall:  [employer] of Knowledge:  Fair  Language:  Fair  Akathisia:  No  Handed:  Right  AIMS (if indicated):     Assets:  Communication Skills Desire for Improvement Resilience Social Support  ADL's:  Intact  Cognition:  WNL  Sleep:  Number of Hours: 6.75     I agree with current treatment plan on 12/17/2015, Patient seen face-to-face for psychiatric evaluation follow-up, chart reviewed. Reviewed the information documented and agree with the treatment plan.  Treatment Plan Summary: Daily contact with patient to assess and evaluate symptoms and progress in treatment and Medication management Encourage hydration and monitor BP. Discontinue Haldol 5 mg to mg for mood  stabilization and Continue Abilify 2 mg PO daily with titration. Continue Celexa 10 mg PO QD for mood stabilization. Continue with hydroxyzine 50 mg for insomnia Will continue to monitor vitals ,medication compliance and treatment side effects while patient is here.  Reviewed labs Glucose 104 elevated, Lipid elevated, Prolactin 26.7 elevated. BAL - 0, UDS - negative Patient to participate in therapeutic milieu  Oneta Rack, NP 12/17/2015, 1:10 PM I agreed with findings and treatment plan of this patient

## 2015-12-18 MED ORDER — CITALOPRAM HYDROBROMIDE 10 MG PO TABS: 10.0000 mg | ORAL_TABLET | Freq: Every day | ORAL | Status: DC

## 2015-12-18 MED ORDER — HYDROXYZINE HCL 50 MG PO TABS: 50.0000 mg | ORAL_TABLET | Freq: Every evening | ORAL | Status: DC | PRN

## 2015-12-18 MED ORDER — BENZTROPINE MESYLATE 0.5 MG PO TABS
0.5000 mg | ORAL_TABLET | Freq: Every day | ORAL | Status: DC
Start: 2015-12-18 — End: 2017-07-09

## 2015-12-18 MED ORDER — ARIPIPRAZOLE 2 MG PO TABS: 2.0000 mg | ORAL_TABLET | Freq: Every day | ORAL | Status: DC

## 2015-12-18 NOTE — Progress Notes (Signed)
D. Pt had been up and visible in milieu this evening, shared that he had a good day and spoke about how he was feeling his medications were not working proper for him and spoke about how he was feeling tired all the time and spoke about how he needed to come in and get his medications adjusted. Pt did receive all bedtime medications without incident and did not verbalize any complaints of pain. A. Support and encouragement provided. R. Safety maintained, will continue to monitor.

## 2015-12-18 NOTE — Discharge Summary (Signed)
Physician Discharge Summary Note  Patient:  Peter Golden is an 35 y.o., male MRN:  [id number] DOB:  [birth year] Patient phone:  [phone] (home)  Patient address:   [id number] [place] [id number],  Total Time spent with patient: 30 minutes  Date of Admission:  12/14/2015 Date of Discharge: 12/18/2015  Reason for Admission: depression  Principal Problem: Major depressive disorder, recurrent episode, severe [facility](HCC) Discharge Diagnoses: Patient Active Problem List   Diagnosis Date Noted  . Severe episode of recurrent major depressive disorder, with psychotic features (HCC) [F33.3]   . Major depressive disorder, recurrent episode, severe (HCC) [F33.2] 12/15/2015  . Schizoaffective disorder, chronic condition with acute exacerbation (HCC) [F25.8] 12/14/2015  . 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 Hyperlipidemia [E78.5] 07/13/2015    Past Psychiatric History: see HPI  Past Medical History: History reviewed. No pertinent past medical history. History reviewed. No pertinent past surgical history. Family History:  Family History  Problem Relation Age of Onset  . Mental illness Neg Hx    Family Psychiatric  History: see HPI Social History:  History  Alcohol Use No     History  Drug Use No    Social History   Social History  . Marital Status: Single    Spouse Name: N/A  . Number of Children: N/A  . Years of Education: N/A   Social History Main Topics  . Smoking status: Never Smoker   . Smokeless tobacco: Never Used  . Alcohol Use: No  . Drug Use: No  . Sexual Activity: No   Other Topics Concern  . None   Social History Narrative    Hospital Course:   Peter Golden is a 35 year old African male from [place] with hx of depression with psychotic features. [facility] after receiving mood stabilization treatments. He was  discharged to follow-up care on an outpatient basis. Peter Golden is recurrent[facility] with complaints of worsening symptoms of depression.  Peter Golden was admitted for Major depressive disorder, recurrent episode, severe (HCC) and crisis management.  He was treated with Abilify 20 mg psychosis, moos stabilization.  Cogentin 0.5 mg EPS, Hydroxyzine 50 mg anxiety.  Medical problems were identified and treated as needed.  Home medications were restarted as appropriate.  Improvement was monitored by observation and Peter Golden daily report of symptom reduction.  Emotional and mental status was monitored by daily self inventory reports completed by Peter Golden and clinical staff.  Patient reported continued improvement, denied any new concerns.  Patient had been compliant on medications and denied side effects.  Support and encouragement was provided.    Patient did well during inpatient stay.  At time of discharge, patient rated both depression and anxiety levels to be manageable and minimal.  Patient encouraged to attend groups to help with recognizing triggers of emotional crises and de-stabilizations.  Patient encouraged to attend group to help identify the positive things in life that would help in dealing with feelings of loss, depression and unhealthy or abusive tendencies.         Peter Golden was evaluated by the treatment team for stability and plans for continued recovery upon discharge.  He was offered further treatment options upon discharge including Residential, Intensive Outpatient and Outpatient treatment.  He will follow up with agencies listed below for medication management and counseling.  Encouraged patient to maintain  satisfactory support network and home environment.  Advised to adhere to medication compliance and outpatient treatment follow up.      Peter Golden motivation was an integral factor for scheduling further treatment.   Employment, transportation, bed availability, health status, family support, and any pending legal issues were also considered during his hospital stay.  Upon completion of this admission the patient was both mentally and medically stable for discharge denying suicidal/homicidal ideation, auditory/visual/tactile hallucinations, delusional thoughts and paranoia.      Physical Findings: AIMS: Facial and Oral Movements Muscles of Facial Expression: None, normal Lips and Perioral Area: None, normal Jaw: None, normal Tongue: None, normal,Extremity Movements Upper (arms, wrists, hands, fingers): None, normal Lower (legs, knees, ankles, toes): None, normal, Trunk Movements Neck, shoulders, hips: None, normal, Overall Severity Severity of abnormal movements (highest score from questions above): None, normal Incapacitation due to abnormal movements: None, normal Patient's awareness of abnormal movements (rate only patient's report): No Awareness, Dental Status Current problems with teeth and/or dentures?: No Does patient usually wear dentures?: No  CIWA:    COWS:     Musculoskeletal: Strength & Muscle Tone: within normal limits Gait & Station: normal Patient leans: N/A  Psychiatric Specialty Exam:  SEE MD SRA Physical Exam  Vitals reviewed. Psychiatric: Thought content is not paranoid. He does not exhibit a depressed mood. He expresses no homicidal and no suicidal ideation.    Review of Systems  Psychiatric/Behavioral: Negative for depression, suicidal ideas and substance abuse. The patient does not have insomnia.   All other systems reviewed and are negative.   Blood pressure 115/74, pulse 97, temperature 98.4 F (36.9 C), temperature source Oral, resp. rate 16, height 5' 9.8" (1.773 m), weight 64.411 kg (142 lb), SpO2 100 %.Body mass index is 20.49 kg/(m^2).   Have you used any form of tobacco in the last 30 days? (Cigarettes, Smokeless Tobacco, Cigars, and/or Pipes): No  Has this  patient used any form of tobacco in the last 30 days? (Cigarettes, Smokeless Tobacco, Cigars, and/or Pipes) Yes, NA  Blood Alcohol level:  Lab Results  Component Value Date   ETH <5 07/10/2015    Metabolic Disorder Labs:  Lab Results  Component Value Date   HGBA1C 5.2 12/15/2015   MPG 103 12/15/2015   MPG 111 07/13/2015   Lab Results  Component Value Date   PROLACTIN 26.7* 12/15/2015   PROLACTIN 47.9* 07/13/2015   Lab Results  Component Value Date   CHOL 261* 12/15/2015   TRIG 63 12/15/2015   HDL 69 12/15/2015   CHOLHDL 3.8 12/15/2015   VLDL 13 12/15/2015   LDLCALC 179* 12/15/2015   LDLCALC 181* 07/13/2015    See Psychiatric Specialty Exam and Suicide Risk Assessment completed by Attending Physician prior to discharge.  Discharge destination:  Home  Is patient on multiple antipsychotic therapies at discharge:  No   Has Patient had three or more failed trials of antipsychotic monotherapy by history:  No  Recommended Plan for Multiple Antipsychotic Therapies: NA     Medication List    STOP taking these medications        haloperidol 10 MG tablet  Commonly known as:  HALDOL     traZODone 50 MG tablet  Commonly known as:  DESYREL      TAKE these medications      Indication   ARIPiprazole 2 MG tablet  Commonly known as:  ABILIFY  Take 1 tablet (2 mg total) by mouth daily.   Indication:  Major Depressive  Disorder     benztropine 0.5 MG tablet  Commonly known as:  COGENTIN  Take 1 tablet (0.5 mg total) by mouth at bedtime.   Indication:  Extrapyramidal Reaction caused by Medications     citalopram 10 MG tablet  Commonly known as:  CELEXA  Take 1 tablet (10 mg total) by mouth at bedtime.   Indication:  Depression     hydrOXYzine 50 MG tablet  Commonly known as:  ATARAX/VISTARIL  Take 1 tablet (50 mg total) by mouth at bedtime as needed (sleep).   Indication:  Anxiety Neurosis           Follow-up Information    Follow up with [facility].    Specialty:  Behavioral Health   Why:  Go to the walk-in clinic M-F between 8 and 10AM for your hospital follow up appointment. You will need to go here to be opened for services.  You will likely not be able to afford your Abilify without help from tghem   Contact information:   [address] [place] [address] [phone]       Follow-up recommendations:  Activity:  as tol Diet:  as tol  Comments:  1.  Take all your medications as prescribed.   2.  Report any adverse side effects to outpatient provider. 3.  Patient instructed to not use alcohol or illegal drugs while on prescription medicines. 4.  In the event of worsening symptoms, instructed patient to call 911, the crisis hotline or go to nearest emergency room for evaluation of symptoms.  Signed: Lindwood Qua, NP [facility] 12/18/2015, 10:20 AM

## 2015-12-18 NOTE — Tx Team (Signed)
Interdisciplinary Treatment Plan Update (Adult)  Date:  12/18/2015   Time Reviewed:  10:09 AM   Progress in Treatment: Attending groups: Yes. Participating in groups:  Yes. Taking medication as prescribed:  Yes. Tolerating medication:  Yes. Family/Significant other contact made:  No Patient understands diagnosis:  Yes  As evidenced by seeking help with depression, confusion Discussing patient identified problems/goals with staff:  Yes, see initial care plan. Medical problems stabilized or resolved:  Yes. Denies suicidal/homicidal ideation: Yes. Issues/concerns per patient self-inventory:  No. Other:  New problem(s) identified:  Discharge Plan or Barriers: see below  Reason for Continuation of Hospitalization:   Comments:  Peter Golden is an 34 y.o. male reporting symptoms of depression and thoughts that someone is messing with his mind and giving him "religious problems", and he is experiencing "mental pain". Pt has a history of depression with psychotic features, and was admitted to [facility] in Jan 2017. Pt reports medication that he was d/c'd with makes him feel depressed when he is on it, but when he is not on it, he is confused.  Estimated length of stay: D/C today  New goal(s):  Review of initial/current patient goals per problem list:   Review of initial/current patient goals per problem list:  1. Goal(s): Patient will participate in aftercare plan   Met: Yes   Target date: 3-5 days post admission date   As evidenced by: Patient will participate within aftercare plan AEB aftercare provider and housing plan at discharge being identified. 12/15/15: Return home, follow up outpatient   2. Goal (s): Patient will exhibit decreased depressive symptoms and suicidal ideations.   Met: Yes   Target date: 3-5 days post admission date   As evidenced by: Patient will utilize self rating of depression at 3 or below and demonstrate decreased signs of depression or be  deemed stable for discharge by MD. 12/15/15:  Rates his depression a 7 today  States he has had no appetite and has been staying in bed during the day.  No energy. 12/18/15:  Pt denies depression today      5. Goal(s): Patient will demonstrate decreased signs of psychosis  * Met: Yes  * Target date: 3-5 days post admission date  * As evidenced by: Patient will demonstrate decreased frequency of AVH or return to baseline function 12/15/15:  C/O confusion, inability to focus 12/18/15:  No signs nor symptoms of psychosis today          Attendees: Patient:  12/18/2015 10:09 AM   Family:   12/18/2015 10:09 AM   Physician:  Ursula Alert, MD 12/18/2015 10:09 AM   Nursing:   Jeanie Cooks, RN 12/18/2015 10:09 AM   CSW:    Roque Lias, LCSW   12/18/2015 10:09 AM   Other:  12/18/2015 10:09 AM   Other:   12/18/2015 10:09 AM   Other:  Lars Pinks, Nurse CM 12/18/2015 10:09 AM   Other:   12/18/2015 10:09 AM   Other:  Norberto Sorenson, [place]  12/18/2015 10:09 AM   Other:  12/18/2015 10:09 AM   Other:  12/18/2015 10:09 AM   Other:  12/18/2015 10:09 AM   Other:  12/18/2015 10:09 AM   Other:  12/18/2015 10:09 AM   Other:   12/18/2015 10:09 AM    Scribe for Treatment Team:   Trish Mage, 12/18/2015 10:09 AM

## 2015-12-18 NOTE — Progress Notes (Signed)
Pt discharged home with his room mate. Pt was ambulatory, stable and appreciative at that time. All papers and prescriptions were given and valuables returned. Verbal understanding expressed. Denies SI/HI and A/VH. Pt given opportunity to express concerns and ask questions.

## 2015-12-18 NOTE — [facility] Suicide Risk Assessment (Signed)
[facility]   Principal Problem: Severe episode of recurrent major depressive disorder, with psychotic features [facility](HCC) Discharge Diagnoses:  Patient Active Problem List   Diagnosis Date Noted  . Severe episode of recurrent major depressive disorder, with psychotic features (HCC) [F33.3]   . 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 Hyperlipidemia [E78.5] 07/13/2015    Total Time spent with patient: 30 minutes  Musculoskeletal: Strength & Muscle Tone: within normal limits Gait & Station: normal Patient leans: N/A  Psychiatric Specialty Exam: Review of Systems  Psychiatric/Behavioral: Negative for depression, suicidal ideas and hallucinations. The patient is not nervous/anxious.   All other systems reviewed and are negative.   Blood pressure 115/74, pulse 97, temperature 98.4 F (36.9 C), temperature source Oral, resp. rate 16, height 5' 9.8" (1.773 m), weight 64.411 kg (142 lb), SpO2 100 %.Body mass index is 20.49 kg/(m^2).  General Appearance: Casual  Eye Contact::  Fair  Speech:  Clear and Coherent409  Volume:  Normal  Mood:  Euthymic  Affect:  Congruent  Thought Process:  Goal Directed  Orientation:  Full (Time, Place, and Person)  Thought Content:  Logical  Suicidal Thoughts:  No  Homicidal Thoughts:  No  Memory:  Immediate;   Fair Recent;   Fair Remote;   Fair  Judgement:  Fair  Insight:  Fair  Psychomotor Activity:  Normal  Concentration:  Fair  Recall:  Fis[employer]Fair  Fund of Knowledge:Fair  Language: Fair  Akathisia:  No  Handed:  Right  AIMS (if indicated):     Assets:  Desire for Improvement  Sleep:  Number of Hours: 6.75  Cognition: WNL  ADL's:  Intact   Mental Status Per Nursing Assessment::   On Admission:  NA  Demographic Factors:  Male  Loss Factors: NA  Historical Factors: Impulsivity  Risk Reduction Factors:   Positive social support  Continued Clinical Symptoms:  Previous Psychiatric Diagnoses and  Treatments  Cognitive Features That Contribute To Risk:  None    Suicide Risk:  Minimal: No identifiable suicidal ideation.  Patients presenting with no risk factors but with morbid ruminations; may be classified as minimal risk based on the severity of the depressive symptoms  Follow-up Information    [facility]   Specialty:  Behavioral Health   Why:  Go to the walk-in clinic M-F between 8 and 10AM for your hospital follow up appointment. You will need to go here to be opened for services.  You will likely not be able to afford your Abilify without help from t[facility]hem   Contact information:   [address] [place] [address] [phone]       Plan Of Care/Follow-up recommendations:  Activity:  no restrictions Diet:  regular Tests:  follow up on Prolactin level as per out patient recommendations Other:  none  Jamiah Homeyer, MD 12/18/2015, 10:39 AM

## 2015-12-18 NOTE — Progress Notes (Signed)
  [facility] Dis[facility] :  Will you be returning to the same living situation after discharge:  Yes,  home At discharge, do you have transportation home?: Yes,  friend Do you have the ability to pay for your medications: Yes,  mental health  Release of information consent forms completed and in the chart;  Patient's signature needed at discharge.  Patient to Follow up at: Follow-up Information    [facility]   Specialty:  Behavioral Health   Why:  Go to the walk-in clinic M-F between 8 and 10AM for your hospital follow up appointment. You will need to go here to be opened for services.  You will likely not be able to afford your Abilify without help from t[facility]hem   Contact information:   [address] [place] [address] [phone]       Next level of care provider has access to [facility] Link:no  Safety Planning and Suicide Prevention discussed: Yes,  yes  Have you used any form of tobacco in the last 30 days? (Cigarettes, Smokeless Tobacco, Cigars, and/or Pipes): No  Has patient been referred to the Quitline?: N/A patient is not a smoker  Patient has been referred for addiction treatment: N/A  Daryel Geraldorth, Monetta Lick B 12/18/2015, 10:14 AM

## 2015-12-18 NOTE — B[facility] Suicide Risk Assessment (Signed)
B[facility] INPATIENT:  Family/Significant Other Suicide Prevention Education  Suicide Prevention Education:  Education Completed; No one has been identified by the patient as the family member/significant other with whom the patient will be residing, and identified as the person(s) who will aid the patient in the event of a mental health crisis (suicidal ideations/suicide attempt).  With written consent from the patient, the family member/significant other has been provided the following suicide prevention education, prior to the and/or following the discharge of the patient.  The suicide prevention education provided includes the following:  Suicide risk factors  Suicide prevention and interventions  National Suicide Hotline telephone number  [facility] assessment telephone number  [facility] Emergency Assistance 911  [facility] and/or Residential Mobile Crisis Unit telephone number  Request made of family/significant other to:  Remove weapons (e.g., guns, rifles, knives), all items previously/currently identified as safety concern.    Remove drugs/medications (over-the-counter, prescriptions, illicit drugs), all items previously/currently identified as a safety concern.  The family member/significant other verbalizes understanding of the suicide prevention education information provided.  The family member/significant other agrees to remove the items of safety concern listed above. The patient did not endorse SI at the time of admission, nor did the patient c/o SI during the stay here.  SPE not required. However, i did try to reach out to [facility], friend and support of patient, [phone], and left vm message.  Will go over treatment team recommendations and crises plan if/when she calls back.  Daryel Geraldorth, Cloys Vera B 12/18/2015, 10:07 AM

## 2017-06-04 ENCOUNTER — Emergency Department (HOSPITAL_COMMUNITY)
Admission: EM | Admit: 2017-06-04 | Discharge: 2017-06-06 | Disposition: A | Discharge status: Other Acute Inpatient Hospital | Payer: Self-pay | Attending: Emergency Medicine | Admitting: Emergency Medicine

## 2017-06-04 ENCOUNTER — Other Ambulatory Visit: Payer: Self-pay

## 2017-06-04 ENCOUNTER — Encounter (HOSPITAL_COMMUNITY): Payer: Self-pay | Admitting: Nurse Practitioner

## 2017-06-04 DIAGNOSIS — Z046 Encounter for general psychiatric examination, requested by authority: Secondary | ICD-10-CM | POA: Insufficient documentation

## 2017-06-04 DIAGNOSIS — R462 Strange and inexplicable behavior: Secondary | ICD-10-CM | POA: Insufficient documentation

## 2017-06-04 DIAGNOSIS — Z79899 Other long term (current) drug therapy: Secondary | ICD-10-CM | POA: Insufficient documentation

## 2017-06-04 DIAGNOSIS — F333 Major depressive disorder, recurrent, severe with psychotic symptoms: Secondary | ICD-10-CM | POA: Diagnosis present

## 2017-06-04 HISTORY — DX: Schizoaffective disorder, bipolar type: F25.0

## 2017-06-04 HISTORY — DX: Schizoaffective disorder, unspecified: F25.9

## 2017-06-04 LAB — COMPREHENSIVE METABOLIC PANEL
ALBUMIN: 5.7 g/dL — AB (ref 3.5–5.0)
ALT: 14 U/L — ABNORMAL LOW (ref 17–63)
ANION GAP: 11 (ref 5–15)
AST: 26 U/L (ref 15–41)
Alkaline Phosphatase: 44 U/L (ref 38–126)
BUN: 12 mg/dL (ref 6–20)
CO2: 27 mmol/L (ref 22–32)
Calcium: 10.7 mg/dL — ABNORMAL HIGH (ref 8.9–10.3)
Chloride: 103 mmol/L (ref 101–111)
Creatinine, Ser: 0.98 mg/dL (ref 0.61–1.24)
GFR calc Af Amer: >60 mL/min (ref >60)
GLUCOSE: 116 mg/dL — AB (ref 65–99)
POTASSIUM: 3.6 mmol/L (ref 3.5–5.1)
Sodium: 141 mmol/L (ref 135–145)
TOTAL PROTEIN: 9.1 g/dL — AB (ref 6.5–8.1)
Total Bilirubin: 4.2 mg/dL — ABNORMAL HIGH (ref 0.3–1.2)

## 2017-06-04 LAB — ETHANOL

## 2017-06-04 LAB — CBC
HCT: 48.3 % (ref 39.0–52.0)
Hemoglobin: 17.6 g/dL — ABNORMAL HIGH (ref 13.0–17.0)
MCH: 33 pg (ref 26.0–34.0)
MCHC: 36.4 g/dL — ABNORMAL HIGH (ref 30.0–36.0)
MCV: 90.6 fL (ref 78.0–100.0)
PLATELETS: 250 10*3/uL (ref 150–400)
RBC: 5.33 MIL/uL (ref 4.22–5.81)
RDW: 12.8 % (ref 11.5–15.5)
WBC: 7.4 10*3/uL (ref 4.0–10.5)

## 2017-06-04 LAB — SALICYLATE LEVEL: Salicylate Lvl: <7 mg/dL (ref 2.8–30.0)

## 2017-06-04 LAB — ACETAMINOPHEN LEVEL

## 2017-06-04 MED ORDER — HYDROXYZINE HCL 25 MG PO TABS
50.0000 mg | ORAL_TABLET | Freq: Three times a day (TID) | ORAL | Status: DC
  Administered 2017-06-04 – 2017-06-06 (×6): 50 mg via ORAL
  Filled 2017-06-04 (×6): qty 2

## 2017-06-04 MED ORDER — BENZTROPINE MESYLATE 0.5 MG PO TABS: 0.5000 mg | ORAL_TABLET | Freq: Every day | ORAL | Status: DC

## 2017-06-04 MED ORDER — ARIPIPRAZOLE 2 MG PO TABS
2.0000 mg | ORAL_TABLET | Freq: Every day | ORAL | Status: DC
  Filled 2017-06-04 (×2): qty 1

## 2017-06-04 MED ORDER — BENZTROPINE MESYLATE 0.5 MG PO TABS
0.5000 mg | ORAL_TABLET | Freq: Every day | ORAL | Status: DC
  Administered 2017-06-04 – 2017-06-05 (×2): 0.5 mg via ORAL
  Filled 2017-06-04 (×2): qty 1

## 2017-06-04 MED ORDER — CITALOPRAM HYDROBROMIDE 10 MG PO TABS
10.0000 mg | ORAL_TABLET | Freq: Every day | ORAL | Status: DC
  Administered 2017-06-05 – 2017-06-06 (×2): 10 mg via ORAL
  Filled 2017-06-04 (×2): qty 1

## 2017-06-04 NOTE — ED Notes (Signed)
Jacki ConesLaurie NP updated with medications pt has taken, no additional doses at this time.

## 2017-06-04 NOTE — ED Notes (Signed)
Pt awake, alert & responsive, no distress noted, calm & cooperative.  Watching TV at present.  Monitoring for safety, Q 15 min checks in effect.

## 2017-06-04 NOTE — ED Notes (Signed)
Pt ambulatory w/o difficulty to room 34 

## 2017-06-04 NOTE — ED Notes (Signed)
Bed: WA30 Expected date:  Expected time:  Means of arrival:  Comments: Triage 3 

## 2017-06-04 NOTE — ED Triage Notes (Signed)
Per [employer] patient was picked up due to bizarre behaviors. Patient is A&o x4. Main language is [place] but is able to understand and speak English appropriately. Patient states "I am here because I am sick, I don't know what wrong with me". Stares at the wall with blank stare in between answering questions.

## 2017-06-04 NOTE — BH Assessment (Signed)
Assessment Note  Peter Golden is a 36 y.o. male who presented to [facility] under [employer] escort for unspecified ''bizarre behavior.''  Pt is currently voluntary.  Pt was last assessed by TTS in June 2017.  At that time, Pt reported depressive symptoms, as well as "mental pain" and "religious problems."  Pt provided history today.  Per report, Pt has a history of depressive and psychotic symptoms.  Pt was a poor historian, and he appeared somewhat confused.  Pt reported that he came to the hospital today because he was "tired."  When asked how he got to the hospital, Pt stared at author and then at the wall.  Pt reported that he was suicidal with a plan to use a gun.  He stated that he has access to a gun. t reported that he attempted suicide by ''gun'' two months ago.  He would not elaborate on what he meant.  Pt also stated that for the last month, he has experienced sadness and hypersomnia (about 10 hours per night).  When asked if Pt uses substance, he said yes, and then he denied use.  Pt's UDS and BAC were not available at time of assessment.  Pt stared off and was largely non-responsive to questions.  Pt denied current outpatient services.  Per history, he used to receive outpatient services through [facility].  It is unclear when he stopped using [facility].  Pt denied auditory/visual hallucination and self-injurious behavior.  Pt is from [place], [place], and he came to the [place] on a lottery visa in 2014.  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  Pt stated that he lives with friends.  Pt did not respond to questions about the name/contact information of roommates.  In previous assessment, Pt gave the name of his roommate as "Dosse" [phone]).    During assessment, Pt was oriented to place, time, and name.  He was not oriented to situation (saying he came to the hospital because he was tired).  Pt  was in scrubs and appeared appropriately groomed.  Mood was reported as depressed, and affect was flat.  Pt stated that he was suicidal and depressed.  He also reported that he had access to a firearm.  Pt's speech was slow and soft in rate and volume.  Pt's thought processes were slowed, and long pauses and non-responses suggested thought blocking.  Pt's memory and concentration could not be adequately assessed.  Pt's impulse control could not be adequately assessed.  Insight and judgment were poor.  Consulted with Irving Burton, NP.  It is her recommendation that Pt be stabilized overnight, that UDS and BAC be collected, and that Pt have a re-evaluation in the AM.    Diagnosis: 35.2; Major Depressive Disorder, Recurrent, Severe w/o psychotic features; r/o Schizoaffective Disorder  Past Medical History:  Past Medical History:  Diagnosis Date  . Schizo affective schizophrenia (HCC)     History reviewed. No pertinent surgical history.  Family History:  Family History  Problem Relation Age of Onset  . Mental illness Neg Hx     Social History:  reports that  has never smoked. he has never used smokeless tobacco. He reports that he does not drink alcohol or use drugs.  Additional Social History:  Alcohol / Drug Use Pain Medications: See MAR Prescriptions: See MAR Over the Counter: See MAR History of alcohol / drug use?: (Pt denied; UDS/BAC  not available)  CIWA: CIWA-Ar BP: 139/83 Pulse Rate: 100 COWS:    Allergies: No Known Allergies  Home Medications:  (Not in a hospital admission)  OB/GYN Status:  No LMP for male patient.  General Assessment Data [facility] of [facility]: [facility] TTS Assessment: In system Is this a Tele or Face-to-Face Assessment?: Face-to-Face Is this an Initial Assessment or a Re-assessment for this encounter?: Initial Assessment Marital status: Single Is patient pregnant?: No Pregnancy Status: No Living Arrangements: Non-relatives/Friends Can pt return to  current living arrangement?: Yes Admission Status: Voluntary Is patient capable of signing voluntary admission?: Yes Referral Source: Self/Family/Friend Insurance type: Self pay     Crisis Care Plan Living Arrangements: Non-relatives/Friends Name of Psychiatrist: Pt denied Name of Therapist: Pt denied  Education Status Is patient currently in school?: Yes Current Grade: College Highest grade of school patient has completed: [employer] Name of school: [employer]  Risk to self with the past 6 months Suicidal Ideation: Yes-Currently Present Has patient been a risk to self within the past 6 months prior to admission? : (Unknown) Suicidal Intent: No Has patient had any suicidal intent within the past 6 months prior to admission? : No Is patient at risk for suicide?: Yes Suicidal Plan?: No Has patient had any suicidal plan within the past 6 months prior to admission? : No Access to Means: Yes Specify Access to Suicidal Means: Pt said he has a gun What has been your use of drugs/alcohol within the last 12 months?: Pt denied Previous Attempts/Gestures: Yes How many times?: 1(Pt endorsed previous attempt; see notes) Intentional Self Injurious Behavior: None Family Suicide History: Unknown Recent stressful life event(s): Other (Comment)([place]) Persecutory voices/beliefs?: No Depression: Yes Depression Symptoms: Despondent, Insomnia, Isolating Substance abuse history and/or treatment for substance abuse?: No Suicide prevention information given to non-admitted patients: Not applicable  Risk to Others within the past 6 months Homicidal Ideation: No Does patient have any lifetime risk of violence toward others beyond the six months prior to admission? : No Thoughts of Harm to Others: No Current Homicidal Intent: No Current Homicidal Plan: No Access to Homicidal Means: No History of harm to others?: No Assessment of Violence: None Noted Does patient have access to weapons?: No Criminal  Charges Pending?: No Does patient have a court date: No Is patient on probation?: No  Psychosis Hallucinations: None noted Delusions: None noted  Mental Status Report Appearance/Hygiene: In scrubs, Unremarkable Eye Contact: Other (Comment)(Staring) Motor Activity: Unremarkable, Freedom of movement Speech: Slow, Soft, Other (Comment)(paucity of speech ) Level of Consciousness: Alert Mood: Ambivalent Affect: Flat Anxiety Level: None Thought Processes: Thought Blocking Judgement: Impaired Orientation: Person, Place, Time(Not oriented to situation) Obsessive Compulsive Thoughts/Behaviors: None  Cognitive Functioning Concentration: Unable to Assess Memory: Unable to Assess IQ: Average Insight: Poor Impulse Control: Unable to Assess Appetite: Good Sleep: Increased Total Hours of Sleep: 10 Vegetative Symptoms: Unable to Assess  ADLScreening [facility] Assessment Services) Patient's cognitive ability adequate to safely complete daily activities?: Yes Patient able to express need for assistance with ADLs?: Yes Independently performs ADLs?: Yes (appropriate for developmental age)  Prior Inpatient Therapy Prior Inpatient Therapy: Yes Prior Therapy Dates: 2016 [facility] Reason for Treatment: Schizoaffective  Prior Outpatient Therapy Prior Outpatient Therapy: (Unknown) Does patient have an ACCT team?: Unknown Does patient have Intensive In-House Services?  : Unknown Does patient have [facility] services? : Unknown Does patient have P4CC services?: Unknown  ADL Screening (condition at time of admission) Patient's cognitive ability adequate to safely complete daily  activities?: Yes Is the patient deaf or have difficulty hearing?: No Does the patient have difficulty seeing, even when wearing glasses/contacts?: No Does the patient have difficulty concentrating, remembering, or making decisions?: No Patient able to express need for assistance with ADLs?:  Yes Does the patient have difficulty dressing or bathing?: No Independently performs ADLs?: Yes (appropriate for developmental age) Does the patient have difficulty walking or climbing stairs?: No Weakness of Legs: None Weakness of Arms/Hands: None  Home Assistive Devices/Equipment Home Assistive Devices/Equipment: None  Therapy Consults (therapy consults require a physician order) PT Evaluation Needed: No OT Evalulation Needed: No SLP Evaluation Needed: No Abuse/Neglect Assessment (Assessment to be complete while patient is alone) Abuse/Neglect Assessment Can Be Completed: Unable to assess, patient is non-responsive or altered mental status Values / Beliefs Cultural Requests During Hospitalization: None Consults Spiritual Care Consult Needed: No Social Work Consult Needed: No Merchant navy officerAdvance Directives (For Healthcare) Does Patient Have a Medical Advance Directive?: No Would patient like information on creating a medical advance directive?: No - Patient declined    Additional Information 1:1 In Past 12 Months?: No CIRT Risk: No Elopement Risk: No Does patient have medical clearance?: Yes     Disposition:  Disposition Initial Assessment Completed for this Encounter: Yes Disposition of Patient: Re-evaluation by Psychiatry recommended(Start meds, stabilize, AM review)  On Site Evaluation by:   Reviewed with Physician:    Dorris FetchEugene T Staci Carver 06/04/2017 4:38 PM

## 2017-06-04 NOTE — ED Provider Notes (Signed)
[facility]-EMERGENCY DEPT Provider Note   CSN: [id number] Arrival date & time: 06/04/17  1224     History   Chief Complaint Chief Complaint  Patient presents with  . Medical Clearance    HPI Peter Golden is a 36 y.o. male.  HPI   37 year old male with a history of schizoaffective disorder and schizophrenia presents with concern for bizarre behavior.  Patient was brought in by [facility] for "bizarre behavior", however there is no other paperwork or report written of who had called, or what specifically the bizarre behavior was.  History is limited, as patient is not able to say why he came into the emergency department.  He speaks both [place] and [place], and attempted to obtain history using a [place], however patient did not describe more of his symptoms.  Patient just reported "I feel sick" and was staring.  Would not answer further questions.  When asked specific symptoms of illness, such as cough, fever, urinary symptoms, abdominal pain, headaches, vomiting, diarrhea, sore throat or congestion, patient denies all of these.  He denies depression, suicidal ideation, homicidal ideation or hallucinations.  Patient will at some times stare rather than answering questions asked.   Past Medical History:  Diagnosis Date  . Schizo affective schizophrenia [facility])     Patient Active Problem List   Diagnosis Date Noted  . Severe episode of recurrent major depressive disorder, with psychotic features (HCC)   . 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 Hyperlipidemia 07/13/2015    History reviewed. No pertinent surgical history.     Home Medications    Prior to Admission medications   Medication Sig Start Date End Date Taking? Authorizing Provider  ARIPiprazole (ABILIFY) 2 MG tablet Take 1 tablet (2 mg total) by mouth daily. 12/18/15  Yes Adonis Brook, NP  benztropine (COGENTIN) 0.5 MG tablet Take 1 tablet (0.5 mg total) by mouth at bedtime. 12/18/15   Yes Adonis Brook, NP  citalopram (CELEXA) 10 MG tablet Take 1 tablet (10 mg total) by mouth at bedtime. 12/18/15  Yes Adonis Brook, NP  hydrOXYzine (ATARAX/VISTARIL) 50 MG tablet Take 1 tablet (50 mg total) by mouth at bedtime as needed (sleep). 12/18/15  Yes Adonis Brook, NP    Family History Family History  Problem Relation Age of Onset  . Mental illness Neg Hx     Social History Social History   Tobacco Use  . Smoking status: Never Smoker  . Smokeless tobacco: Never Used  Substance Use Topics  . Alcohol use: No    Comment: BAC was not available at time of assessment  . Drug use: No    Comment: UDS not available at time of assessment     Allergies   Patient has no known allergies.   Review of Systems Review of Systems  Constitutional: Negative for fever.  HENT: Negative for sore throat.   Eyes: Negative for visual disturbance.  Respiratory: Negative for shortness of breath.   Cardiovascular: Negative for chest pain.  Gastrointestinal: Negative for abdominal pain, diarrhea, nausea and vomiting.  Genitourinary: Negative for difficulty urinating.  Musculoskeletal: Negative for back pain and neck stiffness.  Skin: Negative for rash.  Neurological: Negative for syncope and headaches.  Psychiatric/Behavioral: Negative for hallucinations, self-injury and suicidal ideas.     Physical Exam Updated Vital Signs BP (!) 148/85 (BP Location: Right Arm)   Pulse 95   Temp 98.6 F (37 C) (Oral)   Resp 16   SpO2 95%   Physical Exam  Constitutional: He is oriented to person, place, and time. He appears well-developed and well-nourished. No distress.  HENT:  Head: Normocephalic and atraumatic.  Eyes: Conjunctivae and EOM are normal.  Neck: Normal range of motion.  Cardiovascular: Normal rate, regular rhythm, normal heart sounds and intact distal pulses. Exam reveals no gallop and no friction rub.  No murmur heard. Pulmonary/Chest: Effort normal and breath sounds  normal. No respiratory distress. He has no wheezes. He has no rales.  Abdominal: Soft. He exhibits no distension. There is no tenderness. There is no guarding.  Musculoskeletal: He exhibits no edema.  Neurological: He is alert and oriented to person, place, and time.  Skin: Skin is warm and dry. He is not diaphoretic.  Psychiatric: His affect is blunt. His speech is delayed. He expresses no homicidal and no suicidal ideation. He expresses no suicidal plans and no homicidal plans.  Nursing note and vitals reviewed.    ED Treatments / Results  Labs (all labs ordered are listed, but only abnormal results are displayed) Labs Reviewed  COMPREHENSIVE METABOLIC PANEL - Abnormal; Notable for the following components:      Result Value   Glucose, Bld 116 (*)    Calcium 10.7 (*)    Total Protein 9.1 (*)    Albumin 5.7 (*)    ALT 14 (*)    Total Bilirubin 4.2 (*)    All other components within normal limits  ACETAMINOPHEN LEVEL - Abnormal; Notable for the following components:   Acetaminophen (Tylenol), Serum <10 (*)    All other components within normal limits  CBC - Abnormal; Notable for the following components:   Hemoglobin 17.6 (*)    MCHC 36.4 (*)    All other components within normal limits  ETHANOL  SALICYLATE LEVEL  RAPID URINE DRUG SCREEN, HOSP PERFORMED    EKG  EKG Interpretation None       Radiology No results found.  Procedures Procedures (including critical care time)  Medications Ordered in ED Medications  ARIPiprazole (ABILIFY) tablet 2 mg (2 mg Oral Not Given 06/04/17 1710)  citalopram (CELEXA) tablet 10 mg (10 mg Oral Not Given 06/04/17 1710)  hydrOXYzine (ATARAX/VISTARIL) tablet 50 mg (50 mg Oral Given 06/04/17 2109)  benztropine (COGENTIN) tablet 0.5 mg (0.5 mg Oral Given 06/04/17 2109)     Initial Impression / Assessment and Plan / ED Course  I have reviewed the triage vital signs and the nursing notes.  Pertinent labs & imaging results that were  available during my care of the patient were reviewed by me and considered in my medical decision making (see chart for details).    36 year old male with a history of schizoaffective disorder and schizophrenia presents with concern for bizarre behavior.  History is limited, as patient stairs, and does not answer several questions.  I was able to complete a full medical review of systems, which was negative.  Patient just reported that he felt "" sick.  History is also limited by history provided when patient arrived to the emergency department.  He does appear to have a blunt affect, and is not communicating normally.  He denies hallucinations, homicidal or suicidal ideation on my evaluation.  Labs obtained, patient is medically cleared.  TTS consulted and evaluated patient.  He is voluntary at this time.  They will reevaluate him in the morning. It appears he did report some suicidal ideation on TTS exam but denied this for me.      Final Clinical Impressions(s) / ED Diagnoses  Final diagnoses:  Bizarre behavior    ED Discharge Orders    None       Alvira MondaySchlossman, Lotoya Casella, MD 06/04/17 2146

## 2017-06-04 NOTE — ED Notes (Signed)
Patient aware that we need urine sample.  Urine cup at bedside.

## 2017-06-05 DIAGNOSIS — F333 Major depressive disorder, recurrent, severe with psychotic symptoms: Secondary | ICD-10-CM

## 2017-06-05 MED ORDER — ARIPIPRAZOLE 5 MG PO TABS
5.0000 mg | ORAL_TABLET | Freq: Every day | ORAL | Status: DC
  Administered 2017-06-05 – 2017-06-06 (×2): 5 mg via ORAL
  Filled 2017-06-05 (×2): qty 1

## 2017-06-05 NOTE — BH Assessment (Signed)
[facility]  Per Juanetta BeetsJacqueline Norman, DO, this pt requires psychiatric hospitalization at this time.  At 16:36 Britta [facility]ccreedyBarbara [facility] to their facility by Dr Tonita PhoenixKomissarova to the [facility] unit, Rm 240-1, with an understanding that pt must arrive either before 23:00 tonight, or after 06:00 tomorrow.  Laveda AbbeLaurie Britton Parks, FNP concurs with this decision.  Pt's nurse, Kendal Hymendie, has been notified, and agrees to call report to [phone].  [facility]  Doylene Canninghomas Roldan [place], [place] Behavioral Health Coordinator [phone]

## 2017-06-05 NOTE — Progress Notes (Signed)
06/05/17 1402:  LRT went to pt room to offer activities, pt was sleep.   Caroll RancherMarjette Jadrian Bulman, LRT/CTRS

## 2017-06-05 NOTE — BH Assessment (Signed)
[facility]  Per Juanetta BeetsJacqueline Norman, DO, this pt requires psychiatric hospitalization at this time.  Dr Sharma CovertNorman also finds that pt meets criteria for IVC, which she has initiated.  [facility], and at 12:41 Peter CashMagistrate Morton confirms receipt.  Findings and Custody Order has since been served.  The following facilities have been contacted to seek placement for this pt, with results as noted:  Beds available, information sent, decision pending[facility]   [facility]  [facility]   Peter Golden, [place] Triage Specialist [phone]

## 2017-06-05 NOTE — Consult Note (Addendum)
[place] Psychiatry Consult   Reason for Consult:  Altered mental status Referring Physician:  EDP Patient Identification: Peter Golden MRN:  [id number] Principal Diagnosis: Severe episode of recurrent major depressive disorder, with psychotic features [facility]) Diagnosis:   Patient Active Problem List   Diagnosis Date Noted  . Severe episode of recurrent major depressive disorder, with psychotic features ([place]) [F33.3]   . Hyperprolactinemia ([place]) [E22.1] 07/14/2015  . Hyperlipidemia [E78.5] 07/13/2015    Total Time spent with patient: 45 minutes  Subjective:   Peter Golden is a 36 y.o. male patient admitted with bizarre behaviors.  HPI:  Peter Golden was brought in by [facility] for bizarre behavior but there is no documentation of what behaviors the patient was displaying. He does not remember where he was last night when he was found by police. He is oriented to self and month but is unable to provide the day and year. He is slowed with responding to questions and does not appear to understand some questions although he is fluent in [place]. He denies mood problems, SI or AVH. He previously endorsed SI to staff and reported access to a gun. He denies problems with sleep. He reports compliance with his medications. 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 He reports doing well academically. He also reports working at [employer] for the past 2 years. He denies problems at work. He lives with a roommate.   Past Psychiatric History: Schizoaffective disorder and MDD, single with psychosis (diagnosed on admission to [facility] in January 2017)  Risk to Self: Suicidal Ideation: Denies  Suicidal Intent: No Is patient at risk for suicide?: Yes Suicidal Plan?: No Access to Means: Yes Specify Access to Suicidal Means: Pt said he has a gun What has been your use of drugs/alcohol within the last 12 months?: Pt denied How many times?: 1(Pt endorsed previous attempt; see notes) Intentional  Self Injurious Behavior: None Risk to Others: Homicidal Ideation: No Thoughts of Harm to Others: No Current Homicidal Intent: No Current Homicidal Plan: No Access to Homicidal Means: No History of harm to others?: No Assessment of Violence: None Noted Does patient have access to weapons?: No Criminal Charges Pending?: No Does patient have a court date: No Prior Inpatient Therapy: Prior Inpatient Therapy: Yes Prior Therapy Dates: 2016 Prior Therapy Facilty/Provider(s): [facility] Reason for Treatment: Schizoaffective Prior Outpatient Therapy: Prior Outpatient Therapy: (Unknown) Does patient have an ACCT team?: Unknown Does patient have Intensive In-House Services?  : Unknown Does patient have [facility] services? : Unknown Does patient have P4CC services?: Unknown  Past Medical History:  Past Medical History:  Diagnosis Date  . Schizo affective schizophrenia ([place])    History reviewed. No pertinent surgical history. Family History:  Family History  Problem Relation Age of Onset  . Mental illness Neg Hx    Family Psychiatric  History: None Social History:  Social History   Substance and Sexual Activity  Alcohol Use No   Comment: BAC was not available at time of assessment     Social History   Substance and Sexual Activity  Drug Use No   Comment: UDS not available at time of assessment    Social History   Socioeconomic History  . Marital status: Single    Spouse name: None  . Number of children: None  . Years of education: None  . Highest education level: None  Social Needs  . Financial resource strain: None  . Food insecurity - worry: None  . Food insecurity -  inability: None  . Transportation needs - medical: None  . Transportation needs - non-medical: None  Occupational History  . None  Tobacco Use  . Smoking status: Never Smoker  . Smokeless tobacco: Never Used  Substance and Sexual Activity  . Alcohol use: No    Comment: BAC was not available at time of  assessment  . Drug use: No    Comment: UDS not available at time of assessment  . Sexual activity: No  Other Topics Concern  . None  Social History Narrative  . None   Additional Social History: He is a Equities trader at [employer]. He majors in [employer]. He has a roommate.     Allergies:  No Known Allergies  Labs:  Results for orders placed or performed during the hospital encounter of 06/04/17 (from the past 48 hour(s))  Comprehensive metabolic panel     Status: Abnormal   Collection Time: 06/04/17  1:51 PM  Result Value Ref Range   Sodium 141 135 - 145 mmol/L   Potassium 3.6 3.5 - 5.1 mmol/L   Chloride 103 101 - 111 mmol/L   CO2 27 22 - 32 mmol/L   Glucose, Bld 116 (H) 65 - 99 mg/dL   BUN 12 6 - 20 mg/dL   Creatinine, Ser 0.98 0.61 - 1.24 mg/dL   Calcium 10.7 (H) 8.9 - 10.3 mg/dL   Total Protein 9.1 (H) 6.5 - 8.1 g/dL   Albumin 5.7 (H) 3.5 - 5.0 g/dL   AST 26 15 - 41 U/L   ALT 14 (L) 17 - 63 U/L   Alkaline Phosphatase 44 38 - 126 U/L   Total Bilirubin 4.2 (H) 0.3 - 1.2 mg/dL   GFR calc non Af Amer >60 >60 mL/min   GFR calc Af Amer >60 >60 mL/min    Comment: (NOTE) The eGFR has been calculated using the CKD EPI equation. This calculation has not been validated in all clinical situations. eGFR's persistently <60 mL/min signify possible Chronic Kidney Disease.    Anion gap 11 5 - 15  Ethanol     Status: None   Collection Time: 06/04/17  1:51 PM  Result Value Ref Range   Alcohol, Ethyl (B) <10 <10 mg/dL    Comment:        LOWEST DETECTABLE LIMIT FOR SERUM ALCOHOL IS 10 mg/dL FOR MEDICAL PURPOSES ONLY   Salicylate level     Status: None   Collection Time: 06/04/17  1:51 PM  Result Value Ref Range   Salicylate Lvl <9.4 2.8 - 30.0 mg/dL  Acetaminophen level     Status: Abnormal   Collection Time: 06/04/17  1:51 PM  Result Value Ref Range   Acetaminophen (Tylenol), Serum <10 (L) 10 - 30 ug/mL    Comment:        THERAPEUTIC CONCENTRATIONS VARY SIGNIFICANTLY. A RANGE OF  10-30 ug/mL MAY BE AN EFFECTIVE CONCENTRATION FOR MANY PATIENTS. HOWEVER, SOME ARE BEST TREATED AT CONCENTRATIONS OUTSIDE THIS RANGE. ACETAMINOPHEN CONCENTRATIONS >150 ug/mL AT 4 HOURS AFTER INGESTION AND >50 ug/mL AT 12 HOURS AFTER INGESTION ARE OFTEN ASSOCIATED WITH TOXIC REACTIONS.   cbc     Status: Abnormal   Collection Time: 06/04/17  1:51 PM  Result Value Ref Range   WBC 7.4 4.0 - 10.5 K/uL   RBC 5.33 4.22 - 5.81 MIL/uL   Hemoglobin 17.6 (H) 13.0 - 17.0 g/dL   HCT 48.3 39.0 - 52.0 %   MCV 90.6 78.0 - 100.0 fL   MCH 33.0 26.0 -  34.0 pg   MCHC 36.4 (H) 30.0 - 36.0 g/dL   RDW 12.8 11.5 - 15.5 %   Platelets 250 150 - 400 K/uL    Current Facility-Administered Medications  Medication Dose Route Frequency Provider Last Rate Last Dose  . ARIPiprazole (ABILIFY) tablet 2 mg  2 mg Oral Daily Ethelene Hal, NP      . benztropine (COGENTIN) tablet 0.5 mg  0.5 mg Oral QHS Gareth Morgan, MD   0.5 mg at 06/04/17 2109  . citalopram (CELEXA) tablet 10 mg  10 mg Oral Daily Ethelene Hal, NP      . hydrOXYzine (ATARAX/VISTARIL) tablet 50 mg  50 mg Oral TID Ethelene Hal, NP   50 mg at 06/04/17 2109   Current Outpatient Medications  Medication Sig Dispense Refill  . ARIPiprazole (ABILIFY) 2 MG tablet Take 1 tablet (2 mg total) by mouth daily. 30 tablet 0  . benztropine (COGENTIN) 0.5 MG tablet Take 1 tablet (0.5 mg total) by mouth at bedtime. 30 tablet 0  . citalopram (CELEXA) 10 MG tablet Take 1 tablet (10 mg total) by mouth at bedtime. 30 tablet 0  . hydrOXYzine (ATARAX/VISTARIL) 50 MG tablet Take 1 tablet (50 mg total) by mouth at bedtime as needed (sleep). 30 tablet 0    Musculoskeletal: Strength & Muscle Tone: within normal limits Gait & Station: Unable to assess since patient is lying in bed. Patient leans: N/A  Psychiatric Specialty Exam: Physical Exam  Nursing note and vitals reviewed. Constitutional: He appears well-developed and well-nourished.   HENT:  Head: Normocephalic and atraumatic.  Neck: Normal range of motion.  Respiratory: Effort normal.  Musculoskeletal: Normal range of motion.  Neurological: He is alert.  Oriented to person and month.   Skin: No rash noted.  Psychiatric: His affect is blunt. His speech is delayed. He is slowed. Cognition and memory are impaired. He expresses no suicidal plans.    Review of Systems  Constitutional: Negative for chills and fever.  Gastrointestinal: Negative for abdominal pain, diarrhea, nausea and vomiting.  Psychiatric/Behavioral: Negative for depression, hallucinations, substance abuse and suicidal ideas.    Blood pressure (!) 148/85, pulse 95, temperature 98.6 F (37 C), temperature source Oral, resp. rate 16, SpO2 95 %.There is no height or weight on file to calculate BMI.  General Appearance: Well Groomed, young, African male with blood shot eyes and wearing paper hospital scrubs. NAD.    Eye Contact:  Good  Speech:  Slow  Volume:  Decreased  Mood:  "Okay"  Affect:  Flat  Thought Process:  Linear  Orientation:  Other:  Person and month only.  Thought Content:  Logical  Suicidal Thoughts:  No  Homicidal Thoughts:  No  Memory:  Immediate;   Poor Recent;   Poor Remote;   Poor  Judgement:  Impaired  Insight:  Lacking  Psychomotor Activity:  Decreased  Concentration:  Concentration: Fair and Attention Span: Fair  Recall:  Poor  Fund of Knowledge:  Poor  Language:  Fair  Akathisia:  No  Handed:  Right  AIMS (if indicated):   N/A  Assets:  Housing  ADL's:  Intact  Cognition:  Impaired due to altered mental status.  Sleep:   Okay   Assessment: Peter Golden is a 36 y.o. male who was admitted with bizarre behavior by police. He is delayed in speech and thought process today. He does not recall why he is in the hospital and does not appear to comprehend the questions he  is asked at times. He does not appear to be responding to internal stimuli. He warrants  inpatient psychiatric hospitalization for stabilization as it is suspected that he may be presenting with a thought disorder. He has a history of schizoaffective disorder although he was diagnosed with MDD with psychosis at his last hospitalization at [facility].   Treatment Plan Summary: -Continue home medications: Celexa 10 mg daily for depression, Cogentin 0.5 mg qhs and Atarax 50 mg TID. Increase Abilify to 5 mg daily. -Obtain collateral from patient's roommate.  -Will IVC patient since he does not have capacity to consent to admission.    Disposition: Recommend psychiatric Inpatient admission when medically cleared.  Faythe Dingwall, DO 06/05/2017 10:20 AM

## 2017-06-05 NOTE — ED Notes (Signed)
Pt is withdrawn and very soft spoken.  Pt was encouraged to supply a urine sample.  Pt verbalized understanding.  Pt denies S/I, H/I, and AVH.  15 minute checks and video monitoring in place.

## 2017-06-05 NOTE — ED Notes (Signed)
I have attempted multiple times to get patient to give urine.  Pt is unwilling to provide urine or bathe.

## 2017-06-06 NOTE — ED Notes (Signed)
Report to RN Hurshel PartyJane Golden, [facility].

## 2017-06-06 NOTE — ED Notes (Signed)
Pt awake, alert & responsive, no distress noted, calm & cooperative.  Sleeping at present.  Monitoring for safety, Q 15 min checks in effect.  [facility] and [facility].

## 2017-07-03 ENCOUNTER — Emergency Department (HOSPITAL_COMMUNITY)
Admission: EM | Admit: 2017-07-03 | Discharge: 2017-07-04 | Disposition: A | Discharge status: Other Acute Inpatient Hospital | Payer: Self-pay | Attending: Emergency Medicine | Admitting: Emergency Medicine

## 2017-07-03 ENCOUNTER — Encounter (HOSPITAL_COMMUNITY): Payer: Self-pay | Admitting: Emergency Medicine

## 2017-07-03 DIAGNOSIS — F333 Major depressive disorder, recurrent, severe with psychotic symptoms: Secondary | ICD-10-CM | POA: Insufficient documentation

## 2017-07-03 DIAGNOSIS — Z79899 Other long term (current) drug therapy: Secondary | ICD-10-CM | POA: Insufficient documentation

## 2017-07-03 DIAGNOSIS — F209 Schizophrenia, unspecified: Secondary | ICD-10-CM

## 2017-07-03 DIAGNOSIS — R17 Unspecified jaundice: Secondary | ICD-10-CM

## 2017-07-03 LAB — CBC WITH DIFFERENTIAL/PLATELET
BASOS ABS: 0 10*3/uL (ref 0.0–0.1)
Basophils Relative: 0 %
Eosinophils Absolute: 0 10*3/uL (ref 0.0–0.7)
Eosinophils Relative: 0 %
HCT: 46.9 % (ref 39.0–52.0)
Hemoglobin: 17.1 g/dL — ABNORMAL HIGH (ref 13.0–17.0)
LYMPHS PCT: 33 %
Lymphs Abs: 2.1 10*3/uL (ref 0.7–4.0)
MCH: 33.6 pg (ref 26.0–34.0)
MCHC: 36.5 g/dL — ABNORMAL HIGH (ref 30.0–36.0)
MCV: 92.1 fL (ref 78.0–100.0)
Monocytes Absolute: 0.6 10*3/uL (ref 0.1–1.0)
Monocytes Relative: 9 %
NEUTROS ABS: 3.7 10*3/uL (ref 1.7–7.7)
NEUTROS PCT: 58 %
PLATELETS: 183 10*3/uL (ref 150–400)
RBC: 5.09 MIL/uL (ref 4.22–5.81)
RDW: 13.1 % (ref 11.5–15.5)
WBC: 6.3 10*3/uL (ref 4.0–10.5)

## 2017-07-03 LAB — COMPREHENSIVE METABOLIC PANEL
ALBUMIN: 5.3 g/dL — AB (ref 3.5–5.0)
ALK PHOS: 47 U/L (ref 38–126)
ALT: 14 U/L — AB (ref 17–63)
AST: 24 U/L (ref 15–41)
Anion gap: 13 (ref 5–15)
BUN: 10 mg/dL (ref 6–20)
CALCIUM: 10.3 mg/dL (ref 8.9–10.3)
CO2: 25 mmol/L (ref 22–32)
CREATININE: 0.9 mg/dL (ref 0.61–1.24)
Chloride: 101 mmol/L (ref 101–111)
GFR calc Af Amer: >60 mL/min (ref >60)
GFR calc non Af Amer: >60 mL/min (ref >60)
GLUCOSE: 104 mg/dL — AB (ref 65–99)
Potassium: 3.4 mmol/L — ABNORMAL LOW (ref 3.5–5.1)
Sodium: 139 mmol/L (ref 135–145)
Total Bilirubin: 3.5 mg/dL — ABNORMAL HIGH (ref 0.3–1.2)
Total Protein: 9 g/dL — ABNORMAL HIGH (ref 6.5–8.1)

## 2017-07-03 LAB — RAPID URINE DRUG SCREEN, HOSP PERFORMED
Amphetamines: NOT DETECTED
BARBITURATES: NOT DETECTED
BENZODIAZEPINES: NOT DETECTED
Cocaine: NOT DETECTED
Opiates: NOT DETECTED
Tetrahydrocannabinol: NOT DETECTED

## 2017-07-03 LAB — ETHANOL: Alcohol, Ethyl (B): <10 mg/dL (ref <10)

## 2017-07-03 MED ORDER — ZOLPIDEM TARTRATE 5 MG PO TABS
5.0000 mg | ORAL_TABLET | Freq: Every day | ORAL | Status: DC
  Filled 2017-07-03: qty 1

## 2017-07-03 MED ORDER — POTASSIUM CHLORIDE CRYS ER 20 MEQ PO TBCR
40.0000 meq | EXTENDED_RELEASE_TABLET | Freq: Once | ORAL | Status: AC
  Administered 2017-07-03: 40 meq via ORAL
  Filled 2017-07-03: qty 2

## 2017-07-03 MED ORDER — ZIPRASIDONE MESYLATE 20 MG IM SOLR
20.0000 mg | Freq: Once | INTRAMUSCULAR | Status: AC
  Administered 2017-07-03: 20 mg via INTRAMUSCULAR
  Filled 2017-07-03: qty 20

## 2017-07-03 MED ORDER — HYDROXYZINE HCL 25 MG PO TABS
50.0000 mg | ORAL_TABLET | Freq: Every evening | ORAL | Status: DC | PRN
Start: 2017-07-03 — End: 2017-07-04

## 2017-07-03 MED ORDER — STERILE WATER FOR INJECTION IJ SOLN
INTRAMUSCULAR | Status: AC
  Administered 2017-07-03: 1.2 mL
  Filled 2017-07-03: qty 10

## 2017-07-03 MED ORDER — HALOPERIDOL 5 MG PO TABS
5.0000 mg | ORAL_TABLET | Freq: Every day | ORAL | Status: DC
  Filled 2017-07-03: qty 1

## 2017-07-03 MED ORDER — ARIPIPRAZOLE 2 MG PO TABS
2.0000 mg | ORAL_TABLET | Freq: Every day | ORAL | Status: DC
  Administered 2017-07-04: 2 mg via ORAL
  Filled 2017-07-03: qty 1

## 2017-07-03 MED ORDER — CITALOPRAM HYDROBROMIDE 10 MG PO TABS
20.0000 mg | ORAL_TABLET | Freq: Every day | ORAL | Status: DC
  Filled 2017-07-03: qty 2

## 2017-07-03 MED ORDER — BENZTROPINE MESYLATE 0.5 MG PO TABS
0.5000 mg | ORAL_TABLET | Freq: Every day | ORAL | Status: DC
  Filled 2017-07-03: qty 1

## 2017-07-03 MED ORDER — CITALOPRAM HYDROBROMIDE 10 MG PO TABS: 10.0000 mg | ORAL_TABLET | Freq: Every day | ORAL | Status: DC
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
## 2017-07-03 NOTE — ED Notes (Signed)
Patient sitting on floor in front of nurses station. Patient appears to be responding to internal and external stimuli, Patient has fixed stare of his face. Safety maintain and Q 15 min safety checks remain in place.

## 2017-07-03 NOTE — ED Notes (Signed)
Pt changed into scrubs and belongings placed in locker.

## 2017-07-03 NOTE — ED Notes (Signed)
Contacted pharmacy about verification of medication.  Pharmacy is waiting for history of medication to be completed after they speak with a family member.

## 2017-07-03 NOTE — ED Notes (Signed)
Patient arrived to Acute Care area, pacing in the hall slowly and not responding to questions.  Patient was shown his room but is not interested in going into the room.  Patient stares with a blank look on his face.  Gave patient a glass of water and he drank it in the hallway.

## 2017-07-03 NOTE — ED Notes (Signed)
Pt family can be contacted for any question, comments, or concerns:  [name]: [phone]  Doseh [phone]
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
## 2017-07-03 NOTE — ED Notes (Signed)
Patient has stripped out of his scrubs down to his boxers and shorts in hallway in front of nurses . Patient is not redirectable at this time. Patient has blank stared on his face and appears to be responding to internal stimuli. Patient walk down opposite side of hall from his room and sat down floor and refuses to move. Security called and Dr. Estell HarpinZammit. New orders received for Geodon 20 mg IM x 1. Orders read back and verified.

## 2017-07-04 ENCOUNTER — Encounter (HOSPITAL_COMMUNITY): Payer: Self-pay | Admitting: *Deleted

## 2017-07-04 ENCOUNTER — Other Ambulatory Visit: Payer: Self-pay

## 2017-07-04 ENCOUNTER — Inpatient Hospital Stay (HOSPITAL_COMMUNITY)
Admission: AD | Admit: 2017-07-04 | Discharge: 2017-07-09 | DRG: 885 | Disposition: A | Discharge status: Home / Self Care | Payer: Federal, State, Local not specified - Other | Source: Intra-hospital | Attending: Psychiatry | Admitting: Psychiatry

## 2017-07-04 DIAGNOSIS — F419 Anxiety disorder, unspecified: Secondary | ICD-10-CM | POA: Diagnosis present

## 2017-07-04 DIAGNOSIS — F333 Major depressive disorder, recurrent, severe with psychotic symptoms: Secondary | ICD-10-CM | POA: Diagnosis present

## 2017-07-04 DIAGNOSIS — Z79899 Other long term (current) drug therapy: Secondary | ICD-10-CM

## 2017-07-04 DIAGNOSIS — F209 Schizophrenia, unspecified: Secondary | ICD-10-CM | POA: Diagnosis not present

## 2017-07-04 DIAGNOSIS — F200 Paranoid schizophrenia: Secondary | ICD-10-CM | POA: Diagnosis not present

## 2017-07-04 DIAGNOSIS — G4700 Insomnia, unspecified: Secondary | ICD-10-CM | POA: Diagnosis not present

## 2017-07-04 DIAGNOSIS — R440 Auditory hallucinations: Secondary | ICD-10-CM | POA: Diagnosis not present

## 2017-07-04 DIAGNOSIS — F29 Unspecified psychosis not due to a substance or known physiological condition: Secondary | ICD-10-CM

## 2017-07-04 DIAGNOSIS — R4587 Impulsiveness: Secondary | ICD-10-CM

## 2017-07-04 DIAGNOSIS — Z560 Unemployment, unspecified: Secondary | ICD-10-CM | POA: Diagnosis not present

## 2017-07-04 DIAGNOSIS — F329 Major depressive disorder, single episode, unspecified: Secondary | ICD-10-CM | POA: Diagnosis not present

## 2017-07-04 DIAGNOSIS — F39 Unspecified mood [affective] disorder: Secondary | ICD-10-CM | POA: Diagnosis not present

## 2017-07-04 DIAGNOSIS — R450 Nervousness: Secondary | ICD-10-CM | POA: Diagnosis not present

## 2017-07-04 DIAGNOSIS — R451 Restlessness and agitation: Secondary | ICD-10-CM

## 2017-07-04 MED ORDER — MAGNESIUM HYDROXIDE 400 MG/5ML PO SUSP: 30.0000 mL | Freq: Every day | ORAL | Status: DC | PRN

## 2017-07-04 MED ORDER — ACETAMINOPHEN 325 MG PO TABS: 650.0000 mg | ORAL_TABLET | Freq: Four times a day (QID) | ORAL | Status: DC | PRN

## 2017-07-04 MED ORDER — DIPHENHYDRAMINE HCL 50 MG/ML IJ SOLN
25.0000 mg | Freq: Once | INTRAMUSCULAR | Status: AC
  Administered 2017-07-04: 25 mg via INTRAVENOUS

## 2017-07-04 MED ORDER — HALOPERIDOL LACTATE 5 MG/ML IJ SOLN
5.0000 mg | Freq: Once | INTRAMUSCULAR | Status: AC
  Administered 2017-07-04: 5 mg via INTRAMUSCULAR
  Filled 2017-07-04: qty 1

## 2017-07-04 MED ORDER — ZOLPIDEM TARTRATE 5 MG PO TABS
5.0000 mg | ORAL_TABLET | Freq: Every day | ORAL | Status: DC
  Administered 2017-07-04: 5 mg via ORAL
  Filled 2017-07-04: qty 1

## 2017-07-04 MED ORDER — LORAZEPAM 2 MG/ML IJ SOLN
1.0000 mg | Freq: Once | INTRAMUSCULAR | Status: AC
  Administered 2017-07-04: 1 mg via INTRAVENOUS

## 2017-07-04 MED ORDER — DIPHENHYDRAMINE HCL 50 MG/ML IJ SOLN
INTRAMUSCULAR | Status: AC
  Filled 2017-07-04: qty 1

## 2017-07-04 MED ORDER — HALOPERIDOL LACTATE 5 MG/ML IJ SOLN
INTRAMUSCULAR | Status: AC
  Filled 2017-07-04: qty 1

## 2017-07-04 MED ORDER — ALUM & MAG HYDROXIDE-SIMETH 200-200-20 MG/5ML PO SUSP: 30.0000 mL | ORAL | Status: DC | PRN

## 2017-07-04 MED ORDER — HALOPERIDOL 5 MG PO TABS
5.0000 mg | ORAL_TABLET | Freq: Every day | ORAL | Status: DC
  Administered 2017-07-04: 5 mg via ORAL
  Filled 2017-07-04 (×4): qty 1

## 2017-07-04 MED ORDER — HYDROXYZINE HCL 50 MG PO TABS: 50.0000 mg | ORAL_TABLET | Freq: Every evening | ORAL | Status: DC | PRN

## 2017-07-04 MED ORDER — CITALOPRAM HYDROBROMIDE 20 MG PO TABS
20.0000 mg | ORAL_TABLET | Freq: Every day | ORAL | Status: DC
  Administered 2017-07-04: 20 mg via ORAL
  Filled 2017-07-04 (×4): qty 1

## 2017-07-04 MED ORDER — BENZTROPINE MESYLATE 0.5 MG PO TABS
0.5000 mg | ORAL_TABLET | Freq: Every day | ORAL | Status: DC
  Administered 2017-07-04 – 2017-07-08 (×5): 0.5 mg via ORAL
  Filled 2017-07-04: qty 7
  Filled 2017-07-04 (×8): qty 1

## 2017-07-04 MED ORDER — LORAZEPAM 2 MG/ML IJ SOLN
INTRAMUSCULAR | Status: AC
  Filled 2017-07-04: qty 1

## 2017-07-04 NOTE — ED Notes (Signed)
Pt compliant with morning medication regimen. Pleasant on approach, smiling at times. Pt noted laughing and talking to self in room. Encourgement and support provided. Special checks q 15 mins in place for safety, Video monitoring in place. Will continue to monitor.
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
## 2017-07-04 NOTE — Progress Notes (Signed)
1:1 note;  Pt in room asleep.  Pt continuously  observed 1:1 for patient safety.  Pt remains safe on unit.

## 2017-07-04 NOTE — ED Notes (Signed)
[employer] on [facility] per MD order. Personal property given to [employer] for transfer. Pt ambulatory off unit in police custody.

## 2017-07-04 NOTE — Progress Notes (Signed)
Pt in room asleep.  Pt continues to be observed 1:1 for pt safety.  Pt remains safe on unit.

## 2017-07-04 NOTE — Tx Team (Signed)
Initial Treatment Plan 07/04/2017 4:45 PM [name] XBJ:[id number]    PATIENT STRESSORS: Financial difficulties Medication change or noncompliance   PATIENT STRENGTHS: General fund of knowledge Physical Health   PATIENT IDENTIFIED PROBLEMS: Psychosis/paranoia  "I want help getting a job"                   DISCHARGE CRITERIA:  Improved stabilization in mood, thinking, and/or behavior Verbal commitment to aftercare and medication compliance  PRELIMINARY DISCHARGE PLAN: Outpatient therapy Medication management  PATIENT/FAMILY INVOLVEMENT: This treatment plan has been presented to and reviewed with the patient, Peter Golden.  The patient and family have been given the opportunity to ask questions and make suggestions.  Levin BaconHeather V Tyshan Enderle, RN 07/04/2017, 4:45 PM
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
## 2017-07-04 NOTE — Progress Notes (Signed)
1:1 note;  Pt in room asleep.  Pt continuously  observed 1:1 for patient safety.  Pt remains safe on unit. 

## 2017-07-04 NOTE — Progress Notes (Signed)
Pt out of bed to use bathroom and brush teeth.  Pt complies with med request.  Pt does not speak but nods his heard in understanding
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
## 2017-07-05 DIAGNOSIS — R450 Nervousness: Secondary | ICD-10-CM

## 2017-07-05 DIAGNOSIS — Z560 Unemployment, unspecified: Secondary | ICD-10-CM

## 2017-07-05 DIAGNOSIS — F419 Anxiety disorder, unspecified: Secondary | ICD-10-CM

## 2017-07-05 DIAGNOSIS — G4700 Insomnia, unspecified: Secondary | ICD-10-CM

## 2017-07-05 DIAGNOSIS — F200 Paranoid schizophrenia: Secondary | ICD-10-CM

## 2017-07-05 MED ORDER — DIPHENHYDRAMINE HCL 50 MG/ML IJ SOLN: 25.0000 mg | Freq: Four times a day (QID) | INTRAMUSCULAR | Status: DC | PRN

## 2017-07-05 MED ORDER — HALOPERIDOL 5 MG PO TABS
10.0000 mg | ORAL_TABLET | Freq: Every day | ORAL | Status: DC
  Administered 2017-07-05 – 2017-07-08 (×4): 10 mg via ORAL
  Filled 2017-07-05: qty 2
  Filled 2017-07-05: qty 14
  Filled 2017-07-05 (×5): qty 2

## 2017-07-05 MED ORDER — HYDROXYZINE HCL 25 MG PO TABS
25.0000 mg | ORAL_TABLET | Freq: Four times a day (QID) | ORAL | Status: DC | PRN
  Filled 2017-07-05: qty 10

## 2017-07-05 MED ORDER — MIRTAZAPINE 15 MG PO TABS
15.0000 mg | ORAL_TABLET | Freq: Every day | ORAL | Status: DC
  Administered 2017-07-05 – 2017-07-08 (×4): 15 mg via ORAL
  Filled 2017-07-05 (×2): qty 1
  Filled 2017-07-05: qty 7
  Filled 2017-07-05 (×4): qty 1

## 2017-07-05 MED ORDER — LORAZEPAM 2 MG/ML IJ SOLN: 1.0000 mg | Freq: Four times a day (QID) | INTRAMUSCULAR | Status: DC | PRN

## 2017-07-05 MED ORDER — HALOPERIDOL LACTATE 5 MG/ML IJ SOLN: 5.0000 mg | Freq: Four times a day (QID) | INTRAMUSCULAR | Status: DC | PRN

## 2017-07-05 NOTE — Progress Notes (Signed)
Patient ID: Peter Golden, male   DOB: 05/30/1981, 37 y.o.   MRN: [id number]    1:1 Nursing Note   D:Pt in room resting, remains very paranoid and suspicious of others. Pt did not eat dinner and is refusing to drink. Pt will not respond to staff attempting to talk to him. A: 1:1 continued for patient safety. R:Pts safety maintained.

## 2017-07-05 NOTE — Progress Notes (Signed)
Pt continues 1:1 continuous observation.  Pt in bed sleeping.  Pt remains safe on unit.

## 2017-07-05 NOTE — Progress Notes (Signed)
Patient ID: Donnalee CurryGbedey Kokou Alverio, male   DOB: May 17, 1981, 37 y.o.   MRN: [id number]   1:1 Nursing Note   D: Pt in room resting, remains very paranoid and suspicious of others. Pt did not eat lunch and is refusing to drink. Pt will no respond to staff attempting to talk to him. Pt also refused to fill out his pt self inventory sheet. A: 1:1 continued for patient safety. R:Pts safety maintained.

## 2017-07-05 NOTE — Progress Notes (Signed)
Pt continues 1:1 continuous observation.  Pt in bed sleeping.  Pt remains safe on unit. 

## 2017-07-05 NOTE — [facility] Group Notes (Signed)
[facility] Group Notes: (Clinical Social Work)   07/05/2017      Type of Therapy:  Group Therapy   Participation Level:  Did Not Attend despite MHT prompting   Betzabeth Derringer Grossman-Orr, LCSW 07/05/2017, 11:52 AM     

## 2017-07-05 NOTE — Progress Notes (Signed)
Patient ID: Peter Golden, male   DOB: 1981/06/12, 37 y.o.   MRN: [id number]   1:1 Nursing Note   D: Pt in room being assessed by NP Aggie, pt appeared to be very guarded and was not participating in assessment. Pt remained very paranoid and suspicious of people. A: 1:1 continued for patient safety. R:Pts safety maintained.

## 2017-07-05 NOTE — Progress Notes (Signed)
Pt remains on 1:1 continuous observation.  Pt is in bed sleeping.  Pt remains safe on the unit
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
## 2017-07-05 NOTE — Progress Notes (Signed)
Patient ID: Peter Golden, male   DOB: 24-Oct-1980, 37 y.o.   MRN: [id number]  1:1 Note  Pt observed resting in bed with eyes closed. Pt appears to be anxious, paranoid and suspicious of others however denied AVH. Pt also denied SI, HI, Pain depression. Pt does not look to be in any distress at this time. All Pt's questions and concerns addressed. Support, encouragement, and safe environment provided.1:1 staff is present in room with Pt at this time. 1:1 monitoring continues for Pt's safety. 15-minute safety checks also continues at this time. Pt was med compliant.

## 2017-07-05 NOTE — Progress Notes (Signed)
Pt remains on 1:1 continuous observation.  Pt is in bed sleeping.  Pt remains safe on unit 

## 2017-07-05 NOTE — H&P (Signed)
Psychiatric Admission Assessment Adult  Patient Identification: Peter Golden  MRN:  [id number]  Date of Evaluation:  07/05/2017  Chief Complaint: Erratic behavior  Principal Diagnosis: Major depressive disorder, recurrent episode, severe ([place])  Diagnosis:   Patient Active Problem List   Diagnosis Date Noted  . Major depressive disorder, recurrent episode, severe, with psychosis ([place]) [F33.3] 07/04/2017  . Severe episode of recurrent major depressive disorder, with psychotic features ([place]) [F33.3]   . 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 Hyperlipidemia [E78.5] 07/13/2015  . Psychosis ([place]) [F29] 07/12/2015   History of Present Illness: This is one of several admission assessment for this 37 year old AA male from the [place] region of [place]. He is known on this unit from previous hospitalizations x 2 in 2017. Chart review indicated he was hospitalized at the [facility] sometime last year, 2018. All the hospitalizations were related to mood instability due to  worsening depression. Peter Golden does have hx of depression with psychotic features. Usually during his previous admissions, Peter Golden usually will provide some useful information about his condition. This time around, he barely provided any useful information about his situation.  During this assessment. Peter Golden reports, "I can't remember why I was taken to the hospital. I don't have any job. They fired me from my company. I work in a Soil scientist. I can't remember why I was fired from my job. I was taking my medicines, I just don't know what they are".  Objective: Peter Golden is seen, chart reviewed. He is alert, oriented x 3 & unaware of situation. He is in a bugundy hospital scrub. He is verbally responsive, however, speaks very softly. He makes fair eye contact.  He denies any drug use, alcohol consumption or cigarette smoking. He says he receives his mental health care at [facility].  Associated Signs/Symptoms:  Depression  Symptoms:  depressed mood, insomnia, hopelessness, anxiety, loss of energy/fatigue, decreased appetite,  (Hypo) Manic Symptoms:  Labiality of Mood,  Anxiety Symptoms:  Excessive Worry,  Psychotic Symptoms:  Paranoia,  PTSD Symptoms: Negative  Total Time spent with patient: 1 hour  Past Psychiatric History: Schizoaffective disorder.  Risk to Self: Is patient at risk for suicide?: No   Risk to Others: Denies    Prior Inpatient Therapy: Yes, ([place] x 2 previous hospitalizations, chart reviewed showed whee he was hospitalized [facility].)  Prior Outpatient Therapy: Yes.  Alcohol Screening: 1. How often do you have a drink containing alcohol?: Never 2. How many drinks containing alcohol do you have on a typical day when you are drinking?: 1 or 2 3. How often do you have six or more drinks on one occasion?: Never AUDIT-C Score: 0 9. Have you or someone else been injured as a result of your drinking?: No 10. Has a relative or friend or a doctor or another health worker been concerned about your drinking or suggested you cut down?: No Alcohol Use Disorder Identification Test Final Score (AUDIT): 0 Intervention/Follow-up: AUDIT Score <7 follow-up not indicated  Substance Abuse History in the last 12 months:  No., denies any drug or alcohol  Consequences of Substance Abuse: Negative  Previous Psychotropic Medications: Yes, (Abilify)   Psychological Evaluations: No   Past Medical History: Denies any hx of medical illness.  Family History: Denies hx of HTN, DM, cardiac disease or thyroid disease in the family. Family History  Problem Relation Age of Onset  . Mental illness Neg Hx    Family Psychiatric  History: Denies hx of mental illness, drug  abuse or suicide in the family.  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 Pt denies any legal issues. . Social History    Substance and Sexual Activity  Alcohol Use No   Comment: BAC was not available at time of assessment     Social History   Substance and Sexual Activity  Drug Use No   Comment: UDS not available at time of assessment    Social History   Socioeconomic History  . Marital status: Single    Spouse name: None  . Number of children: None  . Years of education: None  . Highest education level: None  Social Needs  . Financial resource strain: None  . Food insecurity - worry: None  . Food insecurity - inability: None  . Transportation needs - medical: None  . Transportation needs - non-medical: None  Occupational History  . None  Tobacco Use  . Smoking status: Never Smoker  . Smokeless tobacco: Never Used  Substance and Sexual Activity  . Alcohol use: No    Comment: BAC was not available at time of assessment  . Drug use: No    Comment: UDS not available at time of assessment  . Sexual activity: No  Other Topics Concern  . None  Social History Narrative  . None   Additional Social History: Currently unemployed.  Allergies:  No Known Allergies  Lab Results:  Results for orders placed or performed during the hospital encounter of 07/03/17 (from the past 48 hour(s))  Urine rapid drug screen (hosp performed)     Status: None   Collection Time: 07/03/17  3:05 PM  Result Value Ref Range   Opiates NONE DETECTED NONE DETECTED   Cocaine NONE DETECTED NONE DETECTED   Benzodiazepines NONE DETECTED NONE DETECTED   Amphetamines NONE DETECTED NONE DETECTED   Tetrahydrocannabinol NONE DETECTED NONE DETECTED   Barbiturates NONE DETECTED NONE DETECTED    Comment: (NOTE) DRUG SCREEN FOR MEDICAL PURPOSES ONLY.  IF CONFIRMATION IS NEEDED FOR ANY PURPOSE, NOTIFY LAB WITHIN 5 DAYS. LOWEST DETECTABLE LIMITS FOR URINE DRUG SCREEN Drug Class                     Cutoff (ng/mL) Amphetamine and metabolites    1000 Barbiturate and metabolites    200 Benzodiazepine                  161 Tricyclics and metabolites     300 Opiates and metabolites        300 Cocaine and metabolites        300 THC                            50   Comprehensive metabolic panel     Status: Abnormal   Collection Time: 07/03/17  4:04 PM  Result Value Ref Range   Sodium 139 135 - 145 mmol/L   Potassium 3.4 (L) 3.5 - 5.1 mmol/L   Chloride 101 101 - 111 mmol/L   CO2 25 22 - 32 mmol/L   Glucose, Bld 104 (H) 65 - 99 mg/dL   BUN 10 6 - 20 mg/dL   Creatinine, Ser 0.90 0.61 - 1.24 mg/dL   Calcium 10.3 8.9 - 10.3 mg/dL   Total Protein 9.0 (H) 6.5 - 8.1 g/dL   Albumin 5.3 (H) 3.5 - 5.0  g/dL   AST 24 15 - 41 U/L   ALT 14 (L) 17 - 63 U/L   Alkaline Phosphatase 47 38 - 126 U/L   Total Bilirubin 3.5 (H) 0.3 - 1.2 mg/dL   GFR calc non Af Amer >60 >60 mL/min   GFR calc Af Amer >60 >60 mL/min    Comment: (NOTE) The eGFR has been calculated using the CKD EPI equation. This calculation has not been validated in all clinical situations. eGFR's persistently <60 mL/min signify possible Chronic Kidney Disease.    Anion gap 13 5 - 15  CBC with Diff     Status: Abnormal   Collection Time: 07/03/17  4:04 PM  Result Value Ref Range   WBC 6.3 4.0 - 10.5 K/uL   RBC 5.09 4.22 - 5.81 MIL/uL   Hemoglobin 17.1 (H) 13.0 - 17.0 g/dL   HCT 46.9 39.0 - 52.0 %   MCV 92.1 78.0 - 100.0 fL   MCH 33.6 26.0 - 34.0 pg   MCHC 36.5 (H) 30.0 - 36.0 g/dL   RDW 13.1 11.5 - 15.5 %   Platelets 183 150 - 400 K/uL   Neutrophils Relative % 58 %   Neutro Abs 3.7 1.7 - 7.7 K/uL   Lymphocytes Relative 33 %   Lymphs Abs 2.1 0.7 - 4.0 K/uL   Monocytes Relative 9 %   Monocytes Absolute 0.6 0.1 - 1.0 K/uL   Eosinophils Relative 0 %   Eosinophils Absolute 0.0 0.0 - 0.7 K/uL   Basophils Relative 0 %   Basophils Absolute 0.0 0.0 - 0.1 K/uL  Ethanol     Status: None   Collection Time: 07/03/17  4:05 PM  Result Value Ref Range   Alcohol, Ethyl (B) <10 <10 mg/dL    Comment:        LOWEST DETECTABLE LIMIT FOR SERUM ALCOHOL IS  10 mg/dL FOR MEDICAL PURPOSES ONLY    Metabolic Disorder Labs:  Lab Results  Component Value Date   HGBA1C 5.2 12/15/2015   MPG 103 12/15/2015   MPG 111 07/13/2015   Lab Results  Component Value Date   PROLACTIN 26.7 (H) 12/15/2015   PROLACTIN 47.9 (H) 07/13/2015   Lab Results  Component Value Date   CHOL 261 (H) 12/15/2015   TRIG 63 12/15/2015   HDL 69 12/15/2015   CHOLHDL 3.8 12/15/2015   VLDL 13 12/15/2015   LDLCALC 179 (H) 12/15/2015   LDLCALC 181 (H) 07/13/2015   Current Medications: Current Facility-Administered Medications  Medication Dose Route Frequency Provider Last Rate Last Dose  . acetaminophen (TYLENOL) tablet 650 mg  650 mg Oral Q6H PRN Patrecia Pour, NP      . alum & mag hydroxide-simeth (MAALOX/MYLANTA) 200-200-20 MG/5ML suspension 30 mL  30 mL Oral Q4H PRN Patrecia Pour, NP      . benztropine (COGENTIN) tablet 0.5 mg  0.5 mg Oral QHS Patrecia Pour, NP   0.5 mg at 07/04/17 2317  . citalopram (CELEXA) tablet 20 mg  20 mg Oral QHS Patrecia Pour, NP   20 mg at 07/04/17 2317  . diphenhydrAMINE (BENADRYL) injection 25 mg  25 mg Intravenous Q6H PRN Chisom Aust I, NP      . haloperidol (HALDOL) tablet 5 mg  5 mg Oral QHS Patrecia Pour, NP   5 mg at 07/04/17 2317  . haloperidol lactate (HALDOL) injection 5 mg  5 mg Intramuscular Q6H PRN Lindell Spar I, NP      . hydrOXYzine (ATARAX/VISTARIL)  tablet 25 mg  25 mg Oral Q6H PRN Roselia Snipe I, NP      . LORazepam (ATIVAN) injection 1 mg  1 mg Intravenous Q6H PRN Drequan Ironside I, NP      . magnesium hydroxide (MILK OF MAGNESIA) suspension 30 mL  30 mL Oral Daily PRN Patrecia Pour, NP      . mirtazapine (REMERON) tablet 15 mg  15 mg Oral QHS Jasiel Apachito I, NP       PTA Medications: Medications Prior to Admission  Medication Sig Dispense Refill Last Dose  . ARIPiprazole (ABILIFY) 2 MG tablet Take 1 tablet (2 mg total) by mouth daily. 30 tablet 0 06/04/2017 at Unknown time  . benztropine (COGENTIN) 0.5 MG  tablet Take 1 tablet (0.5 mg total) by mouth at bedtime. 30 tablet 0 06/03/2017 at Unknown time  . citalopram (CELEXA) 10 MG tablet Take 1 tablet (10 mg total) by mouth at bedtime. 30 tablet 0 06/04/2017 at Unknown time  . haloperidol (HALDOL) 5 MG tablet Take 5 mg by mouth at bedtime.   07/02/2017 at Unknown time  . hydrOXYzine (ATARAX/VISTARIL) 50 MG tablet Take 1 tablet (50 mg total) by mouth at bedtime as needed (sleep). 30 tablet 0 06/03/2017 at Unknown time   Musculoskeletal: Strength & Muscle Tone: within normal limits Gait & Station: normal Patient leans: N/A  Psychiatric Specialty Exam: Physical Exam  Constitutional: He is oriented to person, place, and time. He appears well-developed.  I concur with PE done in ED.  HENT:  Head: Normocephalic.  Eyes: Pupils are equal, round, and reactive to light.  Neck: Normal range of motion.  Cardiovascular: Normal rate.  Respiratory: Effort normal.  GI: Soft.  Genitourinary:  Genitourinary Comments: Deferred  Musculoskeletal: Normal range of motion.  Neurological: He is alert and oriented to person, place, and time.  Skin: Skin is warm and dry.  Psychiatric: His speech is normal and behavior is normal. Judgment and thought content normal. His mood appears not anxious. His affect is not angry, not blunt, not labile and not inappropriate. Cognition and memory are normal. He exhibits a depressed mood.    Review of Systems  Constitutional: Negative.   HENT: Negative.   Eyes: Negative.   Respiratory: Negative.   Cardiovascular: Negative.   Gastrointestinal: Negative.   Genitourinary: Negative.   Musculoskeletal: Negative.   Skin: Negative.   Neurological: Negative.   Endo/Heme/Allergies: Negative.   Psychiatric/Behavioral: Positive for depression (Rates #5) and hallucinations (Psychosis). Negative for memory loss, substance abuse and suicidal ideas. The patient is nervous/anxious and has insomnia.   All other systems reviewed and are  negative.   Blood pressure (!) 147/94, pulse (!) 140, temperature 98.3 F (36.8 C), temperature source Oral, resp. rate 20, height '5\' 9"'$  (1.753 m), weight 67.1 kg (148 lb), SpO2 100 %.Body mass index is 21.86 kg/m.  General Appearance: Patient presents very quiet, in a bugundy hospital scrub,  Eye Contact::  Minimal  Speech:  Slow, soft spoken  Volume:  Decreased  Mood:  Depressed per presentation,   Affect:  Flat and Restricted  Thought Process:  Disorganized and Descriptions of Associations: Tangential  Orientation:  Full (Time, Place, and Person)  Thought Content:  Unable to this information at this time, patient is a poor historian  Suicidal Thoughts:  Unable to obtain this information at this time, patient is a poor historian.  Homicidal Thoughts:  Unable to obtain this information at this time, patient is a poor historian.  Memory:  Immediate;   Poor Recent;   Poor Remote;   Poor  Judgement:  Impaired  Insight:  Lacking  Psychomotor Activity:  Decreased  Concentration:  Poor  Recall:  Poor  Fund of Knowledge:Limited at this time.  Language: Fair  Akathisia:  Negative  Handed: Right handed.  AIMS (if indicated):     Assets:  Desire for Improvement Physical Health Vocational/Educational  ADL's:  Intact  Cognition: WNL  Sleep:  Number of Hours: 6.75   Treatment Plan/Recommendations: 1. Admit for crisis management and stabilization, estimated length of stay 3-5 days.   2. Medication management to reduce current symptoms to base line and improve the patient's overall level of functioning; Continue with Haldol 5 mg Q hs for mood control, Citalopram 10 mg Q hs for depression, Hydroxyzine 25 mg Q 6 hours for anxiety. Initiated. Initiated Mirtazapine 15 mg Q hs for depression/Insomnia.  3. Treat health problems as indicated.  4. Develop treatment plan to decrease risk of relapse upon discharge and the need for readmission.  5. Psycho-social education regarding relapse  prevention and self care.  6. Health care follow up as needed for medical problems.  7. Review, reconcile, and reinstate any pertinent home medications for other health issues where appropriate. 8. Call for consults with hospitalist for any additional specialty patient care services as needed.  Observation Level/Precautions:  15 minute checks  LABS: Per ED, UDS negative for all substances.  Psychotherapy: Group counseling sessions.  Medications: See MAR  Consultations:  As needed  Discharge Concerns Mood stability, Safety   Length of stay: 5-7 days  Others: Admit to 500-hall.   I certify that inpatient services furnished can reasonably be expected to improve the patient's condition.    Physician Treatment Plan for Primary Diagnosis: Major depressive disorder, recurrent episodes  ([place])  Long Term Goal(s): Improvement in symptoms so as ready for discharge  Short Term Goals: Ability to identify changes in lifestyle to reduce recurrence of condition will improve and Ability to disclose and discuss suicidal ideas  Physician Treatment Plan for Secondary Diagnosis: Principal Problem: Major depressive disorder, recurrent episodes, severe ([place])  Long Term Goal(s): Improvement in symptoms so as ready for discharge  Short Term Goals: Ability to identify and develop effective coping behaviors will improve, Compliance with prescribed medications will improve and Ability to identify triggers associated with stress or mental health issues will improve  Lindell Spar, PMHNP, FNP-BC 1/5/201912:49 PM

## 2017-07-05 NOTE — [facility] Counselor (Signed)
After consultation with staff, it was decided not to attempt PSA today due to patient's agitation, now sleeping.  Follow-up tomorrow.  Ambrose MantleMareida Grossman-Orr, LCSW 07/05/2017, 3:14 PM

## 2017-07-05 NOTE — Progress Notes (Signed)
Pt remains on 1:1 continuous observation.  Pt is in bed sleeping.  Pt remains safe on unit

## 2017-07-06 DIAGNOSIS — F333 Major depressive disorder, recurrent, severe with psychotic symptoms: Secondary | ICD-10-CM

## 2017-07-06 NOTE — [facility] Counselor (Signed)
Adult Comprehensive Assessment  Patient ID: Peter Golden, male DOB: 19-Feb-1981, 37 y.o. 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 Is not returning to that job. Family Relationships:   Denies stressors Housing / Lack of housing: Denies stressors Financial/Income:  Very stressful because of not having money - only Educational psychologist Social Relationships:  Denies stressors - states his friends help him a lot Substance Abuse:  Denies stressors, does not use anything Bereavement/Losses:  Denies stressors  Living/Environment/Situation:  Living Arrangements: Non-relatives/Best friend who is his roommate Living conditions (as described by patient or guardian): good, has his own room How long has patient lived in current situation?: 3 years or more  Relationship History:  Single Sexually active:  not asked  Family History:  Does patient have children?: No  Childhood History:  By whom was/is the patient raised?: Both parents Additional childhood history information: Normal childhood Description of patient's relationship with caregiver when they were a child: good Patient's description of current relationship with people who raised him/her: still good with both parents - talks to them on the phone when he has money to do so. 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) What is the longest time patient has a held a job?: 2 years Where was the patient employed at that time?: [employer] Has patient ever been in the [employer]?: No Has patient ever served in combat?: No Guns/weapons in the home:  No  Surveyor, quantity Resources:  Architect: Educational psychologist, school insurance  Alcohol/Substance Abuse: Denies all use Alcohol/Substance Abuse Treatment Hx: Denies past history Has alcohol/substance abuse ever caused legal problems?: No  Social Support System:  Conservation officer, nature Support System: Good Describe Community Support System:  Best friend Type of faith/religion: Ephriam Knuckles How does patient's faith help to cope with current illness?: Go to church, pray  Leisure/Recreation:  Leisure and Hobbies:  Play soccer, go to school  Strengths/Needs:  What things does the patient do well?:  Math In what areas does patient struggle / problems for patient: "I don't have any problems right now."  Discharge Plan:  Does patient have access to transportation?: Yes Will patient be returning to same living situation after discharge?: Yes Currently receiving community mental health services: Yes, [facility] Does patient have financial barriers related to discharge medications?: Yes Patient description of barriers related to discharge medications: no income, even though he has insurance through his school  Summary/Recommendations:  Summary and Recommendations (to be completed by the evaluator): Patient is a  37yo male admitted under IVC with auditory hallucinations, aggression,  paranoia, delusions, lack of sleep and intermittent medication adherence.  He was [facility] at [facility] in June 2017.  Primary stressors include recently training for a new job and losing sleep, finances, school performance, and family being in [place] where he is from, so having few supports.  Patient will benefit from crisis stabilization, medication evaluation, group therapy and psychoeducation, in addition to case management for discharge planning. At discharge it is recommended that Patient adhere to the established discharge plan and continue in treatment.   Ambrose MantleMareida Grossman-Orr, LCSW 07/06/2017, 2:53 PM

## 2017-07-06 NOTE — Progress Notes (Signed)
[facility] Post 1:1 Observation Documentation  For the first (8) hours following discontinuation of 1:1 precautions, a progress note entry by nursing staff should be documented at least every 2 hours, reflecting the patient's behavior, condition, mood, and conversation.  Use the progress notes for additional entries.  Time 1:1 discontinued:  1153  Patient's Behavior:  Pt is calmly resting on bed at this time.  Patient's Condition:  Pt is safe in the unit and maintained on Q 15 min rounds.    Patient's Conversation:  Pt asked if he was having thoughts of hurting others or self, pt denied and verbally agreed to seek staff if need be.  Bethann PunchesJane O Dashauna Heymann 07/06/2017, 3:12 PM

## 2017-07-06 NOTE — Progress Notes (Signed)
Patient ID: Peter Golden, male   DOB: Apr 29, 1981, 37 y.o.   MRN: [id number]  1:1 Note  Pt at this time is in bed resting with eyes closed. Pt does not look to be in any distress at this time. 1:1 staff is present in room with Pt at this time. 1:1 monitoring continues for Pt's safety. 15-minute safety checks also continues at this time.

## 2017-07-06 NOTE — Progress Notes (Signed)
[facility] Post 1:1 Observation Documentation  For the first (8) hours following discontinuation of 1:1 precautions, a progress note entry by nursing staff should be documented at least every 2 hours, reflecting the patient's behavior, condition, mood, and conversation.  Use the progress notes for additional entries.  Time 1:1 discontinued:  1153  Patient's Behavior:  Pt is calm and cooperative  Patient's Condition: Pt is  Safe    Patient's Conversation:  Currently at lunch  Bethann PunchesJane O Cardell Rachel 07/06/2017, 3:09 PM

## 2017-07-06 NOTE — [facility] Group Notes (Signed)
[facility]  Date/Time:  07/06/2017  11:00AM-12:00PM  Type of Therapy and Topic:  Group Therapy:  Music and Mood  Participation Level:  Active   Description of Group: In this process group, members listened to a variety of genres of music and identified that different types of music evoke different responses.  Patients were encouraged to identify music that was soothing for them and music that was energizing for them.  Patients discussed how this knowledge can help with wellness and recovery in various ways including managing depression and anxiety as well as encouraging healthy sleep habits.    Therapeutic Goals: 1. Patients will explore the impact of different varieties of music on mood 2. Patients will verbalize the thoughts they have when listening to different types of music 3. Patients will identify music that is soothing to them as well as music that is energizing to them 4. Patients will discuss how to use this knowledge to assist in maintaining wellness and recovery 5. Patients will explore the use of music as a coping skill  Summary of Patient Progress:  At the beginning of group, patient expressed he felt good and at the end of group when he danced a little and talked, he gave 2 "thumbs up."  Therapeutic Modalities: Solution Focused Brief Therapy Activity   Ambrose MantleMareida Grossman-Orr, LCSW

## 2017-07-06 NOTE — Progress Notes (Signed)
[facility] Post 1:1 Observation Documentation  For the first (8) hours following discontinuation of 1:1 precautions, a progress note entry by nursing staff should be documented at least every 2 hours, reflecting the patient's behavior, condition, mood, and conversation.  Use the progress notes for additional entries.  Time 1:1 discontinued:  1153  Patient's Behavior:  Pt is calm, bright and pleasant.  Patient's Condition:  Pt is safe in the unit.  Patient's Conversation:  Pt is currently at dinner.  Bethann PunchesJane O Megumi Treaster 07/06/2017, 6:49 PM

## 2017-07-06 NOTE — Progress Notes (Signed)
Patient ID: Peter Golden, male   DOB: 12/10/1980, 36 y.o.   MRN: [id number]  1:1 Note  Pt at this time is in bed resting with eyes closed. Pt does not look to be in any distress at this time. 1:1 staff is present in room with Pt at this time. 1:1 monitoring continues for Pt's safety. 15-minute safety checks also continues at this time.    

## 2017-07-06 NOTE — Progress Notes (Signed)
1:1 Note Pt is in the bed at this time sleeping, pt has been calm and no behavioral problem has been observed or reported. Remains on 1:1, staff in the room with pt for safety purpose, will continue to monitor.

## 2017-07-06 NOTE — Progress Notes (Signed)
[facility] Post 1:1 Observation Documentation  For the first (8) hours following discontinuation of 1:1 precautions, a progress note entry by nursing staff should be documented at least every 2 hours, reflecting the patient's behavior, condition, mood, and conversation.  Use the progress notes for additional entries.  Time 1:1 discontinued:  1153  Patient's Behavior:  Pt is calm at this time. Patient's Condition:  Pt is safely seated in the dayroom at this time watching TV with peers.  Patient's Conversation:  Pt explained of being off 1:1 and encouraged to come to staff with any need he may have. Pt verbalized understanding.    Bethann PunchesJane O Manasseh Pittsley 07/06/2017, 10:54 AM
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
## 2017-07-07 DIAGNOSIS — R451 Restlessness and agitation: Secondary | ICD-10-CM

## 2017-07-07 DIAGNOSIS — F39 Unspecified mood [affective] disorder: Secondary | ICD-10-CM

## 2017-07-07 MED ORDER — POTASSIUM CHLORIDE CRYS ER 20 MEQ PO TBCR
20.0000 meq | EXTENDED_RELEASE_TABLET | Freq: Two times a day (BID) | ORAL | Status: AC
  Administered 2017-07-07 – 2017-07-09 (×4): 20 meq via ORAL
  Filled 2017-07-07 (×4): qty 1

## 2017-07-07 NOTE — Plan of Care (Signed)
  Safety: Periods of time without injury will increase 07/07/2017 2229 - Progressing by Delos HaringPhillips, Peace Noyes A, RN Note Pt safe on the unit at this time

## 2017-07-07 NOTE — Progress Notes (Signed)
D: Pt sleep majority of the evening, pt kept to himself this evening.  A: Pt was offered support and encouragement. Pt was given scheduled medications. Pt was encourage to attend groups. Q 15 minute checks were done for safety.   R: safety maintained on unit.

## 2017-07-07 NOTE — Progress Notes (Signed)
Recreation Therapy Notes  Date: 07/07/17 Time: 1000 Location: 500 Hall Dayroom  Group Topic: Communication  Goal Area(s) Addresses:  Patient will effectively communicate with peers in group.  Patient will verbalize benefit of healthy communication. Patient will verbalize positive effect of healthy communication on post d/c goals.  Patient will identify communication techniques that made activity effective for group.   Intervention: Geometrical pictures, pencils, blank paper  Activity: Back to Back Drawings.  Each patient was given an opportunity to describe a picture to their peers.  The peers listening were to draw the picture as it was being described by the person talking.  Patients were unable to ask any clarifying questions of the person speaking.  Education: Communication, Discharge Planning  Education Outcome: Acknowledges understanding/In group clarification offered/Needs additional education.   Clinical Observations/Feedback: Pt did not attend group.    Brynn Reznik, LRT/CTRS        Suzane Vanderweide A 07/07/2017 12:31 PM 

## 2017-07-07 NOTE — Progress Notes (Signed)
DAR NOTE: Pt present with flat affect and depressed mood in the unit. Pt has been isolating himself, has been bed most of the time. Pt denies physical pain, took all his meds as scheduled. As per self inventory, pt had a good night sleep, good appetite, high energy, and good concentration. Pt rate depression at 10, hopeless ness at 10, and anxiety 10. Pt's safety ensured with 15 minute and environmental checks. Pt currently denies SI/HI and A/V hallucinations. Pt verbally agrees to seek staff if SI/HI or A/VH occurs and to consult with staff before acting on these thoughts. Will continue POC.

## 2017-07-07 NOTE — [facility] Group Notes (Signed)
LCSW Group Therapy Note  07/07/2017 1:15pm  Type of Therapy/Topic:  Group Therapy:  Balance in Life  Participation Level:  Minimal  Description of Group:    This group will address the concept of balance and how it feels and looks when one is unbalanced. Patients will be encouraged to process areas in their lives that are out of balance and identify reasons for remaining unbalanced. Facilitators will guide patients in utilizing problem-solving interventions to address and correct the stressor making their life unbalanced. Understanding and applying boundaries will be explored and addressed for obtaining and maintaining a balanced life. Patients will be encouraged to explore ways to assertively make their unbalanced needs known to significant others in their lives, using other group members and facilitator for support and feedback.  Therapeutic Goals: 1. Patient will identify two or more emotions or situations they have that consume much of in their lives. 2. Patient will identify signs/triggers that life has become out of balance:  3. Patient will identify two ways to set boundaries in order to achieve balance in their lives:  4. Patient will demonstrate ability to communicate their needs through discussion and/or role plays  Summary of Patient Progress:  Stayed the entire time, engaged throughout.  Minimal insight.  Stated he had problems with concentration before he came in, "but I am fine now."    Therapeutic Modalities:   Cognitive Behavioral Therapy Solution-Focused Therapy Assertiveness Training  Ida RogueRodney B Kiaya [place], [place]CS[place] 07/07/2017 5:17 PM

## 2017-07-07 NOTE — Progress Notes (Signed)
Recreation Therapy Notes  INPATIENT RECREATION THERAPY ASSESSMENT  Patient Details Name: Peter Golden MRN: [id number] DOB: Dec 26, 1980 Today's Date: 07/07/2017  Patient Stressors: (Pt did not identify any stressors.)  Pt stated he has no idea why he is here.  Coping Skills:   Art/Dance, Talking, Music, Sports  Pt stated playing soccer is a coping skill for him.  Personal Challenges: (Pt identified no personal challenges)  Leisure Interests (2+):  Nature - Other (Comment), Individual - TV, Social - Friends, Garment/textile technologistCommunity - Other (Comment)(go out to eat; [place]  Awareness of Community Resources:  Yes  [place] Resources:  Library, [place], Public affairs consultantestaurants, [place], Programmer, [place]  Current Use: Yes  Patient Strengths:  Math; computers  Patient Identified Areas of Improvement:  Communication  Current Recreation Participation:  2 times a week  Patient Goal for Hospitalization:  "To be healthy"  [place]  [place]idence[place]  Current [place] (including self-harm):  No  Current HI:  No  Consent to Intern Participation: N/A     Caroll RancherMarjette Elick Golden, LRT/CTRS  Lillia AbedLindsay, [name] 07/07/2017, 1:10 PM

## 2017-07-07 NOTE — Progress Notes (Signed)
[facility] MD Progress Note  07/07/2017 3:51 PM Peter Golden  MRN:  [id number]   Subjective:  Pj reports "  I am doing okay"  Objective: Peter Golden is awake, alert and oriented. Seen resting in bed. Patient was evaluated by MD and NP. Reports he can't remember why he is here. Reports previous inpatient treatment for the "same thing". Patient denies that he is taken daily medications or that he is followed by psychiatry. Patient denies illicite drug uses or EtoH abuse. Patient continues to reports financial concerns.  Denies suicidal or homicidal ideation during this asssement. Denies auditory or visual hallucination and does not appear to be responding to internal stimuli. Patient reports he is medication compliant without mediation side effects. Denies depression or depressive symptoms. Support, encouragement and reassurance was provided.   Principal Problem: Schizophrenia (HCC) Diagnosis:   Patient Active Problem List   Diagnosis Date Noted  . Major depressive disorder, recurrent episode, severe, with psychosis (HCC) [F33.3] 07/04/2017  . Severe episode of recurrent major depressive disorder, with psychotic features (HCC) [F33.3]   . 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 Hyperlipidemia [E78.5] 07/13/2015  . Schizophrenia (HCC) [F20.9] 07/12/2015   Total Time spent with patient: 20 minutes  Past Psychiatric History:   Past Medical History:  Past Medical History:  Diagnosis Date  . Schizo affective schizophrenia (HCC)    History reviewed. No pertinent surgical history. Family History:  Family History  Problem Relation Age of Onset  . Mental illness Neg Hx    Family Psychiatric  History:  Social History:  Social History   Substance and Sexual Activity  Alcohol Use No   Comment: BAC was not available at time of assessment     Social History   Substance and Sexual Activity  Drug Use No   Comment: UDS not available at time of assessment    Social  History   Socioeconomic History  . Marital status: Single    Spouse name: None  . Number of children: None  . Years of education: None  . Highest education level: None  Social Needs  . Financial resource strain: None  . Food insecurity - worry: None  . Food insecurity - inability: None  . Transportation needs - medical: None  . Transportation needs - non-medical: None  Occupational History  . None  Tobacco Use  . Smoking status: Never Smoker  . Smokeless tobacco: Never Used  Substance and Sexual Activity  . Alcohol use: No    Comment: BAC was not available at time of assessment  . Drug use: No    Comment: UDS not available at time of assessment  . Sexual activity: No  Other Topics Concern  . None  Social History Narrative  . None   Additional Social History:                         Sleep: Fair  Appetite:  Fair  Current Medications: Current Facility-Administered Medications  Medication Dose Route Frequency Provider Last Rate Last Dose  . acetaminophen (TYLENOL) tablet 650 mg  650 mg Oral Q6H PRN Charm Rings, NP      . alum & mag hydroxide-simeth (MAALOX/MYLANTA) 200-200-20 MG/5ML suspension 30 mL  30 mL Oral Q4H PRN Charm Rings, NP      . benztropine (COGENTIN) tablet 0.5 mg  0.5 mg Oral QHS Charm Rings, NP   0.5 mg at 07/06/17 2120  . diphenhydrAMINE (BENADRYL) injection 25 mg  25 mg Intramuscular Q6H PRN Armandina StammerNwoko, Agnes I, NP      . haloperidol (HALDOL) tablet 10 mg  10 mg Oral QHS Izediuno, Delight OvensVincent A, MD   10 mg at 07/06/17 2120  . haloperidol lactate (HALDOL) injection 5 mg  5 mg Intramuscular Q6H PRN Nwoko, Nicole KindredAgnes I, NP      . hydrOXYzine (ATARAX/VISTARIL) tablet 25 mg  25 mg Oral Q6H PRN Nwoko, Agnes I, NP      . LORazepam (ATIVAN) injection 1 mg  1 mg Intramuscular Q6H PRN Nwoko, Agnes I, NP      . magnesium hydroxide (MILK OF MAGNESIA) suspension 30 mL  30 mL Oral Daily PRN Charm RingsLord, Jamison Y, NP      . mirtazapine (REMERON) tablet 15 mg  15 mg  Oral QHS Armandina StammerNwoko, Agnes I, NP   15 mg at 07/06/17 2120  . potassium chloride SA (K-DUR,KLOR-CON) CR tablet 20 mEq  20 mEq Oral BID Oneta RackLewis, Royal Beirne N, NP        Lab Results: No results found for this or any previous visit (from the past 48 hour(s)).  Blood Alcohol level:  Lab Results  Component Value Date   ETH <10 07/03/2017   ETH <10 06/04/2017    Metabolic Disorder Labs: Lab Results  Component Value Date   HGBA1C 5.2 12/15/2015   MPG 103 12/15/2015   MPG 111 07/13/2015   Lab Results  Component Value Date   PROLACTIN 26.7 (H) 12/15/2015   PROLACTIN 47.9 (H) 07/13/2015   Lab Results  Component Value Date   CHOL 261 (H) 12/15/2015   TRIG 63 12/15/2015   HDL 69 12/15/2015   CHOLHDL 3.8 12/15/2015   VLDL 13 12/15/2015   LDLCALC 179 (H) 12/15/2015   LDLCALC 181 (H) 07/13/2015    Physical Findings: AIMS: Facial and Oral Movements Muscles of Facial Expression: None, normal Lips and Perioral Area: None, normal Jaw: None, normal Tongue: None, normal,Extremity Movements Upper (arms, wrists, hands, fingers): None, normal Lower (legs, knees, ankles, toes): None, normal, Trunk Movements Neck, shoulders, hips: None, normal, Overall Severity Severity of abnormal movements (highest score from questions above): None, normal Incapacitation due to abnormal movements: None, normal Patient's awareness of abnormal movements (rate only patient's report): No Awareness, Dental Status Current problems with teeth and/or dentures?: No Does patient usually wear dentures?: No  CIWA:    COWS:     Musculoskeletal: Strength & Muscle Tone: within normal limits Gait & Station: normal Patient leans: N/A  Psychiatric Specialty Exam: Physical Exam  Vitals reviewed. Constitutional: He appears well-developed.  Cardiovascular: Normal rate.  Neurological: He is alert.  Psychiatric: He has a normal mood and affect. His behavior is normal.    Review of Systems  Psychiatric/Behavioral: Negative  for hallucinations, substance abuse and suicidal ideas.    Blood pressure 108/72, pulse 84, temperature 98.1 F (36.7 C), temperature source Oral, resp. rate 18, height 5\' 9"  (1.753 m), weight 67.1 kg (148 lb), SpO2 100 %.Body mass index is 21.86 kg/m.  General Appearance: Casual  Eye Contact:  Good  Speech:  Clear and Coherent  Volume:  Normal  Mood:  Depressed  Affect:  Congruent  Thought Process:  Coherent  Orientation:  Other:  person and place  Thought Content:  Hallucinations: None  Suicidal Thoughts:  No  Homicidal Thoughts:  No  Memory:  Immediate;   Fair Recent;   Fair Remote;   Fair  Judgement:  Fair  Insight:  Lacking  Psychomotor Activity:  Normal  Concentration:  Concentration: Fair  Recall:  [employer] of Knowledge:  Fair  Language:  Fair  Akathisia:  No  Handed:  Right  AIMS (if indicated):     Assets:  Communication Skills Desire for Improvement Financial Resources/Insurance Physical Health Resilience  ADL's:  Intact  Cognition:  WNL  Sleep:  Number of Hours: 6.25     Treatment Plan Summary: Daily contact with patient to assess and evaluate symptoms and progress in treatment and Medication management   Will continue today 07/07/2017 plan as below except where it is noted.  Mood control.     - Continue Haldol 10 mg po Q hs.  EPS.      - Continue Cogentin 0.5 mg po Q hs.  Agitation/psychosis.     - Continue the agitation protocols as ordered prn Q 6 hours.  Anxiety.     - Hydroxyzine 25 mg po prn Q 6 hours.  Depression/Insomnia.      - Continue Mirtazapine 15 mg po Q hs..        - Patient to attend & participate in the group sessions.       - SW to work on the discharge disposition.  Oneta Rack, NP 07/07/2017, 3:51 PM

## 2017-07-08 DIAGNOSIS — F329 Major depressive disorder, single episode, unspecified: Secondary | ICD-10-CM

## 2017-07-08 DIAGNOSIS — F209 Schizophrenia, unspecified: Principal | ICD-10-CM

## 2017-07-08 LAB — COMPREHENSIVE METABOLIC PANEL
ALK PHOS: 40 U/L (ref 38–126)
ALT: 15 U/L — ABNORMAL LOW (ref 17–63)
AST: 37 U/L (ref 15–41)
Albumin: 4.4 g/dL (ref 3.5–5.0)
Anion gap: 8 (ref 5–15)
BILIRUBIN TOTAL: 2.9 mg/dL — AB (ref 0.3–1.2)
BUN: 13 mg/dL (ref 6–20)
CALCIUM: 9.6 mg/dL (ref 8.9–10.3)
CO2: 27 mmol/L (ref 22–32)
CREATININE: 1.07 mg/dL (ref 0.61–1.24)
Chloride: 106 mmol/L (ref 101–111)
Glucose, Bld: 85 mg/dL (ref 65–99)
Potassium: 4.2 mmol/L (ref 3.5–5.1)
Sodium: 141 mmol/L (ref 135–145)
TOTAL PROTEIN: 7.4 g/dL (ref 6.5–8.1)

## 2017-07-08 NOTE — Progress Notes (Signed)
D: Pt denies SI/HI/AHV. Pt is pleasant and cooperative. Pt isolated to his room, pt came out for medications and went back to his room  A: Pt was offered support and encouragement. Pt was given scheduled medications. Pt was encourage to attend groups. Q 15 minute checks were done for safety.   R: safety maintained on unit.

## 2017-07-08 NOTE — [facility] Suicide Risk Assessment (Signed)
[facility] INPATIENT:  Family/Significant Other Suicide Prevention Education  Suicide Prevention Education:  Education Completed; No one has been identified by the patient as the family member/significant other with whom the patient will be residing, and identified as the person(s) who will aid the patient in the event of a mental health crisis (suicidal ideations/suicide attempt).  With written consent from the patient, the family member/significant other has been provided the following suicide prevention education, prior to the and/or following the discharge of the patient.  The suicide prevention education provided includes the following:  Suicide risk factors  Suicide prevention and interventions  National Suicide Hotline telephone number  [facility] telephone number  [facility] Emergency Assistance 911  [facility] and/or [facility]idential Mobile [facility] telephone number  Request made of family/significant other to:  Remove weapons (e.g., guns, rifles, knives), all items previously/currently identified as safety concern.    Remove drugs/medications (over-the-counter, prescriptions, illicit drugs), all items previously/currently identified as a safety concern.  The family member/significant other verbalizes understanding of the suicide prevention education information provided.  The family member/significant other agrees to remove the items of safety concern listed above. The patient did not endorse SI at the time of admission, nor did the patient c/o SI during the stay here.  SPE not required. I did talk to roommate, Benjamine MolaDosse Amedin, roommate who also IVC'd patient, [phone], and went over treatment team recommendations and crises plan.  Ida RogueRodney B Evangeline Utley 07/08/2017, 3:06 PM

## 2017-07-08 NOTE — Progress Notes (Signed)
Recreation Therapy Notes  Date: 07/08/17 Time: 1000 Location: 500 Hall Dayroom  Group Topic: Self-Expression  Goal Area(s) Addresses:  Patient will successfully identify positive attributes about themselves.  Patient will successfully identify benefit of improved self-expression.   Intervention: Black or brown Scientist, clinical (histocompatibility and immunogenetics)construction paper, black paint, crayons, paint brushes, straws  Activity: Scratch Art.  Patients were given Golden sheet of construction paper.  Patients were to take the crayons and color the paper with any colors, designs and shapes they chose.  Patients would then take the black paint and paint over their picture.  Lastly, patients would take Golden straw and scrap off the paint in any design they chose to reveal the colors underneath.   Education:  Self-Esteem, Building control surveyorDischarge Planning.   Education Outcome: Acknowledges education/In group clarification offered/Needs additional education  Clinical Observations/Feedback: Pt did not attend group.    Peter Golden, Peter Golden         Peter RancherLindsay, Peter Golden 07/08/2017 11:58 AM

## 2017-07-08 NOTE — Tx Team (Signed)
Interdisciplinary Treatment and Diagnostic Plan Update  07/08/2017 Time of Session: 3:11 PM  Peter Golden MRN: [id number]  Principal Diagnosis: Schizophrenia [facility])  Secondary Diagnoses: Principal Problem:   Schizophrenia ([place]) Active Problems:   Major depressive disorder, recurrent episode, severe, with psychosis ([place])   Current Medications:  Current Facility-Administered Medications  Medication Dose Route Frequency Provider Last Rate Last Dose  . acetaminophen (TYLENOL) tablet 650 mg  650 mg Oral Q6H PRN Patrecia Pour, NP      . alum & mag hydroxide-simeth (MAALOX/MYLANTA) 200-200-20 MG/5ML suspension 30 mL  30 mL Oral Q4H PRN Patrecia Pour, NP      . benztropine (COGENTIN) tablet 0.5 mg  0.5 mg Oral QHS Patrecia Pour, NP   0.5 mg at 07/07/17 2249  . diphenhydrAMINE (BENADRYL) injection 25 mg  25 mg Intramuscular Q6H PRN Nwoko, Agnes I, NP      . haloperidol (HALDOL) tablet 10 mg  10 mg Oral QHS Izediuno, Laruth Bouchard, MD   10 mg at 07/07/17 2249  . haloperidol lactate (HALDOL) injection 5 mg  5 mg Intramuscular Q6H PRN Nwoko, Herbert Pun I, NP      . hydrOXYzine (ATARAX/VISTARIL) tablet 25 mg  25 mg Oral Q6H PRN Nwoko, Agnes I, NP      . LORazepam (ATIVAN) injection 1 mg  1 mg Intramuscular Q6H PRN Nwoko, Agnes I, NP      . magnesium hydroxide (MILK OF MAGNESIA) suspension 30 mL  30 mL Oral Daily PRN Patrecia Pour, NP      . mirtazapine (REMERON) tablet 15 mg  15 mg Oral QHS Lindell Spar I, NP   15 mg at 07/07/17 2249  . potassium chloride SA (K-DUR,KLOR-CON) CR tablet 20 mEq  20 mEq Oral BID Derrill Center, NP   20 mEq at 07/08/17 0809    PTA Medications: Medications Prior to Admission  Medication Sig Dispense Refill Last Dose  . ARIPiprazole (ABILIFY) 2 MG tablet Take 1 tablet (2 mg total) by mouth daily. 30 tablet 0 06/04/2017 at Unknown time  . benztropine (COGENTIN) 0.5 MG tablet Take 1 tablet (0.5 mg total) by mouth at bedtime. 30 tablet 0 06/03/2017 at Unknown time  .  citalopram (CELEXA) 10 MG tablet Take 1 tablet (10 mg total) by mouth at bedtime. 30 tablet 0 06/04/2017 at Unknown time  . haloperidol (HALDOL) 5 MG tablet Take 5 mg by mouth at bedtime.   07/02/2017 at Unknown time  . hydrOXYzine (ATARAX/VISTARIL) 50 MG tablet Take 1 tablet (50 mg total) by mouth at bedtime as needed (sleep). 30 tablet 0 06/03/2017 at Unknown time    Patient Stressors: Financial difficulties Medication change or noncompliance  Patient Strengths: General fund of knowledge Physical Health  Treatment Modalities: Medication Management, Group therapy, Case management,  1 to 1 session with clinician, Psychoeducation, Recreational therapy.   Physician Treatment Plan for Primary Diagnosis: Schizophrenia ([place]) Long Term Goal(s): Improvement in symptoms so as ready for discharge  Short Term Goals:    Medication Management: Evaluate patient's response, side effects, and tolerance of medication regimen.  Therapeutic Interventions: 1 to 1 sessions, Unit Group sessions and Medication administration.  Evaluation of Outcomes: Adequate for Discharge  Physician Treatment Plan for Secondary Diagnosis: Principal Problem:   Schizophrenia ([place]) Active Problems:   Major depressive disorder, recurrent episode, severe, with psychosis ([place])   Long Term Goal(s): Improvement in symptoms so as ready for discharge  Short Term Goals:    Medication Management: Evaluate patient's response,  side effects, and tolerance of medication regimen.  Therapeutic Interventions: 1 to 1 sessions, Unit Group sessions and Medication administration.  Evaluation of Outcomes: Adequate for Discharge   RN Treatment Plan for Primary Diagnosis: Schizophrenia ([place]) Long Term Goal(s): Knowledge of disease and therapeutic regimen to maintain health will improve  Short Term Goals: Ability to identify and develop effective coping behaviors will improve and Compliance with prescribed medications will  improve  Medication Management: RN will administer medications as ordered by provider, will assess and evaluate patient's response and provide education to patient for prescribed medication. RN will report any adverse and/or side effects to prescribing provider.  Therapeutic Interventions: 1 on 1 counseling sessions, Psychoeducation, Medication administration, Evaluate responses to treatment, Monitor vital signs and CBGs as ordered, Perform/monitor CIWA, COWS, AIMS and Fall Risk screenings as ordered, Perform wound care treatments as ordered.  Evaluation of Outcomes: Adequate for Discharge   LCSW Treatment Plan for Primary Diagnosis: Schizophrenia [facility]) Long Term Goal(s): Safe transition to appropriate next level of care at discharge, Engage patient in therapeutic group addressing interpersonal concerns.  Short Term Goals: Engage patient in aftercare planning with referrals and resources  Therapeutic Interventions: Assess for all discharge needs, 1 to 1 time with Social worker, Explore available resources and support systems, Assess for adequacy in community support network, Educate family and significant other(s) on suicide prevention, Complete Psychosocial Assessment, Interpersonal group therapy.  Evaluation of Outcomes: Met  Return home, follow up [facility]   Progress in Treatment: Attending groups: Yes Participating in groups: Yes Taking medication as prescribed: Yes Toleration medication: Yes, no side effects reported at this time Family/Significant other contact made: Yes Patient understands diagnosis: No  Limited insight Discussing patient identified problems/goals with staff: Yes Medical problems stabilized or resolved: Yes Denies suicidal/homicidal ideation: Yes Issues/concerns per patient self-inventory: None Other: N/A  New problem(s) identified: None identified at this time.   New Short Term/Long Term Goal(s): "I want help getting a job"   Discharge Plan or Barriers:    Reason for Continuation of Hospitalization: Paranoia Mood instability Medication stabilization   Estimated Length of Stay: Likely d/c tomorrow  Attendees: Patient: Peter Golden 07/08/2017  3:11 PM  Physician: Maris Berger, MD 07/08/2017  3:11 PM  Nursing: Sena Hitch, RN 07/08/2017  3:11 PM  RN Care Manager: Lars Pinks, RN 07/08/2017  3:11 PM  Social Worker: Ripley Fraise 07/08/2017  3:11 PM  Recreational Therapist: Winfield Cunas 07/08/2017  3:11 PM  Other: Norberto Sorenson 07/08/2017  3:11 PM  Other:  07/08/2017  3:11 PM    Scribe for Treatment Team:  Roque Lias LCSW 07/08/2017 3:11 PM

## 2017-07-08 NOTE — [facility] Group Notes (Signed)
Pt did not attend group. 

## 2017-07-08 NOTE — Progress Notes (Signed)
[facility] MD Progress Note  07/08/2017 12:36 PM Peter Golden  MRN:  [id number] Subjective:    Peter Golden is a 37 y/o M with history of MDD with psychotic features versus schizophrenia who was admitted on IVC with worsening depression, agitation, disorganized behaviors, and apparent response to internal stimuli. Pt was agitated towards his roommate and the in the ED. He was restarted on haldol, and he has been calm and cooperative after initial presentation.  Today upon evaluation, pt reports he is doing "okay." He is sleeping well and his appetite is good. He denies SI/HI/AH/VH. He remains somewhat vague in regards to his complaints, and the reasons for coming to the hospital. Pt is able to identify multiple stressors including financial strain, loss of his job, feeling socially/culturally isolated, struggling in school, feeling disappointment with his level of success after being in the [place] for 5 years, and not understanding why the police took him to the hospital. He denies any recollection of being agitated/aggressive. When asked what would help him the most, pt replied, "I need a good job." Pt was encouraged to engage with his counseling department at [employer] where he is studying accounting, and pt was encouraged to engage in international student club or similar group to help begin to build a broader social support network. Pt verbalized good understanding. He requests to discharge to home tomorrow, and he is in agreement to continue his current treatment regimen without changes.    Principal Problem: Schizophrenia ([place]) Diagnosis:   Patient Active Problem List   Diagnosis Date Noted  . Major depressive disorder, recurrent episode, severe, with psychosis ([place]) [F33.3] 07/04/2017  . Severe episode of recurrent major depressive disorder, with psychotic features ([place]) [F33.3]   . 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 Hyperlipidemia [E78.5] 07/13/2015  . Schizophrenia ([place])  [F20.9] 07/12/2015   Total Time spent with patient: 30 minutes  Past Psychiatric History: see H&P  Past Medical History:  Past Medical History:  Diagnosis Date  . Schizo affective schizophrenia ([place])    History reviewed. No pertinent surgical history. Family History:  Family History  Problem Relation Age of Onset  . Mental illness Neg Hx    Family Psychiatric  History: see H&P Social History:  Social History   Substance and Sexual Activity  Alcohol Use No   Comment: BAC was not available at time of assessment     Social History   Substance and Sexual Activity  Drug Use No   Comment: UDS not available at time of assessment    Social History   Socioeconomic History  . Marital status: Single    Spouse name: None  . Number of children: None  . Years of education: None  . Highest education level: None  Social Needs  . Financial resource strain: None  . Food insecurity - worry: None  . Food insecurity - inability: None  . Transportation needs - medical: None  . Transportation needs - non-medical: None  Occupational History  . None  Tobacco Use  . Smoking status: Never Smoker  . Smokeless tobacco: Never Used  Substance and Sexual Activity  . Alcohol use: No    Comment: BAC was not available at time of assessment  . Drug use: No    Comment: UDS not available at time of assessment  . Sexual activity: No  Other Topics Concern  . None  Social History Narrative  . None   Additional Social History:  Sleep: Good  Appetite:  Good  Current Medications: Current Facility-Administered Medications  Medication Dose Route Frequency Provider Last Rate Last Dose  . acetaminophen (TYLENOL) tablet 650 mg  650 mg Oral Q6H PRN Patrecia Pour, NP      . alum & mag hydroxide-simeth (MAALOX/MYLANTA) 200-200-20 MG/5ML suspension 30 mL  30 mL Oral Q4H PRN Patrecia Pour, NP      . benztropine (COGENTIN) tablet 0.5 mg  0.5 mg Oral QHS Patrecia Pour, NP   0.5 mg at 07/07/17 2249  . diphenhydrAMINE (BENADRYL) injection 25 mg  25 mg Intramuscular Q6H PRN Nwoko, Agnes I, NP      . haloperidol (HALDOL) tablet 10 mg  10 mg Oral QHS Izediuno, Laruth Bouchard, MD   10 mg at 07/07/17 2249  . haloperidol lactate (HALDOL) injection 5 mg  5 mg Intramuscular Q6H PRN Nwoko, Herbert Pun I, NP      . hydrOXYzine (ATARAX/VISTARIL) tablet 25 mg  25 mg Oral Q6H PRN Nwoko, Agnes I, NP      . LORazepam (ATIVAN) injection 1 mg  1 mg Intramuscular Q6H PRN Nwoko, Agnes I, NP      . magnesium hydroxide (MILK OF MAGNESIA) suspension 30 mL  30 mL Oral Daily PRN Patrecia Pour, NP      . mirtazapine (REMERON) tablet 15 mg  15 mg Oral QHS Lindell Spar I, NP   15 mg at 07/07/17 2249  . potassium chloride SA (K-DUR,KLOR-CON) CR tablet 20 mEq  20 mEq Oral BID Derrill Center, NP   20 mEq at 07/08/17 0809    Lab Results:  Results for orders placed or performed during the hospital encounter of 07/04/17 (from the past 48 hour(s))  Comprehensive metabolic panel     Status: Abnormal   Collection Time: 07/08/17  7:26 AM  Result Value Ref Range   Sodium 141 135 - 145 mmol/L   Potassium 4.2 3.5 - 5.1 mmol/L   Chloride 106 101 - 111 mmol/L   CO2 27 22 - 32 mmol/L   Glucose, Bld 85 65 - 99 mg/dL   BUN 13 6 - 20 mg/dL   Creatinine, Ser 1.07 0.61 - 1.24 mg/dL   Calcium 9.6 8.9 - 10.3 mg/dL   Total Protein 7.4 6.5 - 8.1 g/dL   Albumin 4.4 3.5 - 5.0 g/dL   AST 37 15 - 41 U/L   ALT 15 (L) 17 - 63 U/L   Alkaline Phosphatase 40 38 - 126 U/L   Total Bilirubin 2.9 (H) 0.3 - 1.2 mg/dL   GFR calc non Af Amer >60 >60 mL/min   GFR calc Af Amer >60 >60 mL/min    Comment: (NOTE) The eGFR has been calculated using the CKD EPI equation. This calculation has not been validated in all clinical situations. eGFR's persistently <60 mL/min signify possible Chronic Kidney Disease.    Anion gap 8 5 - 15    Comment: Performed at [facility], [place] [address],  [place],  [address]    Blood Alcohol level:  Lab Results  Component Value Date   [facility] <10 07/03/2017   ETH <10 74/25/9563    Metabolic Disorder Labs: Lab Results  Component Value Date   HGBA1C 5.2 12/15/2015   MPG 103 12/15/2015   MPG 111 07/13/2015   Lab Results  Component Value Date   PROLACTIN 26.7 (H) 12/15/2015   PROLACTIN 47.9 (H) 07/13/2015   Lab Results  Component Value Date   CHOL  261 (H) 12/15/2015   TRIG 63 12/15/2015   HDL 69 12/15/2015   CHOLHDL 3.8 12/15/2015   VLDL 13 12/15/2015   LDLCALC 179 (H) 12/15/2015   LDLCALC 181 (H) 07/13/2015    Physical Findings: AIMS: Facial and Oral Movements Muscles of Facial Expression: None, normal Lips and Perioral Area: None, normal Jaw: None, normal Tongue: None, normal,Extremity Movements Upper (arms, wrists, hands, fingers): None, normal Lower (legs, knees, ankles, toes): None, normal, Trunk Movements Neck, shoulders, hips: None, normal, Overall Severity Severity of abnormal movements (highest score from questions above): None, normal Incapacitation due to abnormal movements: None, normal Patient's awareness of abnormal movements (rate only patient's report): No Awareness, Dental Status Current problems with teeth and/or dentures?: No Does patient usually wear dentures?: No  CIWA:    COWS:     Musculoskeletal: Strength & Muscle Tone: within normal limits Gait & Station: normal Patient leans: N/A  Psychiatric Specialty Exam: Physical Exam  Nursing note and vitals reviewed.   Review of Systems  Constitutional: Negative for chills and fever.  Respiratory: Negative for cough.   Cardiovascular: Negative for chest pain and palpitations.  Gastrointestinal: Negative for heartburn and nausea.  Neurological: Negative for dizziness and headaches.  Psychiatric/Behavioral: Negative for depression, hallucinations and suicidal ideas. The patient is not nervous/anxious.     Blood pressure 133/79, pulse 96,  temperature 98.1 F (36.7 C), temperature source Oral, resp. rate 18, height '5\' 9"'$  (1.753 m), weight 67.1 kg (148 lb), SpO2 100 %.Body mass index is 21.86 kg/m.  General Appearance: Casual and Fairly Groomed  Eye Contact:  Good  Speech:  Clear and Coherent and Normal Rate  Volume:  Normal  Mood:  Anxious and Depressed  Affect:  Appropriate, Congruent and Constricted  Thought Process:  Coherent and Goal Directed  Orientation:  Full (Time, Place, and Person)  Thought Content:  Logical  Suicidal Thoughts:  No  Homicidal Thoughts:  No  Memory:  Immediate;   Good Recent;   Good Remote;   Good  Judgement:  Fair  Insight:  Fair  Psychomotor Activity:  Normal  Concentration:  Concentration: Fair  Recall:  Good  Fund of Knowledge:  Fair  Language:  Fair  Akathisia:  No  Handed:    AIMS (if indicated):     Assets:  Armed forces logistics/support/administrative officer Physical Health Resilience Social Support  ADL's:  Intact  Cognition:  WNL  Sleep:  Number of Hours: 6.75     Treatment Plan Summary: Daily contact with patient to assess and evaluate symptoms and progress in treatment and Medication management. Pt reports some ongoing depression and anxiety regarding his stressors of financial strain, difficulties at school, and poor social support. He is in agreement to continue his current treatment regimen with anticipation of discharge to outpatient level of services tomorrow.  - Continue inpatient hospitalization  Mood control.     - Continue Haldol 10 mg po Q hs.  EPS.      - Continue Cogentin 0.5 mg po Q hs.  Agitation/psychosis.     - Continue the agitation protocols as ordered prn Q 6 hours.  Anxiety.     - Hydroxyzine 25 mg po prn Q 6 hours.  Depression/Insomnia.      - Continue Mirtazapine 15 mg po Q hs..        - Patient to attend & participate in the group sessions.       - SW to work on the discharge disposition.    Pennelope Bracken, MD  07/08/2017, 12:36 PM

## 2017-07-08 NOTE — Plan of Care (Signed)
  Education: Will be free of psychotic symptoms 07/08/2017 2224 - Progressing by Delos HaringPhillips, Kendyll Huettner A, RN Note Pt denies AVH at this time

## 2017-07-09 DIAGNOSIS — R440 Auditory hallucinations: Secondary | ICD-10-CM

## 2017-07-09 MED ORDER — HALOPERIDOL DECANOATE 100 MG/ML IM SOLN
50.0000 mg | INTRAMUSCULAR | Status: DC
Start: 2017-07-09 — End: 2017-07-09
  Administered 2017-07-09: 50 mg via INTRAMUSCULAR
  Filled 2017-07-09: qty 0.5

## 2017-07-09 MED ORDER — HALOPERIDOL DECANOATE 100 MG/ML IM SOLN: 50.0000 mg | INTRAMUSCULAR | 0 refills | Status: DC

## 2017-07-09 MED ORDER — MIRTAZAPINE 15 MG PO TABS: 15.0000 mg | ORAL_TABLET | Freq: Every day | ORAL | 0 refills | Status: DC

## 2017-07-09 MED ORDER — BENZTROPINE MESYLATE 0.5 MG PO TABS: 0.5000 mg | ORAL_TABLET | Freq: Every day | ORAL | 0 refills | Status: DC

## 2017-07-09 MED ORDER — HYDROXYZINE HCL 25 MG PO TABS: 25.0000 mg | ORAL_TABLET | Freq: Four times a day (QID) | ORAL | 0 refills | Status: DC | PRN

## 2017-07-09 MED ORDER — HALOPERIDOL 10 MG PO TABS: 10.0000 mg | ORAL_TABLET | Freq: Every day | ORAL | 0 refills | Status: DC

## 2017-07-09 NOTE — [facility] Suicide Risk Assessment (Signed)
[facility] Discharge Suicide Risk Assessment   Principal Problem: Schizophrenia [facility]) Discharge Diagnoses:  Patient Active Problem List   Diagnosis Date Noted  . Major depressive disorder, recurrent episode, severe, with psychosis (HCC) [F33.3] 07/04/2017  . Severe episode of recurrent major depressive disorder, with psychotic features (HCC) [F33.3]   . 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 Hyperlipidemia [E78.5] 07/13/2015  . Schizophrenia (HCC) [F20.9] 07/12/2015    Total Time spent with patient: 30 minutes  Musculoskeletal: Strength & Muscle Tone: within normal limits Gait & Station: normal Patient leans: N/A  Psychiatric Specialty Exam: Review of Systems  Constitutional: Negative for chills and fever.  HENT: Negative for hearing loss.   Respiratory: Negative for cough.   Cardiovascular: Negative for chest pain.  Psychiatric/Behavioral: Negative for depression, hallucinations and suicidal ideas. The patient is not nervous/anxious.     Blood pressure 133/79, pulse 96, temperature 98.1 F (36.7 C), temperature source Oral, resp. rate 18, height 5\' 9"  (1.753 m), weight 67.1 kg (148 lb), SpO2 100 %.Body mass index is 21.86 kg/m.  General Appearance: Casual and Fairly Groomed  Patent attorney::  Good  Speech:  Clear and Coherent and Normal Rate  Volume:  Normal  Mood:  Euthymic  Affect:  Appropriate and Congruent  Thought Process:  Coherent and Goal Directed  Orientation:  Full (Time, Place, and Person)  Thought Content:  Logical  Suicidal Thoughts:  No  Homicidal Thoughts:  No  Memory:  Immediate;   Fair Recent;   Fair Remote;   Fair  Judgement:  Fair  Insight:  Fair  Psychomotor Activity:  Normal  Concentration:  Fair  Recall:  [employer] of Knowledge:Fair  Language: Fair  Akathisia:  No  Handed:    AIMS (if indicated):     Assets:  Manufacturing systems engineer Physical Health Resilience Social Support  Sleep:  Number of Hours: 6.75  Cognition: WNL  ADL's:  Intact    Mental Status Per Nursing Assessment::   On Admission:  NA  Demographic Factors:  Male, Low socioeconomic status and Unemployed  Loss Factors: Financial problems/change in socioeconomic status  Historical Factors: Impulsivity  Risk Reduction Factors:   Living with another person, especially a relative, Positive social support, Positive therapeutic relationship and Positive coping skills or problem solving skills  Continued Clinical Symptoms:  Schizophrenia:   Paranoid or undifferentiated type  Cognitive Features That Contribute To Risk:  None    Suicide Risk:  Minimal: No identifiable suicidal ideation.  Patients presenting with no risk factors but with morbid ruminations; may be classified as minimal risk based on the severity of the depressive symptoms  Follow-up Information    [facility] Follow up.   Why:  Social worker will set next follow-up appointment Contact information: [address] [place] [address] [phone]         Subjective Data:  Peter Golden is a 37 y/o M with history of MDD with psychotic features versus schizophrenia who was admitted on IVC with worsening depression, agitation, disorganized behaviors, and apparent response to internal stimuli. Pt was agitated towards his roommate and the in the ED. He was restarted on haldol, and he has been calm and cooperative after initial presentation.  Today upon evaluation, he reports that he is doing well. He denies SI/HI/AH/VH. He is sleeping well and his appetite is good. He denies any problems with his medications. He expresses that he is motivated to get back to classes and to find a new job. Pt also requests to be started  on long-acting injectable form of his medication as he sometimes has trouble remembering to take it every day. Discussed with patient that he will continue on the oral form of the medication for at least a few weeks until he has follow up with his outpatient doctor, and the official  recommendation is to continue for 60-90 days on the oral form until completely transitioning to just the injection. Pt verbalized good understanding. He was able to engage in safety planning including [facility] if he feels unable to maintain his own safety. Pt had no further questions, comments, or concerns.   Plan Of Care/Follow-up recommendations:    - Discharge to outpatient level of care  Mood control. - Continue Haldol 10 mg po Q hs.      - Start Haldol Decanoate 50mg  IM q30 Days (first dose today 07/09/17)  EPS. - Continue Cogentin 0.5 mg po Q hs.  Anxiety. - Hydroxyzine 25 mg po prn Q 6 hours.  Depression/Insomnia. - Continue Mirtazapine 15 mg po Q hs..  Activity:  as tolerated Diet:  normal Tests:  NA Other:  see above for DC plan  Peter Likenshristopher T Rogene Meth, MD 07/09/2017, 10:22 AM

## 2017-07-09 NOTE — Progress Notes (Signed)
Recreation Therapy Notes  INPATIENT RECREATION TR PLAN  Patient Details Name: Peter Golden MRN: [id number] DOB: January 14, 1981 Today's Date: 07/09/2017  Rec Therapy Plan Is patient appropriate for Therapeutic Recreation?: Yes Treatment times per week: about 3 days Estimated Length of Stay: 5-7 days TR Treatment/Interventions: Group participation (Comment)  Discharge Criteria Pt will be discharged from therapy if:: Discharged Treatment plan/goals/alternatives discussed and agreed upon by:: Patient/family  Discharge Summary Short term goals set: Pt will attend and participate in recreational therapy sessions. Short term goals met: Not met Reason goals not met: Pt did not attend recreational therapy groups. Therapeutic equipment acquired: N/A Reason patient discharged from therapy: Discharge from hospital Pt/family agrees with progress & goals achieved: Yes Date patient discharged from therapy: 07/09/17    Victorino Sparrow, LRT/CTRS  Ria Comment, Angelli Baruch A 07/09/2017, 2:55 PM

## 2017-07-09 NOTE — Progress Notes (Signed)
Recreation Therapy Notes  Date: 07/09/17 Time: 1000 Location: 500 Hall Dayroom  Group Topic: Anger Management  Goal Area(s) Addresses:  Patient will identify triggers for anger.  Patient will identify physical reaction to anger.   Patient will identify benefit of using coping skills when angry.  Intervention: Worksheet  Activity: Intro to Anger Management.  LRT gave pt a worksheet on anger.  Patients were to identify at least 3 situations that lead to feelings of anger, ways they act differently when angry and problems caused by anger.  Education: Anger Management, Discharge Planning   Education Outcome: Acknowledges education/In group clarification offered/Needs additional education.   Clinical Observations/Feedback: Pt did not attend group.     Caroll RancherMarjette Noemi Ishmael, LRT/CTRS          Caroll RancherLindsay, Nisa Decaire A 07/09/2017 12:39 PM

## 2017-07-09 NOTE — Plan of Care (Signed)
Pt did not attend any recreational therapy groups.   Caroll RancherMarjette Dava Rensch, LRT/CTRS
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
## 2017-07-09 NOTE — Progress Notes (Signed)
Patient ID: Peter Golden, male   DOB: 22-Nov-1980, 37 y.o.   MRN: [id number] Patient discharged to home/self care in the presence of his roommate.  All discharge instructions reviewed patient acknowledged understanding of all discharge instructions. Patient bright and smiling.

## 2018-04-15 ENCOUNTER — Emergency Department (HOSPITAL_COMMUNITY): Payer: Worker's Compensation | Admitting: Anesthesiology

## 2018-04-15 ENCOUNTER — Inpatient Hospital Stay (HOSPITAL_COMMUNITY)
Admission: EM | Admit: 2018-04-15 | Discharge: 2018-04-29 | DRG: 957 | Disposition: A | Discharge status: Home Health | Payer: Worker's Compensation | Attending: General Surgery | Admitting: General Surgery

## 2018-04-15 ENCOUNTER — Inpatient Hospital Stay (HOSPITAL_COMMUNITY): Payer: Worker's Compensation

## 2018-04-15 ENCOUNTER — Encounter (HOSPITAL_COMMUNITY): Admission: EM | Disposition: A | Discharge status: Home Health | Payer: Self-pay | Source: Home / Self Care

## 2018-04-15 ENCOUNTER — Emergency Department (HOSPITAL_COMMUNITY): Payer: Worker's Compensation

## 2018-04-15 ENCOUNTER — Encounter (HOSPITAL_COMMUNITY): Payer: Self-pay | Admitting: Emergency Medicine

## 2018-04-15 DIAGNOSIS — K429 Umbilical hernia without obstruction or gangrene: Secondary | ICD-10-CM | POA: Diagnosis present

## 2018-04-15 DIAGNOSIS — S3609XA Other injury of spleen, initial encounter: Secondary | ICD-10-CM | POA: Diagnosis present

## 2018-04-15 DIAGNOSIS — S3639XA Other injury of stomach, initial encounter: Secondary | ICD-10-CM | POA: Diagnosis present

## 2018-04-15 DIAGNOSIS — J690 Pneumonitis due to inhalation of food and vomit: Secondary | ICD-10-CM | POA: Diagnosis not present

## 2018-04-15 DIAGNOSIS — J90 Pleural effusion, not elsewhere classified: Secondary | ICD-10-CM | POA: Diagnosis not present

## 2018-04-15 DIAGNOSIS — Z23 Encounter for immunization: Secondary | ICD-10-CM

## 2018-04-15 DIAGNOSIS — D62 Acute posthemorrhagic anemia: Secondary | ICD-10-CM | POA: Diagnosis present

## 2018-04-15 DIAGNOSIS — W3400XA Accidental discharge from unspecified firearms or gun, initial encounter: Secondary | ICD-10-CM | POA: Diagnosis not present

## 2018-04-15 DIAGNOSIS — S27808A Other injury of diaphragm, initial encounter: Secondary | ICD-10-CM | POA: Diagnosis present

## 2018-04-15 DIAGNOSIS — S3991XA Unspecified injury of abdomen, initial encounter: Secondary | ICD-10-CM | POA: Diagnosis present

## 2018-04-15 DIAGNOSIS — E876 Hypokalemia: Secondary | ICD-10-CM | POA: Diagnosis not present

## 2018-04-15 DIAGNOSIS — S36591A Other injury of transverse colon, initial encounter: Secondary | ICD-10-CM | POA: Diagnosis present

## 2018-04-15 DIAGNOSIS — S31631A Puncture wound without foreign body of abdominal wall, left upper quadrant with penetration into peritoneal cavity, initial encounter: Secondary | ICD-10-CM | POA: Diagnosis present

## 2018-04-15 DIAGNOSIS — K567 Ileus, unspecified: Secondary | ICD-10-CM | POA: Diagnosis not present

## 2018-04-15 DIAGNOSIS — S37032A Laceration of left kidney, unspecified degree, initial encounter: Secondary | ICD-10-CM | POA: Diagnosis present

## 2018-04-15 DIAGNOSIS — R578 Other shock: Secondary | ICD-10-CM | POA: Diagnosis present

## 2018-04-15 DIAGNOSIS — K661 Hemoperitoneum: Secondary | ICD-10-CM | POA: Diagnosis present

## 2018-04-15 DIAGNOSIS — E861 Hypovolemia: Secondary | ICD-10-CM | POA: Diagnosis present

## 2018-04-15 DIAGNOSIS — Y249XXA Unspecified firearm discharge, undetermined intent, initial encounter: Secondary | ICD-10-CM

## 2018-04-15 DIAGNOSIS — I9589 Other hypotension: Secondary | ICD-10-CM

## 2018-04-15 DIAGNOSIS — J189 Pneumonia, unspecified organism: Secondary | ICD-10-CM

## 2018-04-15 DIAGNOSIS — Z9689 Presence of other specified functional implants: Secondary | ICD-10-CM

## 2018-04-15 DIAGNOSIS — R319 Hematuria, unspecified: Secondary | ICD-10-CM | POA: Diagnosis present

## 2018-04-15 DIAGNOSIS — Z978 Presence of other specified devices: Secondary | ICD-10-CM

## 2018-04-15 DIAGNOSIS — Z9889 Other specified postprocedural states: Secondary | ICD-10-CM

## 2018-04-15 DIAGNOSIS — S31139A Puncture wound of abdominal wall without foreign body, unspecified quadrant without penetration into peritoneal cavity, initial encounter: Secondary | ICD-10-CM

## 2018-04-15 HISTORY — PX: LAPAROTOMY: SHX154

## 2018-04-15 LAB — CBC
HCT: 36.7 % — ABNORMAL LOW (ref 39.0–52.0)
HCT: 35.8 % — ABNORMAL LOW (ref 39.0–52.0)
HEMATOCRIT: 41.5 % (ref 39.0–52.0)
HEMOGLOBIN: 13.7 g/dL (ref 13.0–17.0)
HEMOGLOBIN: 12.1 g/dL — AB (ref 13.0–17.0)
Hemoglobin: 12.3 g/dL — ABNORMAL LOW (ref 13.0–17.0)
MCH: 31.8 pg (ref 26.0–34.0)
MCH: 30.7 pg (ref 26.0–34.0)
MCH: 30.9 pg (ref 26.0–34.0)
MCHC: 33 g/dL (ref 30.0–36.0)
MCHC: 33.5 g/dL (ref 30.0–36.0)
MCHC: 33.8 g/dL (ref 30.0–36.0)
MCV: 96.3 fL (ref 80.0–100.0)
MCV: 91.5 fL (ref 80.0–100.0)
MCV: 91.6 fL (ref 80.0–100.0)
Platelets: 205 10*3/uL (ref 150–400)
Platelets: 155 10*3/uL (ref 150–400)
Platelets: 142 10*3/uL — ABNORMAL LOW (ref 150–400)
RBC: 4.31 MIL/uL (ref 4.22–5.81)
RBC: 4.01 MIL/uL — AB (ref 4.22–5.81)
RBC: 3.91 MIL/uL — AB (ref 4.22–5.81)
RDW: 12.6 % (ref 11.5–15.5)
RDW: 12.8 % (ref 11.5–15.5)
RDW: 13.2 % (ref 11.5–15.5)
WBC: 10.4 10*3/uL (ref 4.0–10.5)
WBC: 6.9 10*3/uL (ref 4.0–10.5)
WBC: 7.8 10*3/uL (ref 4.0–10.5)
nRBC: 0 % (ref 0.0–0.2)
nRBC: 0 % (ref 0.0–0.2)
nRBC: 0 % (ref 0.0–0.2)

## 2018-04-15 LAB — COMPREHENSIVE METABOLIC PANEL
ALBUMIN: 3.5 g/dL (ref 3.5–5.0)
ALK PHOS: 35 U/L — AB (ref 38–126)
ALT: 12 U/L (ref 0–44)
ALT: 27 U/L (ref 0–44)
ANION GAP: 15 (ref 5–15)
ANION GAP: 10 (ref 5–15)
AST: 32 U/L (ref 15–41)
AST: 49 U/L — ABNORMAL HIGH (ref 15–41)
Albumin: 3.6 g/dL (ref 3.5–5.0)
Alkaline Phosphatase: 35 U/L — ABNORMAL LOW (ref 38–126)
BUN: 12 mg/dL (ref 6–20)
BUN: 12 mg/dL (ref 6–20)
CHLORIDE: 104 mmol/L (ref 98–111)
CO2: 20 mmol/L — AB (ref 22–32)
CO2: 21 mmol/L — AB (ref 22–32)
CREATININE: 1.15 mg/dL (ref 0.61–1.24)
Calcium: 8.6 mg/dL — ABNORMAL LOW (ref 8.9–10.3)
Calcium: 9 mg/dL (ref 8.9–10.3)
Chloride: 107 mmol/L (ref 98–111)
Creatinine, Ser: 1.36 mg/dL — ABNORMAL HIGH (ref 0.61–1.24)
GFR calc Af Amer: >60 mL/min (ref >60)
GFR calc Af Amer: >60 mL/min (ref >60)
GFR calc non Af Amer: >60 mL/min (ref >60)
GFR calc non Af Amer: >60 mL/min (ref >60)
GLUCOSE: 125 mg/dL — AB (ref 70–99)
GLUCOSE: 196 mg/dL — AB (ref 70–99)
POTASSIUM: 2.6 mmol/L — AB (ref 3.5–5.1)
Potassium: 3.1 mmol/L — ABNORMAL LOW (ref 3.5–5.1)
SODIUM: 139 mmol/L (ref 135–145)
SODIUM: 138 mmol/L (ref 135–145)
Total Bilirubin: 1.6 mg/dL — ABNORMAL HIGH (ref 0.3–1.2)
Total Bilirubin: 1.8 mg/dL — ABNORMAL HIGH (ref 0.3–1.2)
Total Protein: 5.9 g/dL — ABNORMAL LOW (ref 6.5–8.1)
Total Protein: 5.4 g/dL — ABNORMAL LOW (ref 6.5–8.1)

## 2018-04-15 LAB — POCT I-STAT 3, ART BLOOD GAS (G3+)
BICARBONATE: 25.1 mmol/L (ref 20.0–28.0)
O2 Saturation: 100 %
PH ART: 7.443 (ref 7.350–7.450)
PO2 ART: 461 mmHg — AB (ref 83.0–108.0)
TCO2: 26 mmol/L (ref 22–32)
pCO2 arterial: 35.8 mmHg (ref 32.0–48.0)

## 2018-04-15 LAB — URINALYSIS, ROUTINE W REFLEX MICROSCOPIC
Bacteria, UA: NONE SEEN
Bilirubin Urine: NEGATIVE
Glucose, UA: 50 mg/dL — AB
KETONES UR: NEGATIVE mg/dL
LEUKOCYTES UA: NEGATIVE
Nitrite: NEGATIVE
Protein, ur: 100 mg/dL — AB
RBC / HPF: >50 RBC/hpf — ABNORMAL HIGH (ref 0–5)
Specific Gravity, Urine: 1.011 (ref 1.005–1.030)
pH: 6 (ref 5.0–8.0)

## 2018-04-15 LAB — POCT I-STAT 7, (LYTES, BLD GAS, ICA,H+H)
Acid-base deficit: 4 mmol/L — ABNORMAL HIGH (ref 0.0–2.0)
Acid-base deficit: 4 mmol/L — ABNORMAL HIGH (ref 0.0–2.0)
Bicarbonate: 22.8 mmol/L (ref 20.0–28.0)
Bicarbonate: 22.7 mmol/L (ref 20.0–28.0)
Calcium, Ion: 1.07 mmol/L — ABNORMAL LOW (ref 1.15–1.40)
Calcium, Ion: 1.09 mmol/L — ABNORMAL LOW (ref 1.15–1.40)
HCT: 30 % — ABNORMAL LOW (ref 39.0–52.0)
HCT: 28 % — ABNORMAL LOW (ref 39.0–52.0)
HEMOGLOBIN: 10.2 g/dL — AB (ref 13.0–17.0)
Hemoglobin: 9.5 g/dL — ABNORMAL LOW (ref 13.0–17.0)
O2 SAT: 100 %
O2 Saturation: 100 %
PCO2 ART: 45.1 mmHg (ref 32.0–48.0)
PH ART: 7.301 — AB (ref 7.350–7.450)
PH ART: 7.336 — AB (ref 7.350–7.450)
PO2 ART: 375 mmHg — AB (ref 83.0–108.0)
POTASSIUM: 3.7 mmol/L (ref 3.5–5.1)
POTASSIUM: 3.6 mmol/L (ref 3.5–5.1)
Patient temperature: 34.9
Patient temperature: 34.1
Sodium: 140 mmol/L (ref 135–145)
Sodium: 141 mmol/L (ref 135–145)
TCO2: 24 mmol/L (ref 22–32)
TCO2: 24 mmol/L (ref 22–32)
pCO2 arterial: 41 mmHg (ref 32.0–48.0)
pO2, Arterial: 499 mmHg — ABNORMAL HIGH (ref 83.0–108.0)

## 2018-04-15 LAB — POCT I-STAT, CHEM 8
BUN: 13 mg/dL (ref 6–20)
CALCIUM ION: 1.02 mmol/L — AB (ref 1.15–1.40)
CHLORIDE: 104 mmol/L (ref 98–111)
Creatinine, Ser: 1.3 mg/dL — ABNORMAL HIGH (ref 0.61–1.24)
GLUCOSE: 115 mg/dL — AB (ref 70–99)
HEMATOCRIT: 40 % (ref 39.0–52.0)
Hemoglobin: 13.6 g/dL (ref 13.0–17.0)
Potassium: 2.5 mmol/L — CL (ref 3.5–5.1)
SODIUM: 139 mmol/L (ref 135–145)
TCO2: 21 mmol/L — AB (ref 22–32)

## 2018-04-15 LAB — BASIC METABOLIC PANEL
Anion gap: 7 (ref 5–15)
BUN: 11 mg/dL (ref 6–20)
CO2: 22 mmol/L (ref 22–32)
CREATININE: 1.13 mg/dL (ref 0.61–1.24)
Calcium: 8.6 mg/dL — ABNORMAL LOW (ref 8.9–10.3)
Chloride: 107 mmol/L (ref 98–111)
GFR calc Af Amer: >60 mL/min (ref >60)
GFR calc non Af Amer: >60 mL/min (ref >60)
Glucose, Bld: 174 mg/dL — ABNORMAL HIGH (ref 70–99)
POTASSIUM: 4.1 mmol/L (ref 3.5–5.1)
SODIUM: 136 mmol/L (ref 135–145)

## 2018-04-15 LAB — PROTIME-INR
INR: 1.26
INR: 1.25
PROTHROMBIN TIME: 15.7 s — AB (ref 11.4–15.2)
Prothrombin Time: 15.5 seconds — ABNORMAL HIGH (ref 11.4–15.2)

## 2018-04-15 LAB — RAPID URINE DRUG SCREEN, HOSP PERFORMED
AMPHETAMINES: NOT DETECTED
BENZODIAZEPINES: POSITIVE — AB
Barbiturates: NOT DETECTED
COCAINE: NOT DETECTED
OPIATES: NOT DETECTED
Tetrahydrocannabinol: NOT DETECTED

## 2018-04-15 LAB — CG4 I-STAT (LACTIC ACID): Lactic Acid, Venous: 8.36 mmol/L (ref 0.5–1.9)

## 2018-04-15 LAB — CDS SEROLOGY

## 2018-04-15 LAB — ETHANOL: Alcohol, Ethyl (B): <10 mg/dL (ref <10)

## 2018-04-15 LAB — ABO/RH: ABO/RH(D): A POS

## 2018-04-15 LAB — TRIGLYCERIDES: Triglycerides: 63 mg/dL (ref <150)

## 2018-04-15 LAB — MRSA PCR SCREENING: MRSA BY PCR: NEGATIVE

## 2018-04-15 LAB — BLOOD PRODUCT ORDER (VERBAL) VERIFICATION

## 2018-04-15 LAB — HIV ANTIBODY (ROUTINE TESTING W REFLEX): HIV Screen 4th Generation wRfx: NONREACTIVE

## 2018-04-15 SURGERY — LAPAROTOMY, EXPLORATORY
Anesthesia: General | Site: Abdomen

## 2018-04-15 MED ORDER — PHENYLEPHRINE 40 MCG/ML (10ML) SYRINGE FOR IV PUSH (FOR BLOOD PRESSURE SUPPORT)
PREFILLED_SYRINGE | INTRAVENOUS | Status: AC
  Filled 2018-04-15: qty 30

## 2018-04-15 MED ORDER — PROPOFOL 10 MG/ML IV BOLUS
INTRAVENOUS | Status: DC | PRN
  Administered 2018-04-15: 120 mg via INTRAVENOUS

## 2018-04-15 MED ORDER — DEXAMETHASONE SODIUM PHOSPHATE 10 MG/ML IJ SOLN
INTRAMUSCULAR | Status: AC
  Filled 2018-04-15: qty 2

## 2018-04-15 MED ORDER — PROCHLORPERAZINE MALEATE 10 MG PO TABS
10.0000 mg | ORAL_TABLET | Freq: Four times a day (QID) | ORAL | Status: DC | PRN
  Filled 2018-04-15: qty 1

## 2018-04-15 MED ORDER — LACTATED RINGERS IV SOLN
INTRAVENOUS | Status: DC | PRN
  Administered 2018-04-15: 02:00:00 via INTRAVENOUS

## 2018-04-15 MED ORDER — CALCIUM CHLORIDE 10 % IV SOLN
INTRAVENOUS | Status: AC
  Filled 2018-04-15: qty 20

## 2018-04-15 MED ORDER — PROPOFOL 1000 MG/100ML IV EMUL
INTRAVENOUS | Status: AC
  Filled 2018-04-15: qty 100

## 2018-04-15 MED ORDER — LIDOCAINE 2% (20 MG/ML) 5 ML SYRINGE
INTRAMUSCULAR | Status: AC
  Filled 2018-04-15: qty 10

## 2018-04-15 MED ORDER — FENTANYL CITRATE (PF) 250 MCG/5ML IJ SOLN
INTRAMUSCULAR | Status: DC | PRN
  Administered 2018-04-15 (×2): 100 ug via INTRAVENOUS
  Administered 2018-04-15: 50 ug via INTRAVENOUS

## 2018-04-15 MED ORDER — MIDAZOLAM HCL 2 MG/2ML IJ SOLN: 2.0000 mg | INTRAMUSCULAR | Status: DC | PRN

## 2018-04-15 MED ORDER — FENTANYL CITRATE (PF) 250 MCG/5ML IJ SOLN
INTRAMUSCULAR | Status: AC
  Filled 2018-04-15: qty 5

## 2018-04-15 MED ORDER — CEFAZOLIN SODIUM 1 G IJ SOLR
INTRAMUSCULAR | Status: AC
  Filled 2018-04-15: qty 40

## 2018-04-15 MED ORDER — PROPOFOL 10 MG/ML IV BOLUS
INTRAVENOUS | Status: AC
  Filled 2018-04-15: qty 20

## 2018-04-15 MED ORDER — PHENYLEPHRINE 40 MCG/ML (10ML) SYRINGE FOR IV PUSH (FOR BLOOD PRESSURE SUPPORT)
PREFILLED_SYRINGE | INTRAVENOUS | Status: DC | PRN
  Administered 2018-04-15: 120 ug via INTRAVENOUS

## 2018-04-15 MED ORDER — SUCCINYLCHOLINE CHLORIDE 200 MG/10ML IV SOSY
PREFILLED_SYRINGE | INTRAVENOUS | Status: DC | PRN
  Administered 2018-04-15: 120 mg via INTRAVENOUS

## 2018-04-15 MED ORDER — SODIUM CHLORIDE 0.9 % IV SOLN
INTRAVENOUS | Status: DC | PRN
  Administered 2018-04-15: 500 mL via INTRAVENOUS
  Administered 2018-04-17: 250 mL via INTRAVENOUS

## 2018-04-15 MED ORDER — LIDOCAINE 2% (20 MG/ML) 5 ML SYRINGE
INTRAMUSCULAR | Status: DC | PRN
  Administered 2018-04-15: 80 mg via INTRAVENOUS

## 2018-04-15 MED ORDER — FAMOTIDINE IN NACL 20-0.9 MG/50ML-% IV SOLN
20.0000 mg | INTRAVENOUS | Status: DC
  Administered 2018-04-15 – 2018-04-19 (×5): 20 mg via INTRAVENOUS
  Filled 2018-04-15 (×6): qty 50

## 2018-04-15 MED ORDER — ROCURONIUM BROMIDE 100 MG/10ML IV SOLN
INTRAVENOUS | Status: DC | PRN
  Administered 2018-04-15 (×2): 50 mg via INTRAVENOUS

## 2018-04-15 MED ORDER — DIPHENHYDRAMINE HCL 50 MG/ML IJ SOLN: 12.5000 mg | Freq: Four times a day (QID) | INTRAMUSCULAR | Status: DC | PRN

## 2018-04-15 MED ORDER — FENTANYL CITRATE (PF) 100 MCG/2ML IJ SOLN
INTRAMUSCULAR | Status: AC | PRN
  Administered 2018-04-15: 50 ug via INTRAVENOUS

## 2018-04-15 MED ORDER — ORAL CARE MOUTH RINSE
15.0000 mL | OROMUCOSAL | Status: DC
  Administered 2018-04-15 – 2018-04-16 (×14): 15 mL via OROMUCOSAL

## 2018-04-15 MED ORDER — CEFAZOLIN SODIUM-DEXTROSE 2-3 GM-%(50ML) IV SOLR
INTRAVENOUS | Status: DC | PRN
  Administered 2018-04-15: 2 g via INTRAVENOUS

## 2018-04-15 MED ORDER — PROCHLORPERAZINE EDISYLATE 10 MG/2ML IJ SOLN
5.0000 mg | Freq: Four times a day (QID) | INTRAMUSCULAR | Status: DC | PRN
  Administered 2018-04-18 – 2018-04-19 (×3): 10 mg via INTRAVENOUS
  Filled 2018-04-15 (×3): qty 2

## 2018-04-15 MED ORDER — LACTATED RINGERS IV SOLN
INTRAVENOUS | Status: DC | PRN
  Administered 2018-04-15 (×2): via INTRAVENOUS

## 2018-04-15 MED ORDER — MIDAZOLAM HCL 2 MG/2ML IJ SOLN
2.0000 mg | INTRAMUSCULAR | Status: DC | PRN
  Administered 2018-04-15: 2 mg via INTRAVENOUS
  Filled 2018-04-15: qty 2

## 2018-04-15 MED ORDER — ALBUMIN HUMAN 5 % IV SOLN
INTRAVENOUS | Status: DC | PRN
  Administered 2018-04-15: 02:00:00 via INTRAVENOUS

## 2018-04-15 MED ORDER — MIDAZOLAM HCL 2 MG/2ML IJ SOLN
INTRAMUSCULAR | Status: AC
  Filled 2018-04-15: qty 2

## 2018-04-15 MED ORDER — PROPOFOL 500 MG/50ML IV EMUL
INTRAVENOUS | Status: DC | PRN
  Administered 2018-04-15: 50 ug/kg/min via INTRAVENOUS

## 2018-04-15 MED ORDER — SODIUM CHLORIDE 0.9 % IV SOLN
INTRAVENOUS | Status: DC | PRN
  Administered 2018-04-15 (×2): via INTRAVENOUS

## 2018-04-15 MED ORDER — ACETAMINOPHEN 650 MG RE SUPP: 650.0000 mg | Freq: Four times a day (QID) | RECTAL | Status: DC | PRN

## 2018-04-15 MED ORDER — FENTANYL CITRATE (PF) 100 MCG/2ML IJ SOLN
50.0000 ug | Freq: Once | INTRAMUSCULAR | Status: AC
  Administered 2018-04-15: 50 ug via INTRAVENOUS

## 2018-04-15 MED ORDER — ROCURONIUM BROMIDE 50 MG/5ML IV SOSY
PREFILLED_SYRINGE | INTRAVENOUS | Status: AC
  Filled 2018-04-15: qty 20

## 2018-04-15 MED ORDER — SODIUM CHLORIDE 0.9 % IJ SOLN
INTRAMUSCULAR | Status: AC
  Filled 2018-04-15: qty 10

## 2018-04-15 MED ORDER — EPHEDRINE SULFATE-NACL 50-0.9 MG/10ML-% IV SOSY
PREFILLED_SYRINGE | INTRAVENOUS | Status: DC | PRN
  Administered 2018-04-15: 10 mg via INTRAVENOUS

## 2018-04-15 MED ORDER — MIDAZOLAM HCL 2 MG/2ML IJ SOLN
INTRAMUSCULAR | Status: DC | PRN
  Administered 2018-04-15: 2 mg via INTRAVENOUS

## 2018-04-15 MED ORDER — SODIUM CHLORIDE 0.9 % IV SOLN
1.0000 g | Freq: Two times a day (BID) | INTRAVENOUS | Status: DC
  Administered 2018-04-15 – 2018-04-19 (×9): 1 g via INTRAVENOUS
  Filled 2018-04-15 (×10): qty 1

## 2018-04-15 MED ORDER — PROPOFOL 1000 MG/100ML IV EMUL
5.0000 ug/kg/min | INTRAVENOUS | Status: DC
  Administered 2018-04-15: 50 ug/kg/min via INTRAVENOUS
  Administered 2018-04-15: 30 ug/kg/min via INTRAVENOUS
  Administered 2018-04-15: 40 ug/kg/min via INTRAVENOUS
  Administered 2018-04-16: 30 ug/kg/min via INTRAVENOUS
  Administered 2018-04-16: 15 ug/kg/min via INTRAVENOUS
  Filled 2018-04-15 (×4): qty 100

## 2018-04-15 MED ORDER — EPHEDRINE 5 MG/ML INJ
INTRAVENOUS | Status: AC
  Filled 2018-04-15: qty 10

## 2018-04-15 MED ORDER — ONDANSETRON 4 MG PO TBDP: 4.0000 mg | ORAL_TABLET | Freq: Four times a day (QID) | ORAL | Status: DC | PRN

## 2018-04-15 MED ORDER — ONDANSETRON HCL 4 MG/2ML IJ SOLN
INTRAMUSCULAR | Status: AC
  Filled 2018-04-15: qty 6

## 2018-04-15 MED ORDER — CALCIUM CHLORIDE 10 % IV SOLN
INTRAVENOUS | Status: DC | PRN
  Administered 2018-04-15: 1 g via INTRAVENOUS

## 2018-04-15 MED ORDER — IOHEXOL 300 MG/ML  SOLN
100.0000 mL | Freq: Once | INTRAMUSCULAR | Status: AC | PRN
  Administered 2018-04-15: 100 mL via INTRAVENOUS

## 2018-04-15 MED ORDER — ONDANSETRON HCL 4 MG/2ML IJ SOLN
4.0000 mg | Freq: Four times a day (QID) | INTRAMUSCULAR | Status: DC | PRN
  Administered 2018-04-20: 4 mg via INTRAVENOUS
  Filled 2018-04-15 (×2): qty 2

## 2018-04-15 MED ORDER — DEXTROSE IN LACTATED RINGERS 5 % IV SOLN
INTRAVENOUS | Status: DC
  Administered 2018-04-15 – 2018-04-20 (×11): via INTRAVENOUS

## 2018-04-15 MED ORDER — FENTANYL BOLUS VIA INFUSION
50.0000 ug | INTRAVENOUS | Status: DC | PRN
  Administered 2018-04-16 (×3): 50 ug via INTRAVENOUS
  Filled 2018-04-15: qty 50

## 2018-04-15 MED ORDER — FENTANYL 2500MCG IN NS 250ML (10MCG/ML) PREMIX INFUSION
25.0000 ug/h | INTRAVENOUS | Status: DC
  Administered 2018-04-15 – 2018-04-16 (×2): 100 ug/h via INTRAVENOUS
  Filled 2018-04-15 (×2): qty 250

## 2018-04-15 MED ORDER — 0.9 % SODIUM CHLORIDE (POUR BTL) OPTIME
TOPICAL | Status: DC | PRN
  Administered 2018-04-15: 3000 mL

## 2018-04-15 MED ORDER — SODIUM CHLORIDE 0.9 % IV SOLN
INTRAVENOUS | Status: DC | PRN
  Administered 2018-04-15: 40 ug/min via INTRAVENOUS

## 2018-04-15 MED ORDER — SODIUM CHLORIDE 0.9 % IV SOLN
INTRAVENOUS | Status: AC | PRN
  Administered 2018-04-15 (×2): 1000 mL via INTRAVENOUS

## 2018-04-15 MED ORDER — CHLORHEXIDINE GLUCONATE 0.12% ORAL RINSE (MEDLINE KIT)
15.0000 mL | Freq: Two times a day (BID) | OROMUCOSAL | Status: DC
  Administered 2018-04-15 – 2018-04-16 (×3): 15 mL via OROMUCOSAL

## 2018-04-15 SURGICAL SUPPLY — 54 items
BLADE CLIPPER SURG (BLADE) ×3 IMPLANT
CANISTER SUCT 3000ML PPV (MISCELLANEOUS) ×3 IMPLANT
CHLORAPREP W/TINT 26ML (MISCELLANEOUS) IMPLANT
COVER SURGICAL LIGHT HANDLE (MISCELLANEOUS) ×3 IMPLANT
COVER WAND RF STERILE (DRAPES) ×3 IMPLANT
DRAIN CHANNEL 19F RND (DRAIN) ×3 IMPLANT
DRAPE LAPAROSCOPIC ABDOMINAL (DRAPES) ×3 IMPLANT
DRAPE WARM FLUID 44X44 (DRAPE) ×3 IMPLANT
DRSG OPSITE POSTOP 4X10 (GAUZE/BANDAGES/DRESSINGS) IMPLANT
DRSG OPSITE POSTOP 4X12 (GAUZE/BANDAGES/DRESSINGS) ×3 IMPLANT
DRSG OPSITE POSTOP 4X8 (GAUZE/BANDAGES/DRESSINGS) IMPLANT
ELECT BLADE 4.0 EZ CLEAN MEGAD (MISCELLANEOUS) ×3
ELECT BLADE 6.5 EXT (BLADE) ×3 IMPLANT
ELECT CAUTERY BLADE 6.4 (BLADE) ×3 IMPLANT
ELECT REM PT RETURN 9FT ADLT (ELECTROSURGICAL) ×3
ELECTRODE BLDE 4.0 EZ CLN MEGD (MISCELLANEOUS) ×1 IMPLANT
ELECTRODE REM PT RTRN 9FT ADLT (ELECTROSURGICAL) ×1 IMPLANT
EVACUATOR SILICONE 100CC (DRAIN) ×3 IMPLANT
GLOVE BIO SURGEON STRL SZ 6 (GLOVE) ×3 IMPLANT
GLOVE INDICATOR 6.5 STRL GRN (GLOVE) ×3 IMPLANT
GOWN STRL REUS W/ TWL LRG LVL3 (GOWN DISPOSABLE) ×1 IMPLANT
GOWN STRL REUS W/TWL 2XL LVL3 (GOWN DISPOSABLE) ×3 IMPLANT
GOWN STRL REUS W/TWL LRG LVL3 (GOWN DISPOSABLE) ×2
KIT BASIN OR (CUSTOM PROCEDURE TRAY) ×3 IMPLANT
KIT OSTOMY DRAINABLE 2.75 STR (WOUND CARE) ×3 IMPLANT
KIT TURNOVER KIT B (KITS) ×3 IMPLANT
LIGASURE IMPACT 36 18CM CVD LR (INSTRUMENTS) ×3 IMPLANT
NS IRRIG 1000ML POUR BTL (IV SOLUTION) ×6 IMPLANT
PACK GENERAL/GYN (CUSTOM PROCEDURE TRAY) ×3 IMPLANT
PAD ARMBOARD 7.5X6 YLW CONV (MISCELLANEOUS) ×3 IMPLANT
RELOAD PROXIMATE 75MM BLUE (ENDOMECHANICALS) ×9 IMPLANT
SPECIMEN JAR LARGE (MISCELLANEOUS) IMPLANT
SPONGE LAP 18X18 X RAY DECT (DISPOSABLE) ×12 IMPLANT
STAPLER PROXIMATE 75MM BLUE (STAPLE) ×3 IMPLANT
STAPLER VISISTAT 35W (STAPLE) ×3 IMPLANT
SUCTION POOLE TIP (SUCTIONS) ×3 IMPLANT
SUT ETHILON 2 0 FS 18 (SUTURE) ×3 IMPLANT
SUT PDS AB 1 TP1 96 (SUTURE) ×3 IMPLANT
SUT PDS II 0 TP-1 LOOPED 60 (SUTURE) ×6 IMPLANT
SUT SILK 2 0 SH CR/8 (SUTURE) ×3 IMPLANT
SUT SILK 2 0 TIES 10X30 (SUTURE) ×3 IMPLANT
SUT SILK 3 0 SH CR/8 (SUTURE) ×3 IMPLANT
SUT SILK 3 0 TIES 10X30 (SUTURE) ×3 IMPLANT
SUT VIC AB 2-0 SH 18 (SUTURE) ×3 IMPLANT
SUT VIC AB 3-0 SH 18 (SUTURE) ×3 IMPLANT
SUT VICRYL 4-0 PS2 18IN ABS (SUTURE) IMPLANT
SUT VICRYL AB 2 0 TIES (SUTURE) ×3 IMPLANT
SUT VICRYL AB 3 0 TIES (SUTURE) ×3 IMPLANT
SYSTEM SAHARA CHEST DRAIN ATS (WOUND CARE) ×3 IMPLANT
TOWEL OR 17X24 6PK STRL BLUE (TOWEL DISPOSABLE) ×3 IMPLANT
TOWEL OR 17X26 10 PK STRL BLUE (TOWEL DISPOSABLE) ×3 IMPLANT
TRAY FOLEY MTR SLVR 16FR STAT (SET/KITS/TRAYS/PACK) IMPLANT
TRAY WAYNE PNEUMOTHORAX 14X18 (TRAY / TRAY PROCEDURE) ×3 IMPLANT
YANKAUER SUCT BULB TIP NO VENT (SUCTIONS) IMPLANT
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
## 2018-04-15 NOTE — Transfer of Care (Signed)
Immediate Anesthesia Transfer of Care Note  Patient: Peter Golden  Procedure(s) Performed: EXPLORATORY LAPAROTOMY with splenectomy, transverse colectomy, partial gastrectomy, repair of diaphragmatic hernia, left 14 Fr pigtail tube thoracostomy, repair of umbilical hernia (N/A Abdomen)  Patient Location: NICU  Anesthesia Type:General  Level of Consciousness: Patient remains intubated per anesthesia plan  Airway & Oxygen Therapy: Patient placed on Ventilator (see vital sign flow sheet for setting)  Post-op Assessment: Report given to RN and Post -op Vital signs reviewed and stable  Post vital signs: Reviewed and stable  Last Vitals:  Vitals Value Taken Time  BP    Temp    Pulse    Resp    SpO2      Last Pain:  Vitals:   04/15/18 0107  TempSrc:   PainSc: 10-Worst pain ever         Complications: No apparent anesthesia complications

## 2018-04-15 NOTE — Anesthesia Procedure Notes (Addendum)
Procedure Name: Intubation Date/Time: 04/15/2018 1:33 AM Performed by: Molli Hazard, CRNA Pre-anesthesia Checklist: Patient identified, Emergency Drugs available, Suction available and Patient being monitored Patient Re-evaluated:Patient Re-evaluated prior to induction Oxygen Delivery Method: Circle System Utilized Preoxygenation: Pre-oxygenation with 100% oxygen Induction Type: IV induction Laryngoscope Size: Miller and 2 Grade View: Grade I Tube type: Subglottic suction tube Tube size: 7.5 mm Number of attempts: 1 Airway Equipment and Method: Stylet and Oral airway Placement Confirmation: ETT inserted through vocal cords under direct vision,  positive ETCO2 and breath sounds checked- equal and bilateral Tube secured with: Tape Dental Injury: Teeth and Oropharynx as per pre-operative assessment

## 2018-04-15 NOTE — Progress Notes (Signed)
Patient ID: Peter Golden, male   DOB: 04-17-1981, 37 y.o.   MRN: [id number]   On vent Stable post op Urine hematuria appears to be clearing Post op hemoglobin 12 CXR without pneumothorax  Plan: Continue vent  Repeat labs later today Consult urology for kidney injury

## 2018-04-15 NOTE — Progress Notes (Signed)
Patient transported on ventilator to CT and back to 4N22 with no complications. Vitals stable.
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
## 2018-04-15 NOTE — Consult Note (Signed)
WOC Nurse ostomy consult note Colostomy surgery performed early this am. Stoma type/location: Stoma red and viable, above skin level, when visualized through pouch which is intact with good seal Output: Small amt bloody drainage, no stool or flatus  Ostomy pouching: 2pc.  Education provided: Pt is critically ill and on the ventilator.  No family present at the bedside.  Will perform pouch change tomorrow. Supplies left at the bedside for staff nurse use. Enrolled patient in [facility] DC program: No Cammie Mcgee MSN, RN, [place], [place], [place] [phone]
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
## 2018-04-15 NOTE — Anesthesia Preprocedure Evaluation (Signed)
Anesthesia Evaluation  Patient identified by MRN, date of birth, ID band Patient awake  General Assessment Comment:Emergent surgery on [place] speaking patient. CG  Airway Mallampati: II  TM Distance: >3 FB     Dental   Pulmonary    breath sounds clear to auscultation       Cardiovascular  Rhythm:Regular Rate:Tachycardia     Neuro/Psych    GI/Hepatic   Endo/Other    Renal/GU      Musculoskeletal   Abdominal   Peds  Hematology   Anesthesia Other Findings   Reproductive/Obstetrics                             Anesthesia Physical Anesthesia Plan  ASA: I and emergent  Anesthesia Plan: General   Post-op Pain Management:    Induction: Intravenous  PONV Risk Score and Plan: 2 and Treatment may vary due to age or medical condition, Ondansetron, Dexamethasone and Midazolam  Airway Management Planned: Oral ETT  Additional Equipment:   Intra-op Plan:   Post-operative Plan: Possible Post-op intubation/ventilation  Informed Consent:   Plan Discussed with: CRNA, Anesthesiologist and Surgeon  Anesthesia Plan Comments:         Anesthesia Quick Evaluation
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
## 2018-04-15 NOTE — Progress Notes (Signed)
Patient's valuables taken down to security. Paperwork in the paper chart. Donaciano Range, [place] Scrape, RN

## 2018-04-15 NOTE — Anesthesia Postprocedure Evaluation (Signed)
Anesthesia Post Note  Patient: Peter Golden  Procedure(s) Performed: EXPLORATORY LAPAROTOMY with splenectomy, transverse colectomy, partial gastrectomy, repair of diaphragmatic hernia, left 14 Fr pigtail tube thoracostomy, repair of umbilical hernia (N/A Abdomen)     Patient location during evaluation: SICU Anesthesia Type: General Level of consciousness: patient remains intubated per anesthesia plan Pain management: pain level controlled Vital Signs Assessment: post-procedure vital signs reviewed and stable Respiratory status: patient remains intubated per anesthesia plan Cardiovascular status: stable Anesthetic complications: no    Last Vitals:  Vitals:   04/15/18 0800 04/15/18 0805  BP: 111/76 113/73  Pulse: (!) 109 (!) 109  Resp: 16 16  Temp:    SpO2: 100% 100%    Last Pain:  Vitals:   04/15/18 0400  TempSrc: Rectal  PainSc:                  Siah Kannan
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
## 2018-04-15 NOTE — Anesthesia Procedure Notes (Signed)
Arterial Line Insertion Start/End10/16/2019 1:40 AM, 04/15/2018 1:47 AM Performed by: Molli Hazard, CRNA, CRNA  Patient location: OR. Preanesthetic checklist: patient identified, IV checked, site marked, risks and benefits discussed, surgical consent, monitors and equipment checked, pre-op evaluation, timeout performed and anesthesia consent Right, radial was placed Catheter size: 20 G Hand hygiene performed  and maximum sterile barriers used   Attempts: 1 Procedure performed without using ultrasound guided technique. Following insertion, dressing applied and Biopatch. Post procedure assessment: normal  Patient tolerated the procedure well with no immediate complications.

## 2018-04-16 ENCOUNTER — Encounter (HOSPITAL_COMMUNITY): Payer: Self-pay | Admitting: General Surgery

## 2018-04-16 ENCOUNTER — Inpatient Hospital Stay (HOSPITAL_COMMUNITY): Payer: Worker's Compensation

## 2018-04-16 LAB — BASIC METABOLIC PANEL
ANION GAP: 5 (ref 5–15)
BUN: 7 mg/dL (ref 6–20)
CALCIUM: 8 mg/dL — AB (ref 8.9–10.3)
CO2: 24 mmol/L (ref 22–32)
Chloride: 106 mmol/L (ref 98–111)
Creatinine, Ser: 1.12 mg/dL (ref 0.61–1.24)
Glucose, Bld: 116 mg/dL — ABNORMAL HIGH (ref 70–99)
POTASSIUM: 3.5 mmol/L (ref 3.5–5.1)
SODIUM: 135 mmol/L (ref 135–145)

## 2018-04-16 LAB — CBC
HCT: 35.1 % — ABNORMAL LOW (ref 39.0–52.0)
Hemoglobin: 11.9 g/dL — ABNORMAL LOW (ref 13.0–17.0)
MCH: 31.2 pg (ref 26.0–34.0)
MCHC: 33.9 g/dL (ref 30.0–36.0)
MCV: 91.9 fL (ref 80.0–100.0)
NRBC: 0 % (ref 0.0–0.2)
PLATELETS: 137 10*3/uL — AB (ref 150–400)
RBC: 3.82 MIL/uL — AB (ref 4.22–5.81)
RDW: 13.2 % (ref 11.5–15.5)
WBC: 10.9 10*3/uL — AB (ref 4.0–10.5)

## 2018-04-16 LAB — BPAM FFP
Blood Product Expiration Date: 201911052359
Blood Product Expiration Date: 201911052359
ISSUE DATE / TIME: 201910160057
ISSUE DATE / TIME: 201910160057
UNIT TYPE AND RH: 6200
Unit Type and Rh: 6200

## 2018-04-16 LAB — PREPARE FRESH FROZEN PLASMA
UNIT DIVISION: 0
Unit division: 0

## 2018-04-16 LAB — LACTIC ACID, PLASMA: LACTIC ACID, VENOUS: 0.9 mmol/L (ref 0.5–1.9)

## 2018-04-16 MED ORDER — METHOCARBAMOL 500 MG PO TABS: 500.0000 mg | ORAL_TABLET | Freq: Three times a day (TID) | ORAL | Status: DC

## 2018-04-16 MED ORDER — HYDROMORPHONE 1 MG/ML IV SOLN
INTRAVENOUS | Status: DC
  Administered 2018-04-16: 17:00:00 via INTRAVENOUS
  Administered 2018-04-16: 1.2 mg via INTRAVENOUS
  Administered 2018-04-17: 1.5 mg via INTRAVENOUS
  Administered 2018-04-17: 1.2 mg via INTRAVENOUS
  Administered 2018-04-17 (×2): 1.5 mg via INTRAVENOUS
  Administered 2018-04-17: 1.2 mg via INTRAVENOUS
  Administered 2018-04-17: 1.5 mg via INTRAVENOUS
  Administered 2018-04-18: 0 mg via INTRAVENOUS
  Administered 2018-04-18: 1 mg via INTRAVENOUS
  Administered 2018-04-18: 0.6 mg via INTRAVENOUS
  Administered 2018-04-18 (×2): 0 mg via INTRAVENOUS
  Administered 2018-04-19: 0.3 mg via INTRAVENOUS
  Administered 2018-04-19: 25 mg via INTRAVENOUS
  Administered 2018-04-19: 0.6 mg via INTRAVENOUS
  Administered 2018-04-19: 0 mg via INTRAVENOUS
  Administered 2018-04-19 (×2): 0.3 mg via INTRAVENOUS
  Administered 2018-04-20: 0 mg via INTRAVENOUS
  Administered 2018-04-20: 0.3 mg via INTRAVENOUS
  Filled 2018-04-16 (×4): qty 25

## 2018-04-16 MED ORDER — SODIUM CHLORIDE 0.9 % IV SOLN
500.0000 mL | Freq: Once | INTRAVENOUS | Status: AC
  Administered 2018-04-16: 500 mL via INTRAVENOUS

## 2018-04-16 MED ORDER — ALBUMIN HUMAN 5 % IV SOLN
25.0000 g | Freq: Once | INTRAVENOUS | Status: AC
  Administered 2018-04-16: 25 g via INTRAVENOUS

## 2018-04-16 MED ORDER — ALBUMIN HUMAN 5 % IV SOLN
INTRAVENOUS | Status: AC
  Filled 2018-04-16: qty 250

## 2018-04-16 MED ORDER — DIPHENHYDRAMINE HCL 50 MG/ML IJ SOLN: 12.5000 mg | Freq: Four times a day (QID) | INTRAMUSCULAR | Status: DC | PRN

## 2018-04-16 MED ORDER — ORAL CARE MOUTH RINSE
15.0000 mL | Freq: Two times a day (BID) | OROMUCOSAL | Status: DC
  Administered 2018-04-17 – 2018-04-26 (×10): 15 mL via OROMUCOSAL

## 2018-04-16 MED ORDER — OXYCODONE HCL 5 MG PO TABS: 5.0000 mg | ORAL_TABLET | ORAL | Status: DC | PRN

## 2018-04-16 MED ORDER — HYDROMORPHONE HCL 1 MG/ML IJ SOLN: 1.0000 mg | INTRAMUSCULAR | Status: DC | PRN

## 2018-04-16 MED ORDER — ACETAMINOPHEN 650 MG RE SUPP: 650.0000 mg | RECTAL | Status: DC | PRN

## 2018-04-16 MED ORDER — ALBUMIN HUMAN 5 % IV SOLN
INTRAVENOUS | Status: AC
  Administered 2018-04-16: 25 g via INTRAVENOUS
  Filled 2018-04-16: qty 250

## 2018-04-16 MED ORDER — NALOXONE HCL 0.4 MG/ML IJ SOLN: 0.4000 mg | INTRAMUSCULAR | Status: DC | PRN

## 2018-04-16 MED ORDER — DIPHENHYDRAMINE HCL 12.5 MG/5ML PO ELIX: 12.5000 mg | ORAL_SOLUTION | Freq: Four times a day (QID) | ORAL | Status: DC | PRN

## 2018-04-16 MED ORDER — SODIUM CHLORIDE 0.9% FLUSH: 9.0000 mL | INTRAVENOUS | Status: DC | PRN

## 2018-04-16 MED ORDER — ACETAMINOPHEN 500 MG PO TABS: 1000.0000 mg | ORAL_TABLET | Freq: Three times a day (TID) | ORAL | Status: DC

## 2018-04-16 MED ORDER — METOPROLOL TARTRATE 5 MG/5ML IV SOLN
5.0000 mg | Freq: Four times a day (QID) | INTRAVENOUS | Status: DC | PRN
  Filled 2018-04-16: qty 5

## 2018-04-16 MED ORDER — CHLORHEXIDINE GLUCONATE 0.12 % MT SOLN
15.0000 mL | Freq: Two times a day (BID) | OROMUCOSAL | Status: DC
  Administered 2018-04-16 – 2018-04-29 (×21): 15 mL via OROMUCOSAL
  Filled 2018-04-16 (×20): qty 15

## 2018-04-16 MED ORDER — METHOCARBAMOL 1000 MG/10ML IJ SOLN
500.0000 mg | Freq: Three times a day (TID) | INTRAVENOUS | Status: DC
  Administered 2018-04-16 – 2018-04-19 (×10): 500 mg via INTRAVENOUS
  Filled 2018-04-16 (×4): qty 5
  Filled 2018-04-16: qty 500
  Filled 2018-04-16 (×6): qty 5
  Filled 2018-04-16 (×2): qty 500
  Filled 2018-04-16: qty 5

## 2018-04-16 MED ORDER — ONDANSETRON HCL 4 MG/2ML IJ SOLN
4.0000 mg | Freq: Four times a day (QID) | INTRAMUSCULAR | Status: DC | PRN
  Administered 2018-04-18 – 2018-04-19 (×2): 4 mg via INTRAVENOUS
  Filled 2018-04-16: qty 2
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
## 2018-04-16 NOTE — Procedures (Signed)
Extubation Procedure Note  Patient Details:   Name: Daltyn Degroat DOB: 1981/04/07 MRN: [id number]   Airway Documentation:    Vent end date: 04/16/18 Vent end time: 1220   Evaluation  O2 sats: stable throughout Complications: No apparent complications Patient did tolerate procedure well. Bilateral Breath Sounds: Rhonchi   Yes   Patient was extubated to a 3L [place] without any complications, dyspnea or stridor noted.   Hershel Corkery, Margaretmary Dys 04/16/2018, 12:20 PM

## 2018-04-16 NOTE — Consult Note (Addendum)
WOC Nurse ostomy consult note Stoma type/location: Colostomy surgery performed on 10/16 Stomal assessment/size: Stoma is edematous at the top, larger than 3 inches, but 1 3/4 inches at the base when wafer pattern cut out for use. Peristomal assessment: Intact skin surrounding Output: small amt bloody drainage, no stool or flatus  Ostomy pouching: 2pc.  Education provided: Pt is intubated and there are no family members present.  Demonstrated pouch change procedure and showed patient the stoma appearance using a mirror. Applied 2 piece pouching system and supplies ordered to the room for staff nurse use.  WOC team will follow-up next week for teaching sessions when he is awake and stable. Enrolled patient in [facility] DC program: No Cammie Mcgee MSN, RN, [place], [place], [place] [phone]

## 2018-04-17 ENCOUNTER — Inpatient Hospital Stay (HOSPITAL_COMMUNITY): Payer: Worker's Compensation

## 2018-04-17 LAB — BASIC METABOLIC PANEL
Anion gap: 7 (ref 5–15)
CHLORIDE: 105 mmol/L (ref 98–111)
CO2: 26 mmol/L (ref 22–32)
Calcium: 8.7 mg/dL — ABNORMAL LOW (ref 8.9–10.3)
Creatinine, Ser: 1 mg/dL (ref 0.61–1.24)
GFR calc Af Amer: >60 mL/min (ref >60)
GFR calc non Af Amer: >60 mL/min (ref >60)
Glucose, Bld: 118 mg/dL — ABNORMAL HIGH (ref 70–99)
POTASSIUM: 3.8 mmol/L (ref 3.5–5.1)
Sodium: 138 mmol/L (ref 135–145)

## 2018-04-17 LAB — CBC
HEMATOCRIT: 31.9 % — AB (ref 39.0–52.0)
HEMOGLOBIN: 10.2 g/dL — AB (ref 13.0–17.0)
MCH: 30.4 pg (ref 26.0–34.0)
MCHC: 32 g/dL (ref 30.0–36.0)
MCV: 95.2 fL (ref 80.0–100.0)
Platelets: 121 10*3/uL — ABNORMAL LOW (ref 150–400)
RBC: 3.35 MIL/uL — AB (ref 4.22–5.81)
RDW: 13.2 % (ref 11.5–15.5)
WBC: 13.5 10*3/uL — ABNORMAL HIGH (ref 4.0–10.5)
nRBC: 0 % (ref 0.0–0.2)

## 2018-04-17 LAB — GLUCOSE, CAPILLARY: Glucose-Capillary: 104 mg/dL — ABNORMAL HIGH (ref 70–99)

## 2018-04-17 MED ORDER — VITAL HIGH PROTEIN PO LIQD
1000.0000 mL | ORAL | Status: DC
  Administered 2018-04-17: 1000 mL

## 2018-04-17 MED ORDER — ACETAMINOPHEN 650 MG RE SUPP
650.0000 mg | RECTAL | Status: DC | PRN
  Administered 2018-04-20: 650 mg via RECTAL
  Filled 2018-04-17: qty 1

## 2018-04-17 MED ORDER — IOHEXOL 350 MG/ML SOLN
300.0000 mL | Freq: Once | INTRAVENOUS | Status: AC | PRN
  Administered 2018-04-17: 160 mL

## 2018-04-17 MED ORDER — WHITE PETROLATUM EX OINT
TOPICAL_OINTMENT | CUTANEOUS | Status: AC
  Administered 2018-04-17: 21:00:00
  Filled 2018-04-17: qty 28.35
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
## 2018-04-17 NOTE — Progress Notes (Signed)
Urine draining well and lighter Explained issue to patient Will follow and reimage kidney as outpatient Will follow in hospital
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
## 2018-04-17 NOTE — Progress Notes (Signed)
Chaplain responded to page from floor. Pt needed prayer. Chaplain spoke with pt.  Learned when he said he wanted a "Blessing" that he was referring to a friend who is named Horticulturist, commercial, from [place].  Chaplain sent message to Ms. Daku from [facility] and is trying to contact others associated with her church to reach her.  Will follow up. Lynnell Chad  [phone] pager.
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
## 2018-04-18 ENCOUNTER — Inpatient Hospital Stay (HOSPITAL_COMMUNITY): Payer: Worker's Compensation

## 2018-04-18 LAB — BASIC METABOLIC PANEL
Anion gap: 8 (ref 5–15)
BUN: <5 mg/dL — ABNORMAL LOW (ref 6–20)
CO2: 28 mmol/L (ref 22–32)
Calcium: 8.8 mg/dL — ABNORMAL LOW (ref 8.9–10.3)
Chloride: 101 mmol/L (ref 98–111)
Creatinine, Ser: 0.94 mg/dL (ref 0.61–1.24)
GFR calc Af Amer: >60 mL/min (ref >60)
GFR calc non Af Amer: >60 mL/min (ref >60)
Glucose, Bld: 133 mg/dL — ABNORMAL HIGH (ref 70–99)
Potassium: 3.6 mmol/L (ref 3.5–5.1)
Sodium: 137 mmol/L (ref 135–145)

## 2018-04-18 LAB — GLUCOSE, CAPILLARY
GLUCOSE-CAPILLARY: 120 mg/dL — AB (ref 70–99)
Glucose-Capillary: 118 mg/dL — ABNORMAL HIGH (ref 70–99)
Glucose-Capillary: 120 mg/dL — ABNORMAL HIGH (ref 70–99)
Glucose-Capillary: 118 mg/dL — ABNORMAL HIGH (ref 70–99)
Glucose-Capillary: 107 mg/dL — ABNORMAL HIGH (ref 70–99)

## 2018-04-18 LAB — CBC
HCT: 32 % — ABNORMAL LOW (ref 39.0–52.0)
Hemoglobin: 10.5 g/dL — ABNORMAL LOW (ref 13.0–17.0)
MCH: 30.7 pg (ref 26.0–34.0)
MCHC: 32.8 g/dL (ref 30.0–36.0)
MCV: 93.6 fL (ref 80.0–100.0)
Platelets: 169 10*3/uL (ref 150–400)
RBC: 3.42 MIL/uL — AB (ref 4.22–5.81)
RDW: 12.7 % (ref 11.5–15.5)
WBC: 12 10*3/uL — ABNORMAL HIGH (ref 4.0–10.5)
nRBC: 0 % (ref 0.0–0.2)

## 2018-04-18 LAB — MAGNESIUM: Magnesium: 1.8 mg/dL (ref 1.7–2.4)

## 2018-04-18 LAB — CREATININE, FLUID (PLEURAL, PERITONEAL, JP DRAINAGE): Creat, Fluid: 1.1 mg/dL

## 2018-04-18 LAB — AMYLASE: AMYLASE: 26 U/L — AB (ref 28–100)
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
## 2018-04-19 LAB — TYPE AND SCREEN
ABO/RH(D): A POS
Antibody Screen: NEGATIVE
UNIT DIVISION: 0
UNIT DIVISION: 0
UNIT DIVISION: 0
Unit division: 0
Unit division: 0
Unit division: 0
Unit division: 0
Unit division: 0
Unit division: 0
Unit division: 0

## 2018-04-19 LAB — BPAM RBC
BLOOD PRODUCT EXPIRATION DATE: 201911022359
BLOOD PRODUCT EXPIRATION DATE: 201911032359
BLOOD PRODUCT EXPIRATION DATE: 201911042359
BLOOD PRODUCT EXPIRATION DATE: 201911052359
BLOOD PRODUCT EXPIRATION DATE: 201911052359
BLOOD PRODUCT EXPIRATION DATE: 201911112359
BLOOD PRODUCT EXPIRATION DATE: 201911152359
Blood Product Expiration Date: 201911022359
Blood Product Expiration Date: 201911052359
Blood Product Expiration Date: 201911052359
ISSUE DATE / TIME: 201910160057
ISSUE DATE / TIME: 201910160057
ISSUE DATE / TIME: 201910160207
ISSUE DATE / TIME: 201910160207
ISSUE DATE / TIME: 201910160207
ISSUE DATE / TIME: 201910160207
ISSUE DATE / TIME: 201910161459
ISSUE DATE / TIME: 201910161459
ISSUE DATE / TIME: 201910170910
ISSUE DATE / TIME: 201910170910
UNIT TYPE AND RH: 6200
UNIT TYPE AND RH: 6200
UNIT TYPE AND RH: 6200
UNIT TYPE AND RH: 6200
UNIT TYPE AND RH: 6200
UNIT TYPE AND RH: 9500
Unit Type and Rh: 6200
Unit Type and Rh: 6200
Unit Type and Rh: 6200
Unit Type and Rh: 9500

## 2018-04-19 LAB — CBC
HCT: 31.7 % — ABNORMAL LOW (ref 39.0–52.0)
HEMOGLOBIN: 10.8 g/dL — AB (ref 13.0–17.0)
MCH: 31.1 pg (ref 26.0–34.0)
MCHC: 34.1 g/dL (ref 30.0–36.0)
MCV: 91.4 fL (ref 80.0–100.0)
PLATELETS: 246 10*3/uL (ref 150–400)
RBC: 3.47 MIL/uL — AB (ref 4.22–5.81)
RDW: 12.8 % (ref 11.5–15.5)
WBC: 10 10*3/uL (ref 4.0–10.5)
nRBC: 0 % (ref 0.0–0.2)

## 2018-04-19 LAB — GLUCOSE, CAPILLARY
GLUCOSE-CAPILLARY: 121 mg/dL — AB (ref 70–99)
Glucose-Capillary: 129 mg/dL — ABNORMAL HIGH (ref 70–99)
Glucose-Capillary: 113 mg/dL — ABNORMAL HIGH (ref 70–99)
Glucose-Capillary: 92 mg/dL (ref 70–99)
Glucose-Capillary: 100 mg/dL — ABNORMAL HIGH (ref 70–99)
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
## 2018-04-20 ENCOUNTER — Inpatient Hospital Stay: Payer: Self-pay

## 2018-04-20 ENCOUNTER — Inpatient Hospital Stay (HOSPITAL_COMMUNITY): Payer: Worker's Compensation

## 2018-04-20 LAB — COMPREHENSIVE METABOLIC PANEL
ALBUMIN: 3 g/dL — AB (ref 3.5–5.0)
ALT: 15 U/L (ref 0–44)
AST: 27 U/L (ref 15–41)
Alkaline Phosphatase: 42 U/L (ref 38–126)
Anion gap: 9 (ref 5–15)
BUN: 5 mg/dL — AB (ref 6–20)
CO2: 27 mmol/L (ref 22–32)
Calcium: 8.7 mg/dL — ABNORMAL LOW (ref 8.9–10.3)
Chloride: 102 mmol/L (ref 98–111)
Creatinine, Ser: 0.85 mg/dL (ref 0.61–1.24)
GFR calc Af Amer: >60 mL/min (ref >60)
Glucose, Bld: 118 mg/dL — ABNORMAL HIGH (ref 70–99)
POTASSIUM: 3.1 mmol/L — AB (ref 3.5–5.1)
Sodium: 138 mmol/L (ref 135–145)
Total Bilirubin: 2.4 mg/dL — ABNORMAL HIGH (ref 0.3–1.2)
Total Protein: 6.2 g/dL — ABNORMAL LOW (ref 6.5–8.1)

## 2018-04-20 LAB — CBC
HCT: 32.1 % — ABNORMAL LOW (ref 39.0–52.0)
HEMOGLOBIN: 10.7 g/dL — AB (ref 13.0–17.0)
MCH: 30.6 pg (ref 26.0–34.0)
MCHC: 33.3 g/dL (ref 30.0–36.0)
MCV: 91.7 fL (ref 80.0–100.0)
Platelets: 295 10*3/uL (ref 150–400)
RBC: 3.5 MIL/uL — AB (ref 4.22–5.81)
RDW: 12.8 % (ref 11.5–15.5)
WBC: 11.5 10*3/uL — AB (ref 4.0–10.5)
nRBC: 0.2 % (ref 0.0–0.2)

## 2018-04-20 LAB — GLUCOSE, CAPILLARY
GLUCOSE-CAPILLARY: 115 mg/dL — AB (ref 70–99)
GLUCOSE-CAPILLARY: 120 mg/dL — AB (ref 70–99)
Glucose-Capillary: 109 mg/dL — ABNORMAL HIGH (ref 70–99)
Glucose-Capillary: 86 mg/dL (ref 70–99)
Glucose-Capillary: 118 mg/dL — ABNORMAL HIGH (ref 70–99)
Glucose-Capillary: 75 mg/dL (ref 70–99)

## 2018-04-20 MED ORDER — ACETAMINOPHEN 325 MG PO TABS
650.0000 mg | ORAL_TABLET | Freq: Four times a day (QID) | ORAL | Status: DC | PRN
  Administered 2018-04-21 – 2018-04-26 (×6): 650 mg via ORAL
  Filled 2018-04-20 (×6): qty 2

## 2018-04-20 MED ORDER — METHOCARBAMOL 1000 MG/10ML IJ SOLN
500.0000 mg | Freq: Three times a day (TID) | INTRAVENOUS | Status: DC | PRN
  Filled 2018-04-20: qty 5

## 2018-04-20 MED ORDER — POTASSIUM CHLORIDE CRYS ER 20 MEQ PO TBCR
30.0000 meq | EXTENDED_RELEASE_TABLET | Freq: Two times a day (BID) | ORAL | Status: AC
  Administered 2018-04-20 – 2018-04-21 (×4): 30 meq via ORAL
  Filled 2018-04-20 (×3): qty 1

## 2018-04-20 MED ORDER — SODIUM CHLORIDE 0.9 % IV SOLN
3.0000 g | Freq: Four times a day (QID) | INTRAVENOUS | Status: DC
  Administered 2018-04-20 – 2018-04-28 (×30): 3 g via INTRAVENOUS
  Filled 2018-04-20 (×35): qty 3

## 2018-04-20 MED ORDER — TRAMADOL HCL 50 MG PO TABS
50.0000 mg | ORAL_TABLET | Freq: Four times a day (QID) | ORAL | Status: DC | PRN
  Administered 2018-04-20 – 2018-04-22 (×3): 50 mg via ORAL
  Filled 2018-04-20 (×3): qty 1

## 2018-04-20 MED ORDER — PANTOPRAZOLE SODIUM 40 MG IV SOLR
40.0000 mg | Freq: Two times a day (BID) | INTRAVENOUS | Status: DC
  Administered 2018-04-20 – 2018-04-21 (×4): 40 mg via INTRAVENOUS
  Filled 2018-04-20 (×5): qty 40

## 2018-04-20 MED ORDER — POTASSIUM CHLORIDE CRYS ER 20 MEQ PO TBCR: 30.0000 meq | EXTENDED_RELEASE_TABLET | Freq: Two times a day (BID) | ORAL | Status: DC

## 2018-04-20 NOTE — Progress Notes (Signed)
PCA dilaudid d/cd this morning. Chest tube removed by PA. Pt pulled his NGT, PA aware and will monitor nausea. Foley to remain in place until instructed to remove

## 2018-04-20 NOTE — Consult Note (Signed)
WOC Nurse ostomy follow up Stoma type/location: LUQ, transverse colostomy Stomal assessment/size: 2 1/2" budded, edematous, pink and moist Peristomal assessment: intact  Treatment options for stomal/peristomal skin: NA Output liquid brown/green Ostomy pouching: 2pc.  Education provided:  Using [place] interpreter services via [employer] interpreter machine/software Explained role of ostomy nurse and creation of stoma  Explained stoma characteristics (budded, flush, color, texture, care) Demonstrated pouch change (cutting new skin barrier, measuring stoma, cleaning peristomal skin and stoma) Education on emptying when 1/3 to 1/2 full and how to empty Demonstrated "burping" flatus from pouch Demonstrated use of wick to clean spout  Discussed bathing, diet, gas. Discussed risk of peristomal hernia Answered patient/family questions:  Patient asked about frequency of pouch changes and frequency of emptying. Patient and I discussed insurance coverage, patient reports he has insurance "through school, [employer]".   Will attempt to find educational materials from [place]  Enrolled patient in [place] Secure [employer] Discharge program: Yes Patient reported updated address: [address] [place], [place] [address] and updated phone # [phone]  WOC Nurse will follow along with you for continued support with ostomy teaching and care [name] MSN, RN, [place], CNS, [place] [phone]
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
## 2018-04-20 NOTE — Plan of Care (Signed)
  Problem: Education: Goal: Knowledge of General Education information will improve Description: Including pain rating scale, medication(s)/side effects and non-pharmacologic comfort measures Outcome: Progressing   Problem: Coping: Goal: Level of anxiety will decrease Outcome: Progressing   Problem: Pain Managment: Goal: General experience of comfort will improve Outcome: Progressing   
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
## 2018-04-20 NOTE — Progress Notes (Signed)
21mg  iv dilaudid wasted from PCA with Delia Heady

## 2018-04-21 ENCOUNTER — Inpatient Hospital Stay (HOSPITAL_COMMUNITY): Payer: Worker's Compensation

## 2018-04-21 LAB — GLUCOSE, CAPILLARY
GLUCOSE-CAPILLARY: 101 mg/dL — AB (ref 70–99)
GLUCOSE-CAPILLARY: 80 mg/dL (ref 70–99)
Glucose-Capillary: 102 mg/dL — ABNORMAL HIGH (ref 70–99)
Glucose-Capillary: 114 mg/dL — ABNORMAL HIGH (ref 70–99)
Glucose-Capillary: 117 mg/dL — ABNORMAL HIGH (ref 70–99)
Glucose-Capillary: 136 mg/dL — ABNORMAL HIGH (ref 70–99)

## 2018-04-21 LAB — BASIC METABOLIC PANEL
Anion gap: 11 (ref 5–15)
BUN: 5 mg/dL — AB (ref 6–20)
CHLORIDE: 101 mmol/L (ref 98–111)
CO2: 24 mmol/L (ref 22–32)
Calcium: 8.6 mg/dL — ABNORMAL LOW (ref 8.9–10.3)
Creatinine, Ser: 0.94 mg/dL (ref 0.61–1.24)
GFR calc Af Amer: >60 mL/min (ref >60)
GLUCOSE: 84 mg/dL (ref 70–99)
POTASSIUM: 3.3 mmol/L — AB (ref 3.5–5.1)
Sodium: 136 mmol/L (ref 135–145)

## 2018-04-21 LAB — CBC
HEMATOCRIT: 30.2 % — AB (ref 39.0–52.0)
HEMOGLOBIN: 10.2 g/dL — AB (ref 13.0–17.0)
MCH: 30.9 pg (ref 26.0–34.0)
MCHC: 33.8 g/dL (ref 30.0–36.0)
MCV: 91.5 fL (ref 80.0–100.0)
NRBC: 0.4 % — AB (ref 0.0–0.2)
PLATELETS: 350 10*3/uL (ref 150–400)
RBC: 3.3 MIL/uL — AB (ref 4.22–5.81)
RDW: 13 % (ref 11.5–15.5)
WBC: 12.1 10*3/uL — ABNORMAL HIGH (ref 4.0–10.5)

## 2018-04-21 MED ORDER — KCL IN DEXTROSE-NACL 40-5-0.45 MEQ/L-%-% IV SOLN
INTRAVENOUS | Status: DC
  Administered 2018-04-21 – 2018-04-25 (×4): via INTRAVENOUS
  Filled 2018-04-21 (×6): qty 1000

## 2018-04-21 MED ORDER — ENOXAPARIN SODIUM 40 MG/0.4ML ~~LOC~~ SOLN
40.0000 mg | SUBCUTANEOUS | Status: DC
  Administered 2018-04-21 – 2018-04-28 (×3): 40 mg via SUBCUTANEOUS
  Filled 2018-04-21 (×8): qty 0.4

## 2018-04-21 MED ORDER — METHOCARBAMOL 500 MG PO TABS
500.0000 mg | ORAL_TABLET | Freq: Three times a day (TID) | ORAL | Status: DC | PRN
  Filled 2018-04-21: qty 1

## 2018-04-21 MED ORDER — BOOST / RESOURCE BREEZE PO LIQD CUSTOM
1.0000 | Freq: Three times a day (TID) | ORAL | Status: DC
  Administered 2018-04-21 (×2): 1 via ORAL

## 2018-04-21 MED ORDER — ADULT MULTIVITAMIN W/MINERALS CH
1.0000 | ORAL_TABLET | Freq: Every day | ORAL | Status: DC
  Administered 2018-04-21 – 2018-04-29 (×9): 1 via ORAL
  Filled 2018-04-21 (×9): qty 1

## 2018-04-21 MED ORDER — IOHEXOL 300 MG/ML  SOLN
75.0000 mL | Freq: Once | INTRAMUSCULAR | Status: AC | PRN
  Administered 2018-04-21: 75 mL via INTRAVENOUS
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
## 2018-04-21 NOTE — Progress Notes (Signed)
Patient ID: Peter Golden, male   DOB: 11-27-1980, 37 y.o.   MRN: [id number] CT chest reviewed and shows moderate L effusion. L chest tube was removed yesterday so may be loculated. Will ask IR for U/S guided thoracentesis. Patient on RA with no SOB.  Violeta Gelinas, MD, MPH, FACS Trauma: [phone] General Surgery: [phone]

## 2018-04-21 NOTE — Progress Notes (Signed)
Assumed care of patient. Report given via recording. No acute problems noted. Will continue to monitor. 
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
## 2018-04-22 ENCOUNTER — Inpatient Hospital Stay (HOSPITAL_COMMUNITY): Payer: Worker's Compensation

## 2018-04-22 ENCOUNTER — Encounter (HOSPITAL_COMMUNITY): Payer: Self-pay | Admitting: Physician Assistant

## 2018-04-22 HISTORY — PX: IR THORACENTESIS ASP PLEURAL SPACE W/IMG GUIDE: IMG5380

## 2018-04-22 LAB — COMPREHENSIVE METABOLIC PANEL
ALT: 49 U/L — AB (ref 0–44)
AST: 104 U/L — AB (ref 15–41)
Albumin: 2.8 g/dL — ABNORMAL LOW (ref 3.5–5.0)
Alkaline Phosphatase: 65 U/L (ref 38–126)
Anion gap: 8 (ref 5–15)
BUN: 5 mg/dL — AB (ref 6–20)
CO2: 22 mmol/L (ref 22–32)
Calcium: 8.5 mg/dL — ABNORMAL LOW (ref 8.9–10.3)
Chloride: 107 mmol/L (ref 98–111)
Creatinine, Ser: 0.86 mg/dL (ref 0.61–1.24)
Glucose, Bld: 112 mg/dL — ABNORMAL HIGH (ref 70–99)
POTASSIUM: 4 mmol/L (ref 3.5–5.1)
Sodium: 137 mmol/L (ref 135–145)
TOTAL PROTEIN: 5.9 g/dL — AB (ref 6.5–8.1)
Total Bilirubin: 1.9 mg/dL — ABNORMAL HIGH (ref 0.3–1.2)

## 2018-04-22 LAB — CBC
HCT: 30 % — ABNORMAL LOW (ref 39.0–52.0)
HEMOGLOBIN: 10.4 g/dL — AB (ref 13.0–17.0)
MCH: 31.5 pg (ref 26.0–34.0)
MCHC: 34.7 g/dL (ref 30.0–36.0)
MCV: 90.9 fL (ref 80.0–100.0)
PLATELETS: 466 10*3/uL — AB (ref 150–400)
RBC: 3.3 MIL/uL — ABNORMAL LOW (ref 4.22–5.81)
RDW: 13 % (ref 11.5–15.5)
WBC: 10.6 10*3/uL — AB (ref 4.0–10.5)
nRBC: 0.8 % — ABNORMAL HIGH (ref 0.0–0.2)

## 2018-04-22 LAB — GLUCOSE, CAPILLARY
GLUCOSE-CAPILLARY: 105 mg/dL — AB (ref 70–99)
GLUCOSE-CAPILLARY: 79 mg/dL (ref 70–99)
GLUCOSE-CAPILLARY: 94 mg/dL (ref 70–99)
Glucose-Capillary: 105 mg/dL — ABNORMAL HIGH (ref 70–99)
Glucose-Capillary: 95 mg/dL (ref 70–99)

## 2018-04-22 MED ORDER — LIDOCAINE HCL (PF) 1 % IJ SOLN
INTRAMUSCULAR | Status: AC | PRN
  Administered 2018-04-22: 10 mL

## 2018-04-22 MED ORDER — ENSURE ENLIVE PO LIQD
237.0000 mL | Freq: Three times a day (TID) | ORAL | Status: DC
  Administered 2018-04-22 – 2018-04-28 (×9): 237 mL via ORAL

## 2018-04-22 MED ORDER — DOCUSATE SODIUM 100 MG PO CAPS
100.0000 mg | ORAL_CAPSULE | Freq: Two times a day (BID) | ORAL | Status: DC
  Administered 2018-04-22 – 2018-04-29 (×15): 100 mg via ORAL
  Filled 2018-04-22 (×15): qty 1

## 2018-04-22 MED ORDER — PANTOPRAZOLE SODIUM 40 MG PO TBEC
40.0000 mg | DELAYED_RELEASE_TABLET | Freq: Two times a day (BID) | ORAL | Status: DC
  Administered 2018-04-22 – 2018-04-29 (×15): 40 mg via ORAL
  Filled 2018-04-22 (×15): qty 1

## 2018-04-22 MED ORDER — LIDOCAINE HCL 1 % IJ SOLN
INTRAMUSCULAR | Status: AC
  Filled 2018-04-22: qty 20
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
## 2018-04-22 NOTE — Consult Note (Signed)
WOC Nurse ostomy follow up Stoma type/location: RUQ Stomal assessment/size: 2 1/2" budded, moist, red, edematous stoma. Peristomal assessment: intact Treatment options for stomal/peristomal skin: NA Output liquid brown effluent Ostomy pouching: 2pc. 2 3/4" Education provided: Patient asked to have pouch changed today. It was not leaking but removed anyway to provide teaching. Pt was able to cut the barrier to fit the stoma. The stoma takes up the entire barrier, no need to trace because we cut around the largest circle already marked. Patient briefly looked at the stoma and  turned his head away. He asked how much longer he would have it. Explained it is different for each person and that is a question for the surgeon. Explained that no one may know at this early point. He was able to roll and lock adequately. Practiced cleaning a new pouch with toilet paper wicks, able to perform this. Patient states he would like the whole pouch and barrier changed everyday. Showed pt how to pop off the pouch and he said he could just do that and throw it away. Explained it would have to be emptied multiple times a day and throwing it away each time would not be feasible. Discussed how insurance will pay for a certain amount of supplies, not enough to throw away a pouch multiple times a day. Patient kept referring to having a HHRN perform change. Explained that [facility] would be for teaching so he could care for it himself, not to come take care of it. Explained WOC team would be back Friday to continue teaching. Enrolled patient in [place] Secure Start Discharge program: Yes, previously. WOC team will continue to follow. Barnett Hatter, RN-C, WTA-C, OCA Wound Treatment Associate Ostomy Care Associate

## 2018-04-22 NOTE — Procedures (Signed)
PROCEDURE SUMMARY:  Successful image-guided left thoracentesis. Yielded 800 milliliters of bloody fluid. Patient tolerated procedure well. No immediate complications.  Specimen was not sent for labs.  Post procedure CXR shows no pneumothorax.  Please see imaging section in Epic for full dictation.  Villa Herb PA-C 04/22/2018 1:12 PM

## 2018-04-23 ENCOUNTER — Inpatient Hospital Stay (HOSPITAL_COMMUNITY): Payer: Worker's Compensation

## 2018-04-23 LAB — CBC
HCT: 31.8 % — ABNORMAL LOW (ref 39.0–52.0)
Hemoglobin: 10.4 g/dL — ABNORMAL LOW (ref 13.0–17.0)
MCH: 30.1 pg (ref 26.0–34.0)
MCHC: 32.7 g/dL (ref 30.0–36.0)
MCV: 92.2 fL (ref 80.0–100.0)
Platelets: 601 10*3/uL — ABNORMAL HIGH (ref 150–400)
RBC: 3.45 MIL/uL — AB (ref 4.22–5.81)
RDW: 13.2 % (ref 11.5–15.5)
WBC: 11.8 10*3/uL — ABNORMAL HIGH (ref 4.0–10.5)
nRBC: 0.8 % — ABNORMAL HIGH (ref 0.0–0.2)

## 2018-04-23 LAB — BASIC METABOLIC PANEL
Anion gap: 8 (ref 5–15)
BUN: 7 mg/dL (ref 6–20)
CHLORIDE: 105 mmol/L (ref 98–111)
CO2: 25 mmol/L (ref 22–32)
CREATININE: 0.99 mg/dL (ref 0.61–1.24)
Calcium: 8.7 mg/dL — ABNORMAL LOW (ref 8.9–10.3)
GFR calc Af Amer: >60 mL/min (ref >60)
GFR calc non Af Amer: >60 mL/min (ref >60)
GLUCOSE: 99 mg/dL (ref 70–99)
Potassium: 4.1 mmol/L (ref 3.5–5.1)
SODIUM: 138 mmol/L (ref 135–145)

## 2018-04-23 LAB — GLUCOSE, CAPILLARY
GLUCOSE-CAPILLARY: 103 mg/dL — AB (ref 70–99)
GLUCOSE-CAPILLARY: 103 mg/dL — AB (ref 70–99)
GLUCOSE-CAPILLARY: 86 mg/dL (ref 70–99)
GLUCOSE-CAPILLARY: 96 mg/dL (ref 70–99)
Glucose-Capillary: 125 mg/dL — ABNORMAL HIGH (ref 70–99)
Glucose-Capillary: 133 mg/dL — ABNORMAL HIGH (ref 70–99)

## 2018-04-23 MED ORDER — HAEMOPHILUS B POLYSAC CONJ VAC 7.5 MCG/0.5 ML IM SUSP
0.5000 mL | Freq: Once | INTRAMUSCULAR | Status: AC
  Administered 2018-04-24: 0.5 mL via INTRAMUSCULAR
  Filled 2018-04-23 (×3): qty 0.5

## 2018-04-23 MED ORDER — MENINGOCOCCAL A C Y&W-135 OLIG IM SOLR
0.5000 mL | Freq: Once | INTRAMUSCULAR | Status: AC
  Administered 2018-04-24: 0.5 mL via INTRAMUSCULAR
  Filled 2018-04-23 (×3): qty 0.5

## 2018-04-23 MED ORDER — PNEUMOCOCCAL 13-VAL CONJ VACC IM SUSP
0.5000 mL | INTRAMUSCULAR | Status: AC
  Administered 2018-04-24: 0.5 mL via INTRAMUSCULAR
  Filled 2018-04-23 (×2): qty 0.5
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
## 2018-04-24 ENCOUNTER — Encounter (HOSPITAL_COMMUNITY): Payer: Self-pay

## 2018-04-24 LAB — CBC
HCT: 33.8 % — ABNORMAL LOW (ref 39.0–52.0)
HEMOGLOBIN: 11.4 g/dL — AB (ref 13.0–17.0)
MCH: 30.7 pg (ref 26.0–34.0)
MCHC: 33.7 g/dL (ref 30.0–36.0)
MCV: 91.1 fL (ref 80.0–100.0)
PLATELETS: 714 10*3/uL — AB (ref 150–400)
RBC: 3.71 MIL/uL — AB (ref 4.22–5.81)
RDW: 13.2 % (ref 11.5–15.5)
WBC: 12.2 10*3/uL — AB (ref 4.0–10.5)
nRBC: 0.3 % — ABNORMAL HIGH (ref 0.0–0.2)

## 2018-04-24 LAB — GLUCOSE, CAPILLARY
GLUCOSE-CAPILLARY: 102 mg/dL — AB (ref 70–99)
GLUCOSE-CAPILLARY: 108 mg/dL — AB (ref 70–99)
GLUCOSE-CAPILLARY: 135 mg/dL — AB (ref 70–99)
GLUCOSE-CAPILLARY: 150 mg/dL — AB (ref 70–99)
Glucose-Capillary: 109 mg/dL — ABNORMAL HIGH (ref 70–99)

## 2018-04-24 MED ORDER — SODIUM CHLORIDE 0.9 % IV BOLUS: 1000.0000 mL | Freq: Once | INTRAVENOUS | Status: DC

## 2018-04-24 MED ORDER — SODIUM CHLORIDE 0.9 % IV SOLN
INTRAVENOUS | Status: DC | PRN
  Administered 2018-04-24 (×2): 250 mL via INTRAVENOUS
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
## 2018-04-24 NOTE — Social Work (Signed)
SBIRT completed, pt independent and states he has friends that will be able to help him at discharge. When asked about ETOH pt states that he never drinks. CSW also inquired about use of prescription medications or other pills that pt may utilize due to positive UDS. Pt denies any use of pills prescribed or otherwise. Pt understands CSW is available should he need assistance or resources prior to discharge.   CSW signing off. Please consult if any additional needs arise.  Doy Hutching, LCSWA [facility] Clinical Social Work [phone]

## 2018-04-24 NOTE — Progress Notes (Signed)
Pt's PIV in left arm was leaking this evening, new PIV started in right hand.  At 0235, flushed PIV Pt c/o pain when flushing.  I attempted to start new PIV in left wrist area, when started, Pt c/o pain at start of insertion and requested I stop and refused to have a new IV started. I requested IV team to come and look.  They came and flushed, and again Pt c/o pain in hand IV, and refused the RN to start a new IV. Pt refused [place] to allow new or start fluids or antibiotics.  Pt aware of importance of medications, and advised will address with MD in the AM.

## 2018-04-24 NOTE — Progress Notes (Signed)
Notified by pt that he has WC information:  WC is through [employer] Policy # [id number] Claim#  [id number]  Phone: [phone] Toll free:  [phone] Fax:  [phone]  Adjustor, Gretel Acre  Left message for adjustor, requesting assistance with discharge needs.  Rep states adjustor has left for the day, and will not return until Monday.  Uncertain if pt will dc over the weekend, but would recommend that pt stay until at least Monday, so that [facility] can assist and cover cost of discharge needs and hospital stay.  Will attempt to reach out to adjustor again first thing on Monday.  Quintella Baton, RN, BSN  Trauma/Neuro ICU Case Manager [phone]
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
## 2018-04-24 NOTE — Consult Note (Signed)
[place] Nurse ostomy follow up Stoma type/location: RUQ, transverse colostomy  Stomal assessment/size: 2 1/2" large budded, pink, moist stoma Peristomal assessment: NA Treatment options for stomal/peristomal skin: NA Output liquid brown/green Ostomy pouching: 2pc. 2 3/4" changed Wednesday  Education provided:  Met with patient and provided written materials in [place], patient verifies he reads [place] and did  Patient has preformed  pouch change (cutting new skin barrier, measuring stoma, cleaning peristomal skin and stoma) Patient is independent with  emptying when 1/3 to 1/2 full and how to empty Discussed bathing, diet, gas, medication use Provided patient with [employer] and marked items currently using, he will have HHRN to assist with supplies at [place] patient in [employer] Discharge program: Yes  Unclear if patient will DC over the weekend, I have placed 5 pouching sets in the patient's room for possible DC over the weekend.   [place] Nurse will follow along with you for continued support with ostomy teaching and care [place] MSN, RN, [place], [place], [place]

## 2018-04-25 ENCOUNTER — Inpatient Hospital Stay (HOSPITAL_COMMUNITY): Payer: Worker's Compensation

## 2018-04-25 ENCOUNTER — Encounter (HOSPITAL_COMMUNITY): Payer: Self-pay | Admitting: Radiology

## 2018-04-25 LAB — CBC
HEMATOCRIT: 34.9 % — AB (ref 39.0–52.0)
HEMOGLOBIN: 11.5 g/dL — AB (ref 13.0–17.0)
MCH: 30 pg (ref 26.0–34.0)
MCHC: 33 g/dL (ref 30.0–36.0)
MCV: 91.1 fL (ref 80.0–100.0)
NRBC: 0.2 % (ref 0.0–0.2)
Platelets: 856 10*3/uL — ABNORMAL HIGH (ref 150–400)
RBC: 3.83 MIL/uL — ABNORMAL LOW (ref 4.22–5.81)
RDW: 13.3 % (ref 11.5–15.5)
WBC: 15.1 10*3/uL — AB (ref 4.0–10.5)

## 2018-04-25 LAB — URINE CULTURE: Culture: NO GROWTH

## 2018-04-25 LAB — GLUCOSE, CAPILLARY
GLUCOSE-CAPILLARY: 78 mg/dL (ref 70–99)
GLUCOSE-CAPILLARY: 130 mg/dL — AB (ref 70–99)
GLUCOSE-CAPILLARY: 107 mg/dL — AB (ref 70–99)
GLUCOSE-CAPILLARY: 107 mg/dL — AB (ref 70–99)
Glucose-Capillary: 91 mg/dL (ref 70–99)
Glucose-Capillary: 91 mg/dL (ref 70–99)

## 2018-04-25 MED ORDER — IOHEXOL 300 MG/ML  SOLN
100.0000 mL | Freq: Once | INTRAMUSCULAR | Status: AC | PRN
  Administered 2018-04-25: 100 mL via INTRAVENOUS
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
## 2018-04-26 LAB — CBC WITH DIFFERENTIAL/PLATELET
Abs Immature Granulocytes: 0.23 10*3/uL — ABNORMAL HIGH (ref 0.00–0.07)
BASOS PCT: 0 %
Basophils Absolute: 0.1 10*3/uL (ref 0.0–0.1)
EOS ABS: 0.2 10*3/uL (ref 0.0–0.5)
Eosinophils Relative: 1 %
HCT: 33.4 % — ABNORMAL LOW (ref 39.0–52.0)
HEMOGLOBIN: 11.1 g/dL — AB (ref 13.0–17.0)
IMMATURE GRANULOCYTES: 1 %
Lymphocytes Relative: 10 %
Lymphs Abs: 2 10*3/uL (ref 0.7–4.0)
MCH: 30.2 pg (ref 26.0–34.0)
MCHC: 33.2 g/dL (ref 30.0–36.0)
MCV: 91 fL (ref 80.0–100.0)
MONO ABS: 1.5 10*3/uL — AB (ref 0.1–1.0)
Monocytes Relative: 7 %
NEUTROS PCT: 81 %
Neutro Abs: 16.4 10*3/uL — ABNORMAL HIGH (ref 1.7–7.7)
PLATELETS: 897 10*3/uL — AB (ref 150–400)
RBC: 3.67 MIL/uL — ABNORMAL LOW (ref 4.22–5.81)
RDW: 13.1 % (ref 11.5–15.5)
WBC: 20.5 10*3/uL — AB (ref 4.0–10.5)
nRBC: 0 % (ref 0.0–0.2)

## 2018-04-26 LAB — GLUCOSE, CAPILLARY
GLUCOSE-CAPILLARY: 98 mg/dL (ref 70–99)
GLUCOSE-CAPILLARY: 93 mg/dL (ref 70–99)
Glucose-Capillary: 98 mg/dL (ref 70–99)
Glucose-Capillary: 88 mg/dL (ref 70–99)
Glucose-Capillary: 100 mg/dL — ABNORMAL HIGH (ref 70–99)
Glucose-Capillary: 107 mg/dL — ABNORMAL HIGH (ref 70–99)
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
## 2018-04-27 LAB — CBC WITH DIFFERENTIAL/PLATELET
Abs Immature Granulocytes: 0.15 10*3/uL — ABNORMAL HIGH (ref 0.00–0.07)
Basophils Absolute: 0.1 10*3/uL (ref 0.0–0.1)
Basophils Relative: 0 %
EOS ABS: 0.3 10*3/uL (ref 0.0–0.5)
EOS PCT: 2 %
HEMATOCRIT: 32.1 % — AB (ref 39.0–52.0)
Hemoglobin: 10.7 g/dL — ABNORMAL LOW (ref 13.0–17.0)
Immature Granulocytes: 1 %
LYMPHS ABS: 2.6 10*3/uL (ref 0.7–4.0)
Lymphocytes Relative: 18 %
MCH: 30.6 pg (ref 26.0–34.0)
MCHC: 33.3 g/dL (ref 30.0–36.0)
MCV: 91.7 fL (ref 80.0–100.0)
MONOS PCT: 10 %
Monocytes Absolute: 1.5 10*3/uL — ABNORMAL HIGH (ref 0.1–1.0)
NRBC: 0 % (ref 0.0–0.2)
Neutro Abs: 9.8 10*3/uL — ABNORMAL HIGH (ref 1.7–7.7)
Neutrophils Relative %: 69 %
Platelets: 937 10*3/uL (ref 150–400)
RBC: 3.5 MIL/uL — ABNORMAL LOW (ref 4.22–5.81)
RDW: 13.4 % (ref 11.5–15.5)
WBC: 14.5 10*3/uL — ABNORMAL HIGH (ref 4.0–10.5)

## 2018-04-27 LAB — GLUCOSE, CAPILLARY
Glucose-Capillary: 100 mg/dL — ABNORMAL HIGH (ref 70–99)
Glucose-Capillary: 115 mg/dL — ABNORMAL HIGH (ref 70–99)
Glucose-Capillary: 74 mg/dL (ref 70–99)
Glucose-Capillary: 81 mg/dL (ref 70–99)
Glucose-Capillary: 89 mg/dL (ref 70–99)
Glucose-Capillary: 91 mg/dL (ref 70–99)

## 2018-04-27 MED ORDER — DOCUSATE SODIUM 100 MG PO CAPS: 100.0000 mg | ORAL_CAPSULE | Freq: Two times a day (BID) | ORAL | 0 refills | Status: DC | PRN

## 2018-04-27 MED ORDER — TRAMADOL HCL 50 MG PO TABS: 50.0000 mg | ORAL_TABLET | Freq: Four times a day (QID) | ORAL | 0 refills | Status: DC | PRN

## 2018-04-27 MED ORDER — AMOXICILLIN-POT CLAVULANATE 875-125 MG PO TABS: 1.0000 | ORAL_TABLET | Freq: Two times a day (BID) | ORAL | 0 refills | Status: DC

## 2018-04-27 MED ORDER — ACETAMINOPHEN 325 MG PO TABS: 650.0000 mg | ORAL_TABLET | Freq: Four times a day (QID) | ORAL | Status: DC | PRN

## 2018-04-27 MED ORDER — PANTOPRAZOLE SODIUM 40 MG PO TBEC: 40.0000 mg | DELAYED_RELEASE_TABLET | Freq: Two times a day (BID) | ORAL | 0 refills | Status: DC

## 2018-04-27 NOTE — Progress Notes (Signed)
Made contact with WC adjustor Mignon Pine, in an attempt to arrange discharge services.  She states that pt's claim has not been processed at this time, and she would not be able to approve anything until she received needed records for pt.  She states she was unaware that pt was in the hospital until today when I told her.  I faxed all clinical information to her at [phone], per her request.    Was contacted as well by [facility] Case Manager Mamie Nick, [phone], ext [phone]; discussed case at length with her.  We discussed needs pt has for home including HHRN, PT, RW, discharge medications, and colostomy supplies.  She hopes to expedite getting his case processed and his needs met by tomorrow, 10/29.  I did speak with pt, and he understands why his discharge is being held today.    Will follow up with [facility] agency in AM to see where we are in the process.    Reinaldo Raddle, RN, BSN  Trauma/Neuro ICU Case Manager [phone]

## 2018-04-27 NOTE — Discharge Instructions (Signed)
[facility]      [facility], [place] [phone]  OPEN ABDOMINAL SURGERY: POST OP INSTRUCTIONS  Always review your discharge instruction sheet given to you by the facility where your surgery was performed.  IF YOU HAVE DISABILITY OR FAMILY LEAVE FORMS, YOU MUST BRING THEM TO THE OFFICE FOR PROCESSING.  PLEASE DO NOT GIVE THEM TO YOUR DOCTOR.  1. A prescription for pain medication may be given to you upon discharge.  Take your pain medication as prescribed, if needed.  If narcotic pain medicine is not needed, then you may take acetaminophen (Tylenol) or ibuprofen (Advil) as needed. 2. Take your usually prescribed medications unless otherwise directed. 3. If you need a refill on your pain medication, please contact your pharmacy. They will contact our office to request authorization.  Prescriptions will not be filled after 5pm or on week-ends. 4. You should follow a light diet the first few days after arrival home, such as soup and crackers, pudding, etc.unless your doctor has advised otherwise. A high-fiber, low fat diet can be resumed as tolerated.   Be sure to include lots of fluids daily. Most patients will experience some swelling and bruising on the chest and neck area.  Ice packs will help.  Swelling and bruising can take several days to resolve 5. Most patients will experience some swelling and bruising in the area of the incision. Ice pack will help. Swelling and bruising can take several days to resolve..  6. It is common to experience some constipation if taking pain medication after surgery.  Increasing fluid intake and taking a stool softener will usually help or prevent this problem from occurring.  A mild laxative (Milk of Magnesia or Miralax) should be taken according to package directions if there are no bowel movements after 48 hours. 7.  You may have steri-strips (small skin tapes) in place directly over the incision.  These strips should be left on the skin for 7-10 days.  If your  surgeon used skin glue on the incision, you may shower in 24 hours.  The glue will flake off over the next 2-3 weeks.  Any sutures or staples will be removed at the office during your follow-up visit. You may find that a light gauze bandage over your incision may keep your staples from being rubbed or pulled. You may shower and replace the bandage daily. 8. ACTIVITIES:  You may resume regular (light) daily activities beginning the next day--such as daily self-care, walking, climbing stairs--gradually increasing activities as tolerated.  You may have sexual intercourse when it is comfortable.  Refrain from any heavy lifting or straining until approved by your doctor. a. You may drive when you no longer are taking prescription pain medication, you can comfortably wear a seatbelt, and you can safely maneuver your car and apply brakes b. Return to Work: ___when cleared by provider________________________________ 9. You should see your doctor in the office for a follow-up appointment approximately two weeks after your surgery.  Make sure that you call for this appointment within a day or two after you arrive home to insure a convenient appointment time.  WHEN TO CALL YOUR DOCTOR: 1. Fever over 101.0 2. Inability to urinate 3. Nausea and/or vomiting 4. Extreme swelling or bruising 5. Continued bleeding from incision. 6. Increased pain, redness, or drainage from the incision. 7. Difficulty swallowing or breathing 8. Muscle cramping or spasms. 9. Numbness or tingling in hands or feet or around lips.  The clinic staff is available to answer your  questions during regular business hours.  Please dont hesitate to call and ask to speak to one of the nurses if you have concerns.  For further questions, please visit [email or web address]    Colostomy, Adult, Care After Refer to this sheet in the next few weeks. These instructions provide you with information about caring for yourself after your  procedure. Your health care provider may also give you more specific instructions. Your treatment has been planned according to current medical practices, but problems sometimes occur. Call your health care provider if you have any problems or questions after your procedure. What can I expect after the procedure? After the procedure, it is common to have:  Swelling at the opening that was created during the procedure (stoma).  Slight bleeding around the stoma.  Redness around the stoma.  Follow these instructions at home: Activity  Rest as needed while the stoma area heals.  Return to your normal activities as told by your health care provider. Ask your health care provider what activities are safe for you.  Avoid strenuous activity and abdominal exercises for 3 weeks or for as long as told by your health care provider.  Do not lift anything that is heavier than 10 lb (4.5 kg). Incision care   Follow instructions from your health care provider about how to take care of your incision. Make sure you: ? Wash your hands with soap and water before you change your bandage (dressing). If soap and water are not available, use hand sanitizer. ? Change your dressing as told by your health care provider. ? Leave stitches (sutures), skin glue, or adhesive strips in place. These skin closures may need to stay in place for 2 weeks or longer. If adhesive strip edges start to loosen and curl up, you may trim the loose edges. Do not remove adhesive strips completely unless your health care provider tells you to do that. Stoma Care  Keep the stoma area clean.  Clean and dry the skin around the stoma each time you change the colostomy bag. To clean the stoma area: ? Use warm water and only use cleansers that are recommended by your health care provider. ? Rinse the stoma area with plain water. ? Dry the area well.  Use stoma powder or ointment on your skin only as told by your health care provider.  Do not use any other powders, gels, wipes, or creams on your skin.  Check the stoma area every day for signs of infection. Check for: ? More redness, swelling, or pain. ? More fluid or blood. ? Pus or warmth.  Measure the stoma opening regularly and record the size. Watch for changes. Share this information with your health care provider. Bathing  Do not take baths, swim, or use a hot tub until your health care provider approves. Ask your health care provider if you can take showers. You may be able to shower with or without the colostomy bag in place. If you bathe with the bag on, dry the bag afterward.  Avoid using harsh or oily soaps when you bathe. Colostomy Bag Care  Follow instructions from your health care provider about how to empty or change the colostomy bag.  Keep colostomy supplies with you at all times.  Store all supplies in a cool, dry place.  Empty the colostomy bag: ? Whenever it is one-third to one-half full. ? At bedtime.  Replace the bag every 2-4 days or as told by your health care provider. Driving  Do  not drive for 24 hours if you received a sedative.  Do not drive or operate heavy machinery while taking prescription pain medicine. General instructions  Follow instructions from your health care provider about eating or drinking restrictions.  Take over-the-counter and prescription medicines only as told by your health care provider.  Avoid wearing clothes that are tight directly over your stoma.  Do not use any tobacco products, such as cigarettes, chewing tobacco, and e-cigarettes. If you need help quitting, ask your health care provider.  (Women) Ask your health care provider about becoming pregnant and about using birth control. Medicines may not be absorbed normally after the procedure.  Keep all follow-up visits as told by your health care provider. This is important. Contact a health care provider if:  You are having trouble caring for your  stoma or changing the colostomy bag.  You feel nauseous or you vomit.  You have a fever.  You havemore redness, swelling, or pain at the site of your stoma or around your anus.  You have more fluid or blood coming from your stoma or your anus.  Your stoma area feels warm to the touch.  You have pus coming from your stoma.  You notice a change in the size or appearance of the stoma.  You have abdominal pain, bloating, pressure, or cramping.  Your have stool more often or less often than your health care provider tells you to expect.  You are not making much urine. This may be a sign of dehydration. Get help right away if:  Your abdominal pain does not go away or it becomes severe.  You keep vomiting.  Your stool is not draining through the stoma.  You have chest pain or an irregular heartbeat. This information is not intended to replace advice given to you by your health care provider. Make sure you discuss any questions you have with your health care provider. Document Released: 11/07/2010 Document Revised: 10/26/2015 Document Reviewed: 02/28/2015 Elsevier Interactive Patient Education  2018 [employer].

## 2018-04-27 NOTE — Progress Notes (Signed)
CRITICAL VALUE ALERT  Critical Value:  Platelet 937  Date & Time Notied:  04/27/18, 0630  Provider Notified: Gaynelle Adu  Orders Received/Actions taken: awaiting for orders.
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
## 2018-04-27 NOTE — Consult Note (Addendum)
WOC Nurse ostomy follow up Stoma type/location: RUQ, transverse colostomy  Stomal assessment/size: 2 inch large red moist stoma, narrow at the bottom but expands as it procedures above skin level. Peristomal assessment: intact skin surrounding Output: mod amt liquid brown/green stool Ostomy pouching: 2pc. 2 3/4" pouch applied Education provided:  Pt assisted with pouch application. He states he is emptying without assistance. Reviewed pouching routines and emptying.  Educational materials at the bedside along with extra sets of pouching supplies. Provided patient with [facility] and previously marked items currently using, he will have HHRN to assist with supplies at DC  Enrolled patient in [place] Secure Start Discharge program: Yes Cammie Mcgee MSN, RN, [place], [place], [place] [phone]
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
## 2018-04-28 LAB — GLUCOSE, CAPILLARY
GLUCOSE-CAPILLARY: 122 mg/dL — AB (ref 70–99)
GLUCOSE-CAPILLARY: 88 mg/dL (ref 70–99)
Glucose-Capillary: 88 mg/dL (ref 70–99)
Glucose-Capillary: 103 mg/dL — ABNORMAL HIGH (ref 70–99)

## 2018-04-28 LAB — PATHOLOGIST SMEAR REVIEW
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
## 2018-04-28 NOTE — Progress Notes (Addendum)
Notified by Nevin Bloodgood with Worker's Comp agency that [facility] field case manager has been requested to handle pt's case and is coming to see pt today at the hospital to assist with discharge arrangements.  Will follow.  Reinaldo Raddle, RN, BSN  Trauma/Neuro ICU Case Manager [phone]  04/28/18 3:45 Field Case Manager, Letha Cape, and this RN Case Manager met with interpreter available to finalize discharge arrangements.  WC Case manager states she will provide drug card in AM for payment of all dc Rx, transportation home, and arrange HH/DME.  Will follow up with her in AM to confirm all arrangements.  She is aware of all needed dc appointments, and plans to go with pt to these appointments.  Notified PA that pt will dc tomorrow, not today, after confirmation of all dc arrangements.    Reinaldo Raddle, RN, BSN  Trauma/Neuro ICU Case Manager [phone]
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
## 2018-04-29 ENCOUNTER — Other Ambulatory Visit: Payer: Self-pay

## 2018-04-29 ENCOUNTER — Encounter (HOSPITAL_COMMUNITY): Payer: Self-pay | Admitting: *Deleted

## 2018-04-29 MED FILL — traMADol HCL 50 MG TABS: 50 | 8 days supply | Qty: 30 | Fill #0

## 2018-04-29 MED FILL — PANTOPRAZOLE SOD DR 40 MG T: 40 | 30 days supply | Qty: 60 | Fill #0
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
## 2018-04-29 NOTE — Progress Notes (Signed)
Patient discharged to home. Verbalizes understanding of all discharge instructions including incision care, discharge medications, and follow up MD visits.  

## 2018-04-29 NOTE — Progress Notes (Signed)
   04/29/18 0900  Clinical Encounter Type  Visited With Patient not available  Visit Type Initial   Pt appeared to be in restroom when attempted to visit.    Margretta Sidle resident, [phone]
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
## 2018-05-08 ENCOUNTER — Ambulatory Visit: Payer: Worker's Compensation | Attending: Family Medicine | Admitting: Physician Assistant

## 2018-05-08 VITALS — BP 112/77 | HR 84 | Temp 98.9°F | Resp 16 | Ht 70.0 in | Wt 152.0 lb

## 2018-05-08 DIAGNOSIS — S31109A Unspecified open wound of abdominal wall, unspecified quadrant without penetration into peritoneal cavity, initial encounter: Secondary | ICD-10-CM | POA: Insufficient documentation

## 2018-05-08 DIAGNOSIS — E785 Hyperlipidemia, unspecified: Secondary | ICD-10-CM | POA: Insufficient documentation

## 2018-05-08 DIAGNOSIS — S37032A Laceration of left kidney, unspecified degree, initial encounter: Secondary | ICD-10-CM | POA: Diagnosis not present

## 2018-05-08 DIAGNOSIS — Z933 Colostomy status: Secondary | ICD-10-CM | POA: Insufficient documentation

## 2018-05-08 DIAGNOSIS — R5381 Other malaise: Secondary | ICD-10-CM | POA: Insufficient documentation

## 2018-05-08 DIAGNOSIS — Z9889 Other specified postprocedural states: Secondary | ICD-10-CM | POA: Insufficient documentation

## 2018-05-08 DIAGNOSIS — F329 Major depressive disorder, single episode, unspecified: Secondary | ICD-10-CM | POA: Diagnosis not present

## 2018-05-08 DIAGNOSIS — Z9081 Acquired absence of spleen: Secondary | ICD-10-CM | POA: Diagnosis not present

## 2018-05-08 DIAGNOSIS — Z09 Encounter for follow-up examination after completed treatment for conditions other than malignant neoplasm: Secondary | ICD-10-CM | POA: Diagnosis not present

## 2018-05-08 DIAGNOSIS — R319 Hematuria, unspecified: Secondary | ICD-10-CM | POA: Diagnosis not present

## 2018-05-08 DIAGNOSIS — Z79899 Other long term (current) drug therapy: Secondary | ICD-10-CM | POA: Diagnosis not present

## 2018-05-08 DIAGNOSIS — J90 Pleural effusion, not elsewhere classified: Secondary | ICD-10-CM | POA: Diagnosis not present

## 2018-05-08 DIAGNOSIS — W3400XA Accidental discharge from unspecified firearms or gun, initial encounter: Secondary | ICD-10-CM | POA: Insufficient documentation

## 2018-05-08 DIAGNOSIS — Z9049 Acquired absence of other specified parts of digestive tract: Secondary | ICD-10-CM | POA: Insufficient documentation

## 2018-05-08 DIAGNOSIS — F209 Schizophrenia, unspecified: Secondary | ICD-10-CM | POA: Diagnosis not present

## 2018-05-08 DIAGNOSIS — S27809A Unspecified injury of diaphragm, initial encounter: Secondary | ICD-10-CM | POA: Insufficient documentation

## 2018-05-08 NOTE — Patient Instructions (Signed)
Keep appt on 05/12/18 with the trauma clinic No working for now No driving for now
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
## 2018-05-08 NOTE — Progress Notes (Signed)
Hospital f/u.

## 2018-06-08 ENCOUNTER — Ambulatory Visit: Payer: Self-pay | Admitting: Family Medicine

## 2018-06-17 ENCOUNTER — Encounter (HOSPITAL_COMMUNITY): Payer: Self-pay | Admitting: Emergency Medicine

## 2018-06-17 ENCOUNTER — Emergency Department (HOSPITAL_COMMUNITY)
Admission: EM | Admit: 2018-06-17 | Discharge: 2018-06-17 | Disposition: A | Discharge status: Home / Self Care | Payer: Worker's Compensation | Attending: Emergency Medicine | Admitting: Emergency Medicine

## 2018-06-17 ENCOUNTER — Other Ambulatory Visit: Payer: Self-pay

## 2018-06-17 DIAGNOSIS — Z433 Encounter for attention to colostomy: Secondary | ICD-10-CM | POA: Insufficient documentation

## 2018-06-17 NOTE — ED Notes (Signed)
Pt unable to sign due to lack of signature pad.  
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
## 2018-06-17 NOTE — ED Triage Notes (Signed)
Pt here requesting colostomy bag replacement.  Pt states he is out at home and will be a few days before he can receive a new shipment.

## 2018-06-17 NOTE — Discharge Instructions (Addendum)
You were evaluated in the Emergency Department and after careful evaluation, we did not find any emergent condition requiring admission or further testing in the hospital. ° °Please return to the Emergency Department if you experience any worsening of your condition.  We encourage you to follow up with a primary care provider.  Thank you for allowing us to be a part of your care. °

## 2018-06-17 NOTE — ED Notes (Signed)
Pt stable, ambulatory, and verbalizes understanding of d/c instructions.  

## 2018-07-16 ENCOUNTER — Encounter (HOSPITAL_COMMUNITY): Payer: Self-pay

## 2018-07-16 ENCOUNTER — Emergency Department (HOSPITAL_COMMUNITY): Payer: Self-pay

## 2018-07-16 ENCOUNTER — Emergency Department (HOSPITAL_COMMUNITY)
Admission: EM | Admit: 2018-07-16 | Discharge: 2018-07-16 | Disposition: A | Discharge status: Home / Self Care | Payer: Self-pay | Attending: Emergency Medicine | Admitting: Emergency Medicine

## 2018-07-16 DIAGNOSIS — E785 Hyperlipidemia, unspecified: Secondary | ICD-10-CM | POA: Insufficient documentation

## 2018-07-16 DIAGNOSIS — S0242XB Fracture of alveolus of maxilla, initial encounter for open fracture: Secondary | ICD-10-CM

## 2018-07-16 DIAGNOSIS — Y998 Other external cause status: Secondary | ICD-10-CM | POA: Insufficient documentation

## 2018-07-16 DIAGNOSIS — R569 Unspecified convulsions: Secondary | ICD-10-CM

## 2018-07-16 DIAGNOSIS — Z79899 Other long term (current) drug therapy: Secondary | ICD-10-CM | POA: Insufficient documentation

## 2018-07-16 DIAGNOSIS — Y92480 Sidewalk as the place of occurrence of the external cause: Secondary | ICD-10-CM | POA: Insufficient documentation

## 2018-07-16 DIAGNOSIS — S01512A Laceration without foreign body of oral cavity, initial encounter: Secondary | ICD-10-CM | POA: Insufficient documentation

## 2018-07-16 DIAGNOSIS — S01511A Laceration without foreign body of lip, initial encounter: Secondary | ICD-10-CM

## 2018-07-16 DIAGNOSIS — Z23 Encounter for immunization: Secondary | ICD-10-CM | POA: Insufficient documentation

## 2018-07-16 DIAGNOSIS — Y33XXXA Other specified events, undetermined intent, initial encounter: Secondary | ICD-10-CM | POA: Insufficient documentation

## 2018-07-16 DIAGNOSIS — S025XXB Fracture of tooth (traumatic), initial encounter for open fracture: Secondary | ICD-10-CM

## 2018-07-16 DIAGNOSIS — Y939 Activity, unspecified: Secondary | ICD-10-CM | POA: Insufficient documentation

## 2018-07-16 HISTORY — DX: Unspecified convulsions: R56.9

## 2018-07-16 LAB — COMPREHENSIVE METABOLIC PANEL
ALT: 12 U/L (ref 0–44)
AST: 31 U/L (ref 15–41)
Albumin: 5.2 g/dL — ABNORMAL HIGH (ref 3.5–5.0)
Alkaline Phosphatase: 51 U/L (ref 38–126)
Anion gap: 23 — ABNORMAL HIGH (ref 5–15)
BUN: 11 mg/dL (ref 6–20)
CO2: 14 mmol/L — ABNORMAL LOW (ref 22–32)
Calcium: 10.4 mg/dL — ABNORMAL HIGH (ref 8.9–10.3)
Chloride: 101 mmol/L (ref 98–111)
Creatinine, Ser: 1.51 mg/dL — ABNORMAL HIGH (ref 0.61–1.24)
GFR calc Af Amer: >60 mL/min (ref >60)
GFR calc non Af Amer: 59 mL/min — ABNORMAL LOW (ref >60)
Glucose, Bld: 197 mg/dL — ABNORMAL HIGH (ref 70–99)
Potassium: 4 mmol/L (ref 3.5–5.1)
Sodium: 138 mmol/L (ref 135–145)
Total Bilirubin: 2.9 mg/dL — ABNORMAL HIGH (ref 0.3–1.2)
Total Protein: 8.7 g/dL — ABNORMAL HIGH (ref 6.5–8.1)

## 2018-07-16 LAB — CBC WITH DIFFERENTIAL/PLATELET
Abs Immature Granulocytes: 0.07 10*3/uL (ref 0.00–0.07)
Basophils Absolute: 0 10*3/uL (ref 0.0–0.1)
Basophils Relative: 0 %
Eosinophils Absolute: 0.1 10*3/uL (ref 0.0–0.5)
Eosinophils Relative: 1 %
HCT: 44.6 % (ref 39.0–52.0)
Hemoglobin: 14.5 g/dL (ref 13.0–17.0)
Immature Granulocytes: 1 %
Lymphocytes Relative: 11 %
Lymphs Abs: 1.2 10*3/uL (ref 0.7–4.0)
MCH: 29.3 pg (ref 26.0–34.0)
MCHC: 32.5 g/dL (ref 30.0–36.0)
MCV: 90.1 fL (ref 80.0–100.0)
MONO ABS: 0.6 10*3/uL (ref 0.1–1.0)
Monocytes Relative: 5 %
Neutro Abs: 8.3 10*3/uL — ABNORMAL HIGH (ref 1.7–7.7)
Neutrophils Relative %: 82 %
PLATELETS: 297 10*3/uL (ref 150–400)
RBC: 4.95 MIL/uL (ref 4.22–5.81)
RDW: 15.1 % (ref 11.5–15.5)
WBC: 10.2 10*3/uL (ref 4.0–10.5)
nRBC: 0 % (ref 0.0–0.2)

## 2018-07-16 LAB — ACETAMINOPHEN LEVEL: Acetaminophen (Tylenol), Serum: <10 ug/mL — ABNORMAL LOW (ref 10–30)

## 2018-07-16 LAB — SALICYLATE LEVEL: Salicylate Lvl: <7 mg/dL (ref 2.8–30.0)

## 2018-07-16 LAB — ETHANOL: Alcohol, Ethyl (B): <10 mg/dL (ref <10)

## 2018-07-16 MED ORDER — LEVETIRACETAM 500 MG PO TABS
500.0000 mg | ORAL_TABLET | Freq: Once | ORAL | Status: AC
  Administered 2018-07-16: 500 mg via ORAL
  Filled 2018-07-16: qty 1

## 2018-07-16 MED ORDER — LIDOCAINE HCL (PF) 1 % IJ SOLN
5.0000 mL | Freq: Once | INTRAMUSCULAR | Status: AC
  Administered 2018-07-16: 5 mL
  Filled 2018-07-16: qty 5

## 2018-07-16 MED ORDER — HYDROCODONE-ACETAMINOPHEN 5-325 MG PO TABS: 2.0000 | ORAL_TABLET | ORAL | 0 refills | Status: DC | PRN

## 2018-07-16 MED ORDER — PENICILLIN V POTASSIUM 500 MG PO TABS: 500.0000 mg | ORAL_TABLET | Freq: Four times a day (QID) | ORAL | 0 refills | Status: AC

## 2018-07-16 MED ORDER — SODIUM CHLORIDE 0.9 % IV BOLUS
1000.0000 mL | Freq: Once | INTRAVENOUS | Status: AC
  Administered 2018-07-16: 1000 mL via INTRAVENOUS

## 2018-07-16 MED ORDER — TETANUS-DIPHTH-ACELL PERTUSSIS 5-2.5-18.5 LF-MCG/0.5 IM SUSP
0.5000 mL | Freq: Once | INTRAMUSCULAR | Status: AC
  Administered 2018-07-16: 0.5 mL via INTRAMUSCULAR
  Filled 2018-07-16: qty 0.5

## 2018-07-16 MED ORDER — GADOBUTROL 1 MMOL/ML IV SOLN
7.0000 mL | Freq: Once | INTRAVENOUS | Status: AC | PRN
  Administered 2018-07-16: 7 mL via INTRAVENOUS

## 2018-07-16 MED ORDER — PENICILLIN V POTASSIUM 250 MG PO TABS
500.0000 mg | ORAL_TABLET | Freq: Once | ORAL | Status: AC
  Administered 2018-07-16: 500 mg via ORAL
  Filled 2018-07-16: qty 2

## 2018-07-16 MED ORDER — LEVETIRACETAM 500 MG PO TABS: 500.0000 mg | ORAL_TABLET | Freq: Two times a day (BID) | ORAL | 0 refills | Status: DC

## 2018-07-16 NOTE — ED Notes (Signed)
Patient transported to MRI 

## 2018-07-16 NOTE — ED Notes (Signed)
Provider at bedside

## 2018-07-16 NOTE — ED Notes (Signed)
X RAY at bedside 

## 2018-07-16 NOTE — Discharge Instructions (Addendum)
Take Penicillin as prescribed and complete the full course. Take Norco as needed as prescribed for pain.  Take Keppra as prescribed for seizures.  Follow up with oral surgery on Monday. Liquid diet until follow up to try and prevent loss of your tooth.  You stitches will dissolve, rinse your mouth with Listerine or salt water three times each day.  Follow up with Neurology for a test to see if you have seizures. Return to the hospital for new or worsening symptoms.

## 2018-07-16 NOTE — ED Notes (Signed)
Pt back from MRI 

## 2018-07-16 NOTE — ED Provider Notes (Signed)
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 with nonbloody stool.   Patient's pastor has arrived at the bedside, patient speaks [place] however converses more fluently and [place] with his pastor.  Patient states that he had not had anything to eat today, was walking a great distance when this happened.    [LM]  1756 CT head and C-spine without acute injury, CT maxillofacial shows fracture through the anterior maxillary alveolus with avulsion and superior/anterior displacement of the medial incisor. Case discussed with Dr. Barbette Merino, oncall with oral surgery, recommends pull tooth back into place, apply composite to stabilize the tooth, if closure needed, used 3-0 chromic, start Penicillin for mouth injury, recheck in the office on Monday.    [LM]  [id number] Neurology paged for consult due to new diagnosis seizure.  Case discussed with neurology, recommends hospitalist admit, EEG, MRI with contrast.   [LM]  1907 Case discussed with Dr. Rozelle Logan, neurology, will follow up with MRI results, if normal, patient may be dc to follow up outpatient for EEG.   [LM]  2241 Lip laceration closed, subluxed and fractured right upper cental incisor reduced and stabilized with Theodosia Paling.  Case reviewed with Dr. Denton Lank, ER  attending, recommends dc home with Keppra. Plan is to see oral surgery on Monday, given PCN and Norco for pain,  advised he may lose this tooth, should adhere to liquid diet and rinse mouth TID.  Given Keppra for seizure, advised to see neurology for follow up for EEG. Return to ER for any concerns or new symptoms.    [LM]    Clinical Course User Index [LM] Jeannie Fend, PA-C   Final Clinical Impressions(s) / ED Diagnoses   Final diagnoses:  Seizure-like activity (HCC)  Open partial fracture of alveolus of maxilla, initial encounter [facility])  Open fracture of tooth, initial encounter  Complicated laceration of lip, initial encounter    ED Discharge Orders         Ordered    penicillin v potassium (VEETID) 500 MG tablet  4 times daily     07/16/18 2152    levETIRAcetam (KEPPRA) 500 MG tablet  2 times daily     07/16/18 2155    HYDROcodone-acetaminophen (NORCO/VICODIN) 5-325 MG tablet  Every 4 hours PRN     07/16/18 2158           Jeannie Fend, PA-C 07/16/18 2244    Cathren Laine, MD 07/23/18 1416

## 2018-07-16 NOTE — ED Triage Notes (Addendum)
Pt arrives from [facility]- after having a seizure while walking down the street- Pt arrives to ED alert and oriented to person and place. Pt has mouth and facial trauma- pt has laceration in upper gums and lost his right front tooth.  Pt arrives in C collar.  CBG- 150 R16 120/64 P128

## 2018-07-18 ENCOUNTER — Emergency Department (HOSPITAL_COMMUNITY): Payer: Self-pay

## 2018-07-18 ENCOUNTER — Inpatient Hospital Stay (HOSPITAL_COMMUNITY)
Admission: EM | Admit: 2018-07-18 | Discharge: 2018-07-21 | DRG: 641 | Disposition: A | Discharge status: Home / Self Care | Payer: Self-pay | Attending: Internal Medicine | Admitting: Internal Medicine

## 2018-07-18 ENCOUNTER — Encounter (HOSPITAL_COMMUNITY): Payer: Self-pay | Admitting: Internal Medicine

## 2018-07-18 DIAGNOSIS — Z79891 Long term (current) use of opiate analgesic: Secondary | ICD-10-CM

## 2018-07-18 DIAGNOSIS — I517 Cardiomegaly: Secondary | ICD-10-CM | POA: Diagnosis present

## 2018-07-18 DIAGNOSIS — F333 Major depressive disorder, recurrent, severe with psychotic symptoms: Secondary | ICD-10-CM | POA: Diagnosis present

## 2018-07-18 DIAGNOSIS — F209 Schizophrenia, unspecified: Secondary | ICD-10-CM | POA: Diagnosis present

## 2018-07-18 DIAGNOSIS — Z87828 Personal history of other (healed) physical injury and trauma: Secondary | ICD-10-CM

## 2018-07-18 DIAGNOSIS — G40919 Epilepsy, unspecified, intractable, without status epilepticus: Secondary | ICD-10-CM | POA: Diagnosis present

## 2018-07-18 DIAGNOSIS — S0242XD Fracture of alveolus of maxilla, subsequent encounter for fracture with routine healing: Secondary | ICD-10-CM

## 2018-07-18 DIAGNOSIS — M6282 Rhabdomyolysis: Secondary | ICD-10-CM | POA: Diagnosis present

## 2018-07-18 DIAGNOSIS — S0292XS Unspecified fracture of facial bones, sequela: Secondary | ICD-10-CM

## 2018-07-18 DIAGNOSIS — R4182 Altered mental status, unspecified: Secondary | ICD-10-CM | POA: Diagnosis present

## 2018-07-18 DIAGNOSIS — R339 Retention of urine, unspecified: Secondary | ICD-10-CM | POA: Diagnosis present

## 2018-07-18 DIAGNOSIS — W19XXXD Unspecified fall, subsequent encounter: Secondary | ICD-10-CM | POA: Diagnosis present

## 2018-07-18 DIAGNOSIS — W19XXXS Unspecified fall, sequela: Secondary | ICD-10-CM

## 2018-07-18 DIAGNOSIS — E8889 Other specified metabolic disorders: Secondary | ICD-10-CM | POA: Diagnosis present

## 2018-07-18 DIAGNOSIS — G40909 Epilepsy, unspecified, not intractable, without status epilepticus: Secondary | ICD-10-CM | POA: Diagnosis present

## 2018-07-18 DIAGNOSIS — S01511D Laceration without foreign body of lip, subsequent encounter: Secondary | ICD-10-CM

## 2018-07-18 DIAGNOSIS — E162 Hypoglycemia, unspecified: Principal | ICD-10-CM | POA: Diagnosis present

## 2018-07-18 DIAGNOSIS — R338 Other retention of urine: Secondary | ICD-10-CM | POA: Diagnosis present

## 2018-07-18 DIAGNOSIS — Z933 Colostomy status: Secondary | ICD-10-CM

## 2018-07-18 DIAGNOSIS — Z79899 Other long term (current) drug therapy: Secondary | ICD-10-CM

## 2018-07-18 LAB — COMPREHENSIVE METABOLIC PANEL
ALT: 15 U/L (ref 0–44)
AST: 63 U/L — ABNORMAL HIGH (ref 15–41)
Albumin: 4.9 g/dL (ref 3.5–5.0)
Alkaline Phosphatase: 49 U/L (ref 38–126)
Anion gap: 15 (ref 5–15)
BUN: 5 mg/dL — ABNORMAL LOW (ref 6–20)
CO2: 24 mmol/L (ref 22–32)
Calcium: 9.8 mg/dL (ref 8.9–10.3)
Chloride: 102 mmol/L (ref 98–111)
Creatinine, Ser: 0.91 mg/dL (ref 0.61–1.24)
GFR calc Af Amer: >60 mL/min (ref >60)
GFR calc non Af Amer: >60 mL/min (ref >60)
Glucose, Bld: 100 mg/dL — ABNORMAL HIGH (ref 70–99)
Potassium: 3.2 mmol/L — ABNORMAL LOW (ref 3.5–5.1)
SODIUM: 141 mmol/L (ref 135–145)
Total Bilirubin: 3.4 mg/dL — ABNORMAL HIGH (ref 0.3–1.2)
Total Protein: 8.3 g/dL — ABNORMAL HIGH (ref 6.5–8.1)

## 2018-07-18 LAB — CK
Total CK: 2958 U/L — ABNORMAL HIGH (ref 49–397)
Total CK: 2887 U/L — ABNORMAL HIGH (ref 49–397)

## 2018-07-18 LAB — CBC
HCT: 43.4 % (ref 39.0–52.0)
Hemoglobin: 14.4 g/dL (ref 13.0–17.0)
MCH: 30.3 pg (ref 26.0–34.0)
MCHC: 33.2 g/dL (ref 30.0–36.0)
MCV: 91.2 fL (ref 80.0–100.0)
Platelets: 282 10*3/uL (ref 150–400)
RBC: 4.76 MIL/uL (ref 4.22–5.81)
RDW: 15.5 % (ref 11.5–15.5)
WBC: 9.6 10*3/uL (ref 4.0–10.5)
nRBC: 0 % (ref 0.0–0.2)

## 2018-07-18 LAB — SALICYLATE LEVEL: Salicylate Lvl: <7 mg/dL (ref 2.8–30.0)

## 2018-07-18 LAB — CBG MONITORING, ED
Glucose-Capillary: 53 mg/dL — ABNORMAL LOW (ref 70–99)
Glucose-Capillary: 69 mg/dL — ABNORMAL LOW (ref 70–99)
Glucose-Capillary: 94 mg/dL (ref 70–99)
Glucose-Capillary: 65 mg/dL — ABNORMAL LOW (ref 70–99)

## 2018-07-18 LAB — ETHANOL

## 2018-07-18 LAB — TSH: TSH: 0.557 u[IU]/mL (ref 0.350–4.500)

## 2018-07-18 LAB — URINALYSIS, COMPLETE (UACMP) WITH MICROSCOPIC
Bacteria, UA: NONE SEEN
Bilirubin Urine: NEGATIVE
Glucose, UA: 50 mg/dL — AB
Ketones, ur: 80 mg/dL — AB
Leukocytes, UA: NEGATIVE
Nitrite: NEGATIVE
Protein, ur: NEGATIVE mg/dL
Specific Gravity, Urine: 1.018 (ref 1.005–1.030)
pH: 5 (ref 5.0–8.0)

## 2018-07-18 LAB — RAPID URINE DRUG SCREEN, HOSP PERFORMED
Amphetamines: NOT DETECTED
Barbiturates: NOT DETECTED
Benzodiazepines: NOT DETECTED
Cocaine: NOT DETECTED
Opiates: POSITIVE — AB
Tetrahydrocannabinol: NOT DETECTED

## 2018-07-18 LAB — TROPONIN I: Troponin I: <0.03 ng/mL (ref <0.03)

## 2018-07-18 LAB — AMMONIA: Ammonia: <9 umol/L — ABNORMAL LOW (ref 9–35)

## 2018-07-18 LAB — VITAMIN B12: VITAMIN B 12: 340 pg/mL (ref 180–914)

## 2018-07-18 LAB — FOLATE: Folate: 9.3 ng/mL (ref >5.9)

## 2018-07-18 LAB — ACETAMINOPHEN LEVEL: Acetaminophen (Tylenol), Serum: <10 ug/mL — ABNORMAL LOW (ref 10–30)

## 2018-07-18 MED ORDER — ONDANSETRON HCL 4 MG/2ML IJ SOLN: 4.0000 mg | Freq: Four times a day (QID) | INTRAMUSCULAR | Status: DC | PRN

## 2018-07-18 MED ORDER — LEVETIRACETAM IN NACL 500 MG/100ML IV SOLN
500.0000 mg | Freq: Once | INTRAVENOUS | Status: AC
  Administered 2018-07-18: 500 mg via INTRAVENOUS
  Filled 2018-07-18: qty 100

## 2018-07-18 MED ORDER — DEXTROSE-NACL 5-0.9 % IV SOLN
INTRAVENOUS | Status: DC
  Administered 2018-07-18 – 2018-07-21 (×3): via INTRAVENOUS

## 2018-07-18 MED ORDER — THIAMINE HCL 100 MG/ML IJ SOLN
100.0000 mg | Freq: Once | INTRAMUSCULAR | Status: AC
  Administered 2018-07-18: 100 mg via INTRAVENOUS
  Filled 2018-07-18: qty 2

## 2018-07-18 MED ORDER — BENZTROPINE MESYLATE 0.5 MG PO TABS
0.5000 mg | ORAL_TABLET | Freq: Two times a day (BID) | ORAL | Status: DC
  Administered 2018-07-19 – 2018-07-21 (×5): 0.5 mg via ORAL
  Filled 2018-07-18 (×5): qty 1

## 2018-07-18 MED ORDER — ONDANSETRON HCL 4 MG PO TABS: 4.0000 mg | ORAL_TABLET | Freq: Four times a day (QID) | ORAL | Status: DC | PRN

## 2018-07-18 MED ORDER — SODIUM CHLORIDE 0.9 % IV BOLUS
1000.0000 mL | Freq: Once | INTRAVENOUS | Status: AC
  Administered 2018-07-18: 1000 mL via INTRAVENOUS

## 2018-07-18 MED ORDER — POTASSIUM CHLORIDE CRYS ER 20 MEQ PO TBCR
40.0000 meq | EXTENDED_RELEASE_TABLET | Freq: Once | ORAL | Status: AC
  Administered 2018-07-18: 40 meq via ORAL
  Filled 2018-07-18: qty 2

## 2018-07-18 MED ORDER — ACETAMINOPHEN 325 MG PO TABS: 650.0000 mg | ORAL_TABLET | Freq: Four times a day (QID) | ORAL | Status: DC | PRN

## 2018-07-18 MED ORDER — HALOPERIDOL 1 MG PO TABS
5.0000 mg | ORAL_TABLET | Freq: Two times a day (BID) | ORAL | Status: DC
  Administered 2018-07-19 – 2018-07-21 (×5): 5 mg via ORAL
  Filled 2018-07-18 (×4): qty 5
  Filled 2018-07-18: qty 1

## 2018-07-18 MED ORDER — LIDOCAINE VISCOUS HCL 2 % MT SOLN
15.0000 mL | Freq: Once | OROMUCOSAL | Status: AC
  Administered 2018-07-18: 15 mL via OROMUCOSAL
  Filled 2018-07-18: qty 15

## 2018-07-18 MED ORDER — LEVETIRACETAM IN NACL 500 MG/100ML IV SOLN
500.0000 mg | Freq: Two times a day (BID) | INTRAVENOUS | Status: DC
  Administered 2018-07-19 – 2018-07-21 (×5): 500 mg via INTRAVENOUS
  Filled 2018-07-18 (×6): qty 100

## 2018-07-18 MED ORDER — SODIUM CHLORIDE 0.9 % IV SOLN
INTRAVENOUS | Status: DC
  Administered 2018-07-18: 22:00:00 via INTRAVENOUS

## 2018-07-18 MED ORDER — ACETAMINOPHEN 650 MG RE SUPP: 650.0000 mg | Freq: Four times a day (QID) | RECTAL | Status: DC | PRN
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
## 2018-07-18 NOTE — ED Notes (Signed)
Kreg Shropshire' EMT informed this Clinical research associate that patient was incontinent of urine, had pulled IV out and was standing up beside the bed. No other acute changes noted to patient condition. Hygiene provided for patient, new gown, bed linens and IV placed. Warm blanket applied, will continue to monitor. Patient remains non-verbal.

## 2018-07-18 NOTE — H&P (Signed)
History and Physical    Peter Golden ZOX:[id number] DOB: 1981-01-18 DOA: 07/18/2018  PCP: Patient, No Pcp Per  Patient coming from: Home.  Chief Complaint: Altered mental status.  Patient speaks [place].  HPI: Peter Golden is a 38 y.o. male with history of possible schizoaffective schizophrenia who had come to the ER 2 days back after patient's bystander found that patient had a fall and following which patient had tonic-clonic seizure-like activity.  At the time patient was also observed to be having facial trauma involving his lips and face and tooth.  In the ER patient had CT head C-spine and CT maxillofacial.  CAT scan showed -  Fracture through the anterior maxillary alveolus with avulsion and superior/anterior displacement of the medial incisor. Overlying soft tissue swelling with small retained bone fragments.  Since patient had a seizure-like activity ER physician discussed with on-call neurologist Dr. Laurence Slate who advised patient to be started on Keppra.  Further facial trauma with anterior displacement of the medial incisor ER physician discussed with Dr. Barbette Merino who advised suturing and follow-up with Dr. Barbette Merino as outpatient with discharge on penicillin.  Since discharge patient was observed by patient's pastor to have not moved out of his chair for 2 days and has not had any communication or taking his medications.  And was brought back to the ER.  ED Course: In the ER patient is nonverbal and at times getting aggressive.  Behavioral health was consulted and patient was accepted to their service but patient remained hypoglycemic and labs also showed rhabdomyolysis.  UA showed ketosis.  Patient was started on D5 normal saline for hypoglycemia ketosis and rhabdomyolysis admitted under medical service for now.  On my exam patient is nonverbal and does not communicate.  Pupils are equal and reacting to light and moves all extremities.  Review of Systems: As per HPI, rest all  negative.   Past Medical History:  Diagnosis Date  . Schizo affective schizophrenia (HCC)   . Seizures (HCC)     Past Surgical History:  Procedure Laterality Date  . IR THORACENTESIS ASP PLEURAL SPACE W/IMG GUIDE  04/22/2018  . LAPAROTOMY N/A 04/15/2018   Procedure: EXPLORATORY LAPAROTOMY with splenectomy, transverse colectomy, partial gastrectomy, repair of diaphragmatic hernia, left 14 Fr pigtail tube thoracostomy, repair of umbilical hernia;  Surgeon: Almond Lint, MD;  Location: [facility] OR;  Service: General;  Laterality: N/A;     reports that he has never smoked. He has never used smokeless tobacco. He reports previous alcohol use. He reports previous drug use.  No Known Allergies  Family History  Problem Relation Age of Onset  . Mental illness Neg Hx     Prior to Admission medications   Medication Sig Start Date End Date Taking? Authorizing Provider  levETIRAcetam (KEPPRA) 500 MG tablet Take 1 tablet (500 mg total) by mouth 2 (two) times daily for 30 days. 07/16/18 08/15/18 Yes Jeannie Fend, PA-C  penicillin v potassium (VEETID) 500 MG tablet Take 1 tablet (500 mg total) by mouth 4 (four) times daily for 10 days. 07/16/18 07/26/18 Yes Jeannie Fend, PA-C  acetaminophen (TYLENOL) 325 MG tablet Take 2 tablets (650 mg total) by mouth every 6 (six) hours as needed for mild pain or fever. 04/27/18   Rayburn, Alphonsus Sias, PA-C  benztropine (COGENTIN) 0.5 MG tablet Take 1 tablet (0.5 mg total) by mouth at bedtime. For prevention of drug induced tremors Patient not taking: Reported on 05/08/2018 07/09/17   Sanjuana Kava, NP  docusate sodium (COLACE) 100 MG capsule Take 1 capsule (100 mg total) by mouth 2 (two) times daily as needed for mild constipation. Patient not taking: Reported on 05/08/2018 04/27/18   Rayburn, [name] A, PA-C  haloperidol (HALDOL) 10 MG tablet Take 1 tablet (10 mg total) by mouth at bedtime. For mood control Patient not taking: Reported on 05/08/2018 07/09/17   Armandina Stammer  I, NP  haloperidol decanoate (HALDOL DECANOATE) 100 MG/ML injection Inject 0.5 mLs (50 mg total) into the muscle every 30 (thirty) days. (Due on 08-08-17): For mood control Patient not taking: Reported on 05/08/2018 08/29/17   Maryagnes Amos, FNP  HYDROcodone-acetaminophen (NORCO/VICODIN) 5-325 MG tablet Take 2 tablets by mouth every 4 (four) hours as needed. 07/16/18   Jeannie Fend, PA-C  hydrOXYzine (ATARAX/VISTARIL) 25 MG tablet Take 1 tablet (25 mg total) by mouth every 6 (six) hours as needed (sleep). Anxiety Patient not taking: Reported on 05/08/2018 07/09/17   Armandina Stammer I, NP  mirtazapine (REMERON) 15 MG tablet Take 1 tablet (15 mg total) by mouth at bedtime. For depression/sleep Patient not taking: Reported on 05/08/2018 07/09/17   Armandina Stammer I, NP  pantoprazole (PROTONIX) 40 MG tablet Take 1 tablet (40 mg total) by mouth 2 (two) times daily. Patient not taking: Reported on 06/17/2018 04/27/18   Rayburn, [name] A, PA-C  traMADol (ULTRAM) 50 MG tablet Take 1 tablet (50 mg total) by mouth every 6 (six) hours as needed for moderate pain. Patient not taking: Reported on 06/17/2018 04/27/18   Marvel Plan, PA-C    Physical Exam: Vitals:   07/18/18 2130 07/18/18 2200 07/18/18 2208 07/18/18 2243  BP: 125/85 (!) 142/97  134/82  Pulse: 78 78 78 78  Resp:    18  Temp:      TempSrc:      SpO2: 99% 99%  100%      Constitutional: Moderately built and nourished. Vitals:   07/18/18 2130 07/18/18 2200 07/18/18 2208 07/18/18 2243  BP: 125/85 (!) 142/97  134/82  Pulse: 78 78 78 78  Resp:    18  Temp:      TempSrc:      SpO2: 99% 99%  100%   Eyes: Anicteric no pallor. ENMT: Lips are swollen from recent laceration. Neck: No mass felt.  No neck rigidity. Respiratory: No rhonchi or crepitations. Cardiovascular: S1-S2 heard. Abdomen: Soft nontender bowel sounds present. Musculoskeletal: No edema. Skin: No rash. Neurologic: Patient is alert awake but does not communicate.  Moves  all extremities.  Pupils are equal and reacting to light. Psychiatric: Not communicating.   Labs on Admission: I have personally reviewed following labs and imaging studies  CBC: Recent Labs  Lab 07/16/18 1511 07/18/18 1345  WBC 10.2 9.6  NEUTROABS 8.3*  --   HGB 14.5 14.4  HCT 44.6 43.4  MCV 90.1 91.2  PLT 297 282   Basic Metabolic Panel: Recent Labs  Lab 07/16/18 1511 07/18/18 1345  NA 138 141  K 4.0 3.2*  CL 101 102  CO2 14* 24  GLUCOSE 197* 100*  BUN 11 5*  CREATININE 1.51* 0.91  CALCIUM 10.4* 9.8   GFR: CrCl cannot be calculated (Unknown ideal weight.). Liver Function Tests: Recent Labs  Lab 07/16/18 1511 07/18/18 1345  AST 31 63*  ALT 12 15  ALKPHOS 51 49  BILITOT 2.9* 3.4*  PROT 8.7* 8.3*  ALBUMIN 5.2* 4.9   No results for input(s): LIPASE, AMYLASE in the last 168 hours. Recent Labs  Lab  07/18/18 1433  AMMONIA <9*   Coagulation Profile: No results for input(s): INR, PROTIME in the last 168 hours. Cardiac Enzymes: Recent Labs  Lab 07/18/18 1434 07/18/18 1750 07/18/18 1946  CKTOTAL 2,958*  --  2,887*  TROPONINI  --  <0.03  --    BNP (last 3 results) No results for input(s): PROBNP in the last 8760 hours. HbA1C: No results for input(s): HGBA1C in the last 72 hours. CBG: Recent Labs  Lab 07/18/18 1508 07/18/18 1621 07/18/18 1919 07/18/18 2155  GLUCAP 53* 69* 94 65*   Lipid Profile: No results for input(s): CHOL, HDL, LDLCALC, TRIG, CHOLHDL, LDLDIRECT in the last 72 hours. Thyroid Function Tests: Recent Labs    07/18/18 1434  TSH 0.557   Anemia Panel: Recent Labs    07/18/18 1434  VITAMINB12 340  FOLATE 9.3   Urine analysis:    Component Value Date/Time   COLORURINE YELLOW 07/18/2018 2124   APPEARANCEUR CLEAR 07/18/2018 2124   LABSPEC 1.018 07/18/2018 2124   PHURINE 5.0 07/18/2018 2124   GLUCOSEU 50 (A) 07/18/2018 2124   HGBUR SMALL (A) 07/18/2018 2124   BILIRUBINUR NEGATIVE 07/18/2018 2124   KETONESUR 80 (A)  07/18/2018 2124   PROTEINUR NEGATIVE 07/18/2018 2124   NITRITE NEGATIVE 07/18/2018 2124   LEUKOCYTESUR NEGATIVE 07/18/2018 2124   Sepsis Labs: @LABRCNTIP (procalcitonin:4,lacticidven:4) )No results found for this or any previous visit (from the past 240 hour(s)).   Radiological Exams on Admission: Dg Chest 1 View  Result Date: 07/18/2018 CLINICAL DATA:  Possible nipple shadow projecting over the left lung base. EXAM: CHEST  1 VIEW COMPARISON:  Earlier today at 1437 hours. FINDINGS: 1953 hours. The patient is rotated minimally left. Midline trachea. Normal heart size. No pleural effusion or pneumothorax. Nodular density described on the prior radiograph corresponds to the nipple shadow. Clear lungs. IMPRESSION: No acute cardiopulmonary disease. The prior plain film abnormality was secondary to a nipple shadow. Electronically Signed   By: Jeronimo Greaves M.D.   On: 07/18/2018 20:13   Dg Chest Port 1 View  Result Date: 07/18/2018 CLINICAL DATA:  Seizure.  Fall. EXAM: PORTABLE CHEST 1 VIEW COMPARISON:  July 16, 2018 FINDINGS: The heart, hila, mediastinum, and pleura are normal. A rounded density projected over the lateral left lung base is probably a nipple shadow, not seen on the study from July 16, 2018. No other acute abnormalities are identified. IMPRESSION: Probable nipple shadow over the lateral left lung base. Recommend repeat imaging with nipple markers. No acute abnormalities otherwise seen. Electronically Signed   By: Gerome Sam III M.D   On: 07/18/2018 18:26    EKG: Independently reviewed.  Normal sinus rhythm.  Assessment/Plan Principal Problem:   Hypoglycemia Active Problems:   Severe episode of recurrent major depressive disorder, with psychotic features (HCC)   Rhabdomyolysis    1. Hypoglycemia likely from poor oral intake and patient is on D5 normal saline.  Closely follow CBGs until patient can eat reliably and at that time can discontinue D5  normal. 2. Rhabdomyolysis could be from recent fall and also could been from non-ambulating -gently hydrate follow CK levels. 3. Recent trauma to the face -with CAT scan showing Fracture through the anterior maxillary alveolus with avulsionand superior/anterior displacement of the medial incisor. Overlyingsoft tissue swelling with small retained bone fragments.  We will continue patient on penicillin for now.  Will need outpatient follow-up with Dr. Barbette Merino. 4. Presented 2 days ago with possible seizure for which neurology recommended to be on Keppra which has  been placed on IV for now.  Will check EEG.  Patient did not seems to be having active seizures since patient does follow at times commands but does not communicate. 5. Schizophrenia -being evaluated by behavioral health and has been placed on Cogentin and Haldol which are his home medications.   DVT prophylaxis: Lovenox. Code Status: Full code. Family Communication: No family at the bedside. Disposition Plan: To be determined. Consults called: None. Admission status: Observation.   Eduard Clos MD [facility] Pager [phone].  If 7PM-7AM, please contact night-coverage [email or web address] Password [email or web address]  07/18/2018, 11:18 PM

## 2018-07-18 NOTE — BH Assessment (Signed)
[facility] Assessment Progress Note  Case was staffed with Parks FNP who recommended a inpatient admission to assist with stabilization.     

## 2018-07-18 NOTE — ED Triage Notes (Signed)
Transported by [facility] from home-- patient has been battling psychosis for the last year. Seen at [facility] 2 days ago, had a seizure and experienced a fall. Evaluated and discharged and started on Keppra. AAO x 2 upon arrival. Per EMS patient is in a catatonic state.

## 2018-07-18 NOTE — ED Provider Notes (Signed)
  Face-to-face evaluation   History: Patient presents by EMS, history unclear.  Discharged home ED at [facility], 3 days ago after treatment for apparent seizure.  He had facial and dental trauma that was repaired.  Physical exam: Patient alert, cooperative, nonverbal.  Heart regular rate and rhythm without murmur lungs clear to auscultation.  Extremities are without deformity.  No resistance to movement of the arms or legs.  He does not cooperate with strength testing.  Abdomen with apparent functioning bowel ostomy, brown stool in bag.  Well-healed vertical surgical wound.  Medical screening examination/treatment/procedure(s) were conducted as a shared visit with non-physician practitioner(s) and myself.  I personally evaluated the patient during the encounter    Mancel Bale, MD 07/19/18 1526
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
## 2018-07-18 NOTE — ED Notes (Signed)
Bed: WA21 Expected date:  Expected time:  Means of arrival:  Comments: AMS 

## 2018-07-19 DIAGNOSIS — G40919 Epilepsy, unspecified, intractable, without status epilepticus: Secondary | ICD-10-CM | POA: Diagnosis present

## 2018-07-19 DIAGNOSIS — R410 Disorientation, unspecified: Secondary | ICD-10-CM

## 2018-07-19 DIAGNOSIS — R338 Other retention of urine: Secondary | ICD-10-CM | POA: Diagnosis present

## 2018-07-19 DIAGNOSIS — F333 Major depressive disorder, recurrent, severe with psychotic symptoms: Secondary | ICD-10-CM

## 2018-07-19 DIAGNOSIS — W19XXXS Unspecified fall, sequela: Secondary | ICD-10-CM

## 2018-07-19 DIAGNOSIS — S0292XS Unspecified fracture of facial bones, sequela: Secondary | ICD-10-CM

## 2018-07-19 DIAGNOSIS — R4182 Altered mental status, unspecified: Secondary | ICD-10-CM | POA: Diagnosis present

## 2018-07-19 LAB — BASIC METABOLIC PANEL
ANION GAP: 10 (ref 5–15)
BUN: <5 mg/dL — ABNORMAL LOW (ref 6–20)
CO2: 23 mmol/L (ref 22–32)
Calcium: 9.5 mg/dL (ref 8.9–10.3)
Chloride: 107 mmol/L (ref 98–111)
Creatinine, Ser: 0.65 mg/dL (ref 0.61–1.24)
GFR calc Af Amer: >60 mL/min (ref >60)
GFR calc non Af Amer: >60 mL/min (ref >60)
GLUCOSE: 87 mg/dL (ref 70–99)
Potassium: 4 mmol/L (ref 3.5–5.1)
Sodium: 140 mmol/L (ref 135–145)

## 2018-07-19 LAB — CBC
HCT: 41.8 % (ref 39.0–52.0)
Hemoglobin: 13.5 g/dL (ref 13.0–17.0)
MCH: 29.9 pg (ref 26.0–34.0)
MCHC: 32.3 g/dL (ref 30.0–36.0)
MCV: 92.7 fL (ref 80.0–100.0)
Platelets: 276 10*3/uL (ref 150–400)
RBC: 4.51 MIL/uL (ref 4.22–5.81)
RDW: 15.9 % — ABNORMAL HIGH (ref 11.5–15.5)
WBC: 9 10*3/uL (ref 4.0–10.5)
nRBC: 0 % (ref 0.0–0.2)

## 2018-07-19 LAB — GLUCOSE, CAPILLARY
Glucose-Capillary: 71 mg/dL (ref 70–99)
Glucose-Capillary: 104 mg/dL — ABNORMAL HIGH (ref 70–99)
Glucose-Capillary: 88 mg/dL (ref 70–99)
Glucose-Capillary: 99 mg/dL (ref 70–99)
Glucose-Capillary: 90 mg/dL (ref 70–99)
Glucose-Capillary: 111 mg/dL — ABNORMAL HIGH (ref 70–99)

## 2018-07-19 LAB — CBG MONITORING, ED
GLUCOSE-CAPILLARY: 87 mg/dL (ref 70–99)
Glucose-Capillary: 66 mg/dL — ABNORMAL LOW (ref 70–99)
Glucose-Capillary: 74 mg/dL (ref 70–99)
Glucose-Capillary: 79 mg/dL (ref 70–99)
Glucose-Capillary: 83 mg/dL (ref 70–99)
Glucose-Capillary: 83 mg/dL (ref 70–99)
Glucose-Capillary: 87 mg/dL (ref 70–99)
Glucose-Capillary: 88 mg/dL (ref 70–99)

## 2018-07-19 LAB — HEPATIC FUNCTION PANEL
ALT: 12 U/L (ref 0–44)
AST: 53 U/L — ABNORMAL HIGH (ref 15–41)
Albumin: 4.5 g/dL (ref 3.5–5.0)
Alkaline Phosphatase: 42 U/L (ref 38–126)
BILIRUBIN INDIRECT: 3 mg/dL — AB (ref 0.3–0.9)
Bilirubin, Direct: 0.4 mg/dL — ABNORMAL HIGH (ref 0.0–0.2)
Total Bilirubin: 3.4 mg/dL — ABNORMAL HIGH (ref 0.3–1.2)
Total Protein: 7.9 g/dL (ref 6.5–8.1)

## 2018-07-19 LAB — AMMONIA: Ammonia: 23 umol/L (ref 9–35)

## 2018-07-19 LAB — CK
Total CK: 3397 U/L — ABNORMAL HIGH (ref 49–397)
Total CK: 2950 U/L — ABNORMAL HIGH (ref 49–397)
Total CK: 2880 U/L — ABNORMAL HIGH (ref 49–397)

## 2018-07-19 LAB — TROPONIN I: Troponin I: <0.03 ng/mL (ref <0.03)

## 2018-07-19 LAB — MAGNESIUM: Magnesium: 1.9 mg/dL (ref 1.7–2.4)

## 2018-07-19 LAB — MRSA PCR SCREENING: MRSA by PCR: NEGATIVE

## 2018-07-19 MED ORDER — LORAZEPAM 2 MG/ML IJ SOLN: 1.0000 mg | INTRAMUSCULAR | Status: DC | PRN

## 2018-07-19 MED ORDER — CHLORHEXIDINE GLUCONATE 0.12 % MT SOLN
15.0000 mL | Freq: Two times a day (BID) | OROMUCOSAL | Status: DC
  Administered 2018-07-19 – 2018-07-20 (×3): 15 mL via OROMUCOSAL
  Filled 2018-07-19 (×3): qty 15

## 2018-07-19 MED ORDER — ORAL CARE MOUTH RINSE: 15.0000 mL | Freq: Two times a day (BID) | OROMUCOSAL | Status: DC

## 2018-07-19 MED ORDER — SODIUM CHLORIDE 0.9 % IV SOLN
3.0000 g | Freq: Four times a day (QID) | INTRAVENOUS | Status: DC
  Administered 2018-07-19 – 2018-07-21 (×9): 3 g via INTRAVENOUS
  Filled 2018-07-19 (×11): qty 3

## 2018-07-19 MED ORDER — ENOXAPARIN SODIUM 40 MG/0.4ML ~~LOC~~ SOLN
40.0000 mg | SUBCUTANEOUS | Status: DC
  Administered 2018-07-19 – 2018-07-20 (×2): 40 mg via SUBCUTANEOUS
  Filled 2018-07-19 (×2): qty 0.4
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
## 2018-07-19 NOTE — Progress Notes (Signed)
Pharmacy Antibiotic Note  Peter Golden is a 38 y.o. male admitted on 07/18/2018 with dental infection.  Pharmacy has been consulted for Unasyn dosing.  Plan: Unasyn 3 gr IV q6h   Monitor clinical course, renal function, cultures as available CrCl > 100 ml/min based on weight of 68.9 kg from 05/08/2018     Dosage will likely remain stable at above dose and need for further dosage adjustment appears unlikely at present.    Will sign off at this time.  Please reconsult if a change in clinical status warrants re-evaluation of dosage.         Temp (24hrs), Avg:99.7 F (37.6 C), Min:99.7 F (37.6 C), Max:99.7 F (37.6 C)  Recent Labs  Lab 07/16/18 1511 07/18/18 1345 07/19/18 0300  WBC 10.2 9.6 9.0  CREATININE 1.51* 0.91 0.65    CrCl cannot be calculated (Unknown ideal weight.).    No Known Allergies  Antimicrobials this admission: 1/19 Unasyn >>     Dose adjustments this admission:    Microbiology results:    Thank you for allowing pharmacy to be a part of this patient's care.   Adalberto Cole, PharmD, BCPS Pager [phone] 07/19/2018 7:23 AM
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
## 2018-07-20 ENCOUNTER — Other Ambulatory Visit: Payer: Self-pay

## 2018-07-20 LAB — GLUCOSE, CAPILLARY
Glucose-Capillary: 83 mg/dL (ref 70–99)
Glucose-Capillary: 71 mg/dL (ref 70–99)
Glucose-Capillary: 111 mg/dL — ABNORMAL HIGH (ref 70–99)
Glucose-Capillary: 92 mg/dL (ref 70–99)
Glucose-Capillary: 105 mg/dL — ABNORMAL HIGH (ref 70–99)

## 2018-07-20 LAB — CK: Total CK: 3069 U/L — ABNORMAL HIGH (ref 49–397)

## 2018-07-20 MED ORDER — LIP MEDEX EX OINT
TOPICAL_OINTMENT | CUTANEOUS | Status: DC | PRN
  Filled 2018-07-20: qty 7

## 2018-07-20 NOTE — Progress Notes (Signed)
PROGRESS NOTE    Peter Golden  [id number] DOB: 06/20/1981 DOA: 07/18/2018 PCP: Patient, No Pcp Per    Brief Narrative: 38 year old [place] speaking male but also speaks English with past medical history relevant for schizo affective schizophrenia, seizures who was admitted after recent seizure and facial fractures with rhabdomyolysis after being found not moving in his chair and nonverbal with hypoglycemia and urinary retention.  [facility]   Assessment & Plan:   Principal Problem:   Hypoglycemia Active Problems:   Severe episode of recurrent major depressive disorder, with psychotic features (HCC)   Rhabdomyolysis   Breakthrough seizure (HCC)   AMS (altered mental status)   Acute urinary retention   Facial fracture due to fall, sequela (HCC)   #) Hypoglycemia: Patient is more awake and alert today.  His blood sugars and are doing somewhat better. -Continue D10 infusion, will wean -Speech and swallow eval  #) Rhabdomyolysis: Patient CPK is been relatively stable.  His creatinine never increased.  He is on aggressive IV hydration.  At this time it is unclear if his rhabdomyolysis was related to poor p.o. intake.  It is unclear if his altered mental status along with his hypoglycemia and rhabdomyolysis are signs of an underlying metabolic disorder such as glycogen storage disorder -Continue IV fluids  #) Altered mental status: He does understand and communicate in English but is relatively reticent to communicate.  It is unclear if this is related to his underlying psychiatric issues.  #) Facial fracture due to trauma: -Patient will follow-up with Dr. Barbette MerinoJensen as an outpatient -Continue IV Unasyn, patient was discharged on p.o. cephalexin  #) Acute urinary retention: Foley catheter was placed.  He does not have any signs or symptoms of spinal cord compression. -Discontinue Foley when tolerated  #) Seizure disorder: MRI  during ER visit on 07/16/2018 was negative -Continue levetiracetam 500 mg twice daily, will transition to p.o. once patient is taking p.o.  #) Schizophrenia: Patient apparently was being evaluated by behavioral health and [facility] after he is medically stable -Continue benztropine and haloperidol  Fluids: Per above Electrolytes: Monitor and supplement Nutrition: Regular diet  Prophylaxis: Enoxaparin   Disposition: Pending resolution of rhabdomyolysis and hyperglycemia  Full code    Consultants:   Psychiatry  Procedures:   None  Antimicrobials:   IV Unasyn started 07/19/2018   Subjective: This morning patient does respond only to questions asking if he is feeling okay in response that he is not in any pain.  He does not respond to any other questions.  Objective: Vitals:   07/20/18 0500 07/20/18 0600 07/20/18 0700 07/20/18 0800  BP: 114/75 127/82 128/80 128/63  Pulse: (!) 48 (!) 47 (!) 55 (!) 48  Resp: 16 12 14 11   Temp:      TempSrc:      SpO2: 100% 99% 100% 100%  Weight:      Height:        Intake/Output Summary (Last 24 hours) at 07/20/2018 0932 Last data filed at 07/20/2018 0745 Gross per 24 hour  Intake 1904.59 ml  Output 2600 ml  Net -695.41 ml   Filed Weights   07/19/18 1407  Weight: 62.6 kg    Examination:  General exam: No acute distress Respiratory system: Clear to auscultation. Respiratory effort normal. Cardiovascular system: Regular rate and rhythm, no murmurs Gastrointestinal system: Abdomen is nondistended, soft and nontender. No organomegaly or masses felt. Normal bowel sounds heard. Central nervous  system: Alert but does not appear to answer orientation questions, grossly intact, moving all extremities Extremities: No lower extremity edema Skin: Facial contusions, upper lip swelling Psychiatry: Judgement and insight appear normal. Mood & affect appropriate.     Data Reviewed: I have personally reviewed following labs and  imaging studies  CBC: Recent Labs  Lab 07/16/18 1511 07/18/18 1345 07/19/18 0300  WBC 10.2 9.6 9.0  NEUTROABS 8.3*  --   --   HGB 14.5 14.4 13.5  HCT 44.6 43.4 41.8  MCV 90.1 91.2 92.7  PLT 297 282 276   Basic Metabolic Panel: Recent Labs  Lab 07/16/18 1511 07/18/18 1345 07/18/18 2315 07/19/18 0300  NA 138 141  --  140  K 4.0 3.2*  --  4.0  CL 101 102  --  107  CO2 14* 24  --  23  GLUCOSE 197* 100*  --  87  BUN 11 5*  --  <5*  CREATININE 1.51* 0.91  --  0.65  CALCIUM 10.4* 9.8  --  9.5  MG  --   --  1.9  --    GFR: Estimated Creatinine Clearance: 114.1 mL/min (by C-G formula based on SCr of 0.65 mg/dL). Liver Function Tests: Recent Labs  Lab 07/16/18 1511 07/18/18 1345 07/19/18 0300  AST 31 63* 53*  ALT 12 15 12   ALKPHOS 51 49 42  BILITOT 2.9* 3.4* 3.4*  PROT 8.7* 8.3* 7.9  ALBUMIN 5.2* 4.9 4.5   No results for input(s): LIPASE, AMYLASE in the last 168 hours. Recent Labs  Lab 07/18/18 1433 07/19/18 0300  AMMONIA <9* 23   Coagulation Profile: No results for input(s): INR, PROTIME in the last 168 hours. Cardiac Enzymes: Recent Labs  Lab 07/18/18 1750 07/18/18 1946 07/18/18 2315 07/19/18 0300 07/19/18 1340 07/19/18 1902 07/20/18 0126  CKTOTAL  --  2,887*  --  3,397* 2,950* 2,880* 3,069*  TROPONINI <0.03  --  <0.03  --   --   --   --    BNP (last 3 results) No results for input(s): PROBNP in the last 8760 hours. HbA1C: No results for input(s): HGBA1C in the last 72 hours. CBG: Recent Labs  Lab 07/19/18 1510 07/19/18 1626 07/19/18 1710 07/19/18 2134 07/20/18 0734  GLUCAP 88 99 90 111* 71   Lipid Profile: No results for input(s): CHOL, HDL, LDLCALC, TRIG, CHOLHDL, LDLDIRECT in the last 72 hours. Thyroid Function Tests: Recent Labs    07/18/18 1434  TSH 0.557   Anemia Panel: Recent Labs    07/18/18 1434  VITAMINB12 340  FOLATE 9.3   Sepsis Labs: No results for input(s): PROCALCITON, LATICACIDVEN in the last 168  hours.  Recent Results (from the past 240 hour(s))  MRSA PCR Screening     Status: None   Collection Time: 07/19/18  1:08 PM  Result Value Ref Range Status   MRSA by PCR NEGATIVE NEGATIVE Final    Comment:        The GeneXpert MRSA Assay (FDA approved for NASAL specimens only), is one component of a comprehensive MRSA colonization surveillance program. It is not intended to diagnose MRSA infection nor to guide or monitor treatment for MRSA infections. Performed at [facility], [address] [place], [place] [address]          Radiology Studies: Dg Chest 1 View  Result Date: 07/18/2018 CLINICAL DATA:  Possible nipple shadow projecting over the left lung base. EXAM: CHEST  1 VIEW COMPARISON:  Earlier today at [employer]  hours. FINDINGS: 1953 hours. The patient is rotated minimally left. Midline trachea. Normal heart size. No pleural effusion or pneumothorax. Nodular density described on the prior radiograph corresponds to the nipple shadow. Clear lungs. IMPRESSION: No acute cardiopulmonary disease. The prior plain film abnormality was secondary to a nipple shadow. Electronically Signed   By: Jeronimo Greaves M.D.   On: 07/18/2018 20:13   Dg Chest Port 1 View  Result Date: 07/18/2018 CLINICAL DATA:  Seizure.  Fall. EXAM: PORTABLE CHEST 1 VIEW COMPARISON:  July 16, 2018 FINDINGS: The heart, hila, mediastinum, and pleura are normal. A rounded density projected over the lateral left lung base is probably a nipple shadow, not seen on the study from July 16, 2018. No other acute abnormalities are identified. IMPRESSION: Probable nipple shadow over the lateral left lung base. Recommend repeat imaging with nipple markers. No acute abnormalities otherwise seen. Electronically Signed   By: Gerome Sam III M.D   On: 07/18/2018 18:26        Scheduled Meds: . benztropine  0.5 mg Oral BID  . chlorhexidine  15 mL Mouth Rinse BID  . enoxaparin (LOVENOX) injection  40 mg  Subcutaneous Q24H  . haloperidol  5 mg Oral BID  . mouth rinse  15 mL Mouth Rinse q12n4p   Continuous Infusions: . ampicillin-sulbactam (UNASYN) IV Stopped (07/20/18 4680)  . dextrose 5 % and 0.9% NaCl 100 mL/hr at 07/19/18 1600  . levETIRAcetam Stopped (07/20/18 0024)     LOS: 0 days    Time spent: 35    Delaine Lame, MD [facility]  If 7PM-7AM, please contact night-coverage [email or web address] Password [facility] 07/20/2018, 9:32 AM

## 2018-07-20 NOTE — Consult Note (Signed)
WOC Nurse wound consult note Reason for Consult: facial lacerations, however no open wounds. Patient experienced a fall around 07/16/18 and was treated in the ED.  Lacerations repaired. Dental trauma noted, to see oral surgeon for this. Was readmitted 07/18/18 with altered mental status. Has sitter in the ICU Wound type: lacerations oral with trauma to lips and face  Dressing procedure/placement/frequency: lacerations are stable.  Follow up with primary care as outpatient. No topical care needed  WOC Nurse ostomy consult note Stoma type/location: transverse colostomy Patient known to this WOC nurse from his previous admission. Stomal assessment/size: did not assess today, patient with altered mental status  Bedside nurses to assist with care, supplies are on the department for use.  Ostomy pouching: 2pc.  Patient is independent in the care of his stoma.    Discussed POC with bedside nurse.  Re consult if needed, will not follow at this time. Thanks  Abdalrahman Clementson [employer], RN,CWOCN, CNS, CWON-AP [phone])
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
## 2018-07-21 LAB — BASIC METABOLIC PANEL
Anion gap: 6 (ref 5–15)
BUN: <5 mg/dL — ABNORMAL LOW (ref 6–20)
CO2: 26 mmol/L (ref 22–32)
Calcium: 8.8 mg/dL — ABNORMAL LOW (ref 8.9–10.3)
Chloride: 107 mmol/L (ref 98–111)
Creatinine, Ser: 0.64 mg/dL (ref 0.61–1.24)
GFR calc Af Amer: >60 mL/min (ref >60)
GFR calc non Af Amer: >60 mL/min (ref >60)
Glucose, Bld: 103 mg/dL — ABNORMAL HIGH (ref 70–99)
Potassium: 3.1 mmol/L — ABNORMAL LOW (ref 3.5–5.1)
Sodium: 139 mmol/L (ref 135–145)

## 2018-07-21 LAB — GLUCOSE, CAPILLARY: Glucose-Capillary: 95 mg/dL (ref 70–99)

## 2018-07-21 LAB — CBC
HCT: 38.6 % — ABNORMAL LOW (ref 39.0–52.0)
Hemoglobin: 12.7 g/dL — ABNORMAL LOW (ref 13.0–17.0)
MCH: 29.8 pg (ref 26.0–34.0)
MCHC: 32.9 g/dL (ref 30.0–36.0)
MCV: 90.6 fL (ref 80.0–100.0)
Platelets: 291 10*3/uL (ref 150–400)
RBC: 4.26 MIL/uL (ref 4.22–5.81)
RDW: 15.4 % (ref 11.5–15.5)
WBC: 5.6 10*3/uL (ref 4.0–10.5)
nRBC: 0 % (ref 0.0–0.2)

## 2018-07-21 LAB — CK: Total CK: 2839 U/L — ABNORMAL HIGH (ref 49–397)

## 2018-07-21 LAB — MAGNESIUM: Magnesium: 1.7 mg/dL (ref 1.7–2.4)

## 2018-07-21 MED ORDER — HALOPERIDOL 5 MG PO TABS: 5.0000 mg | ORAL_TABLET | Freq: Two times a day (BID) | ORAL | 0 refills | Status: DC

## 2018-07-21 MED ORDER — BENZTROPINE MESYLATE 0.5 MG PO TABS: 0.5000 mg | ORAL_TABLET | Freq: Two times a day (BID) | ORAL | 0 refills | Status: DC

## 2018-07-21 MED ORDER — POTASSIUM CHLORIDE CRYS ER 20 MEQ PO TBCR
40.0000 meq | EXTENDED_RELEASE_TABLET | ORAL | Status: AC
  Administered 2018-07-21: 40 meq via ORAL
  Filled 2018-07-21: qty 2

## 2018-07-21 NOTE — Care Management Note (Signed)
Case Management Note  Patient Details  Name: Peter Golden MRN: [id number] Date of Birth: 1980-09-24  Subjective/Objective:                  discharged  Action/Plan: Discharged to home with self-care and family support Orders checked for hhc needs. No CM needs present at time of discharge. Patient prefers to make own follow appointments has needed.  Expected Discharge Date:  07/21/18               Expected Discharge Plan:  Home/Self Care  In-House Referral:     Discharge planning Services  CM Consult  Post Acute Care Choice:    Choice offered to:     DME Arranged:    DME Agency:     HH Arranged:    HH Agency:     Status of Service:  Completed, signed off  If discussed at [employer] of Stay Meetings, dates discussed:    Additional Comments:  Golda Acre, RN 07/21/2018, 9:43 AM

## 2018-07-21 NOTE — Progress Notes (Addendum)
LCSW faxed change in commitment form to magistrate.   LCSW provided bus pass for transport.   Per MD request, LCSW provided outpatient therapy resources to patient. LCSW explained resources.   Beulah Gandy [place] CSW [phone]
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
## 2018-07-21 NOTE — Discharge Instructions (Signed)
Rhabdomyolysis  Rhabdomyolysis is a condition that happens when muscle cells break down and release substances into the blood that can damage the kidneys. This condition happens because of damage to the muscles that move bones (skeletal muscle). When the skeletal muscles are damaged, substances inside the muscle cells go into the blood. One of these substances is a protein called myoglobin.  Large amounts of myoglobin can cause kidney damage or kidney failure. Other substances that are released by muscle cells may upset the balance of the minerals (electrolytes) in your blood. This imbalance causes your blood to have too much acid (acidosis).  What are the causes?  This condition is caused by muscle damage. Muscle damage often happens because of:   Using your muscles too much.   An injury that crushes or squeezes a muscle too tightly.   Using illegal drugs, mainly cocaine.   Alcohol abuse.  Other possible causes include:   Prescription medicines, such as those that:  ? Lower cholesterol (statins).  ? Treat ADHD (attention deficit hyperactivity disorder) or help with weight loss (amphetamines).  ? Treat pain (opiates).   Infections.   Muscle diseases that are passed down from parent to child (inherited).   High fever.   Heatstroke.   Not having enough fluids in your body (dehydration).   Seizures.   Surgery.  What increases the risk?  This condition is more likely to develop in people who:   Have a family history of muscle disease.   Take part in extreme sports, such as running in marathons.   Have diabetes.   Are older.   Abuse drugs or alcohol.  What are the signs or symptoms?  Symptoms of this condition vary. Some people have very few symptoms, and other people have many symptoms. The most common symptoms include:   Muscle pain and swelling.   Weak muscles.   Dark urine.   Feeling weak and tired.  Other symptoms include:   Nausea and vomiting.   Fever.   Pain in the abdomen.   Pain in the  joints.  Symptoms of complications from this condition include:   Heart rhythm that is not normal (arrhythmia).   Seizures.   Not urinating enough because of kidney failure.   Very low blood pressure (shock). Signs of shock include dizziness, blurry vision, and clammy skin.   Bleeding that is hard to stop or control.  How is this diagnosed?  This condition is diagnosed based on your medical history, your symptoms, and a physical exam. Tests may also be done, including:   Blood tests.   Urine tests to check for myoglobin.  You may also have other tests to check for causes of muscle damage and to check for complications.  How is this treated?  Treatment for this condition helps to:   Make sure you have enough fluids in your body.   Lower the acid levels in your blood to reverse acidosis.   Protect your kidneys.  Treatment may include:   Fluids and medicines given through an IV tube that is inserted into one of your veins.   Medicines to lower acidosis or to bring back the balance of the minerals in your body.   Hemodialysis. This treatment uses an artificial kidney machine to filter your blood while you recover. You may have this if other treatments are not helping.  Follow these instructions at home:     Take over-the-counter and prescription medicines only as told by your health care provider.     Rest at home until your health care provider says that you can return to your normal activities.   Drink enough fluid to keep your urine clear or pale yellow.   Do not do activities that take a lot of effort (are strenuous). Ask your health care provider what level of exercise is safe for you.   Do not abuse drugs or alcohol. If you are having problems with drug or alcohol use, ask your health care provider for help.   Keep all follow-up visits as told by your health care provider. This is important.  Contact a health care provider if:   You start having symptoms of this condition after treatment.  Get  help right away if:   You have a seizure.   You bleed easily or cannot control bleeding.   You cannot urinate.   You have chest pain.   You have trouble breathing.  This information is not intended to replace advice given to you by your health care provider. Make sure you discuss any questions you have with your health care provider.  Document Released: 05/30/2004 Document Revised: 03/29/2016 Document Reviewed: 03/29/2016  Elsevier Interactive Patient Education  2019 Elsevier Inc.

## 2018-08-29 ENCOUNTER — Emergency Department (HOSPITAL_COMMUNITY): Payer: Self-pay

## 2018-08-29 ENCOUNTER — Emergency Department (HOSPITAL_COMMUNITY)
Admission: EM | Admit: 2018-08-29 | Discharge: 2018-09-02 | Disposition: A | Discharge status: Other Acute Inpatient Hospital | Payer: Self-pay | Attending: Emergency Medicine | Admitting: Emergency Medicine

## 2018-08-29 ENCOUNTER — Encounter (HOSPITAL_COMMUNITY): Payer: Self-pay | Admitting: *Deleted

## 2018-08-29 DIAGNOSIS — F29 Unspecified psychosis not due to a substance or known physiological condition: Secondary | ICD-10-CM

## 2018-08-29 DIAGNOSIS — R4182 Altered mental status, unspecified: Secondary | ICD-10-CM | POA: Insufficient documentation

## 2018-08-29 DIAGNOSIS — Z79899 Other long term (current) drug therapy: Secondary | ICD-10-CM | POA: Insufficient documentation

## 2018-08-29 DIAGNOSIS — F251 Schizoaffective disorder, depressive type: Secondary | ICD-10-CM | POA: Diagnosis present

## 2018-08-29 LAB — CBC WITH DIFFERENTIAL/PLATELET
Abs Immature Granulocytes: 0.01 10*3/uL (ref 0.00–0.07)
Basophils Absolute: 0 10*3/uL (ref 0.0–0.1)
Basophils Relative: 0 %
Eosinophils Absolute: 0 10*3/uL (ref 0.0–0.5)
Eosinophils Relative: 0 %
HCT: 42.2 % (ref 39.0–52.0)
Hemoglobin: 14.1 g/dL (ref 13.0–17.0)
Immature Granulocytes: 0 %
Lymphocytes Relative: 24 %
Lymphs Abs: 1.7 10*3/uL (ref 0.7–4.0)
MCH: 30.1 pg (ref 26.0–34.0)
MCHC: 33.4 g/dL (ref 30.0–36.0)
MCV: 90.2 fL (ref 80.0–100.0)
MONO ABS: 0.7 10*3/uL (ref 0.1–1.0)
Monocytes Relative: 10 %
Neutro Abs: 4.6 10*3/uL (ref 1.7–7.7)
Neutrophils Relative %: 66 %
Platelets: 293 10*3/uL (ref 150–400)
RBC: 4.68 MIL/uL (ref 4.22–5.81)
RDW: 17.2 % — ABNORMAL HIGH (ref 11.5–15.5)
WBC: 7.1 10*3/uL (ref 4.0–10.5)
nRBC: 0 % (ref 0.0–0.2)

## 2018-08-29 LAB — COMPREHENSIVE METABOLIC PANEL
ALT: 11 U/L (ref 0–44)
AST: 26 U/L (ref 15–41)
Albumin: 5.1 g/dL — ABNORMAL HIGH (ref 3.5–5.0)
Alkaline Phosphatase: 53 U/L (ref 38–126)
Anion gap: 12 (ref 5–15)
BUN: 9 mg/dL (ref 6–20)
CO2: 26 mmol/L (ref 22–32)
Calcium: 9.8 mg/dL (ref 8.9–10.3)
Chloride: 100 mmol/L (ref 98–111)
Creatinine, Ser: 0.99 mg/dL (ref 0.61–1.24)
GFR calc Af Amer: >60 mL/min (ref >60)
Glucose, Bld: 118 mg/dL — ABNORMAL HIGH (ref 70–99)
Potassium: 3.2 mmol/L — ABNORMAL LOW (ref 3.5–5.1)
Sodium: 138 mmol/L (ref 135–145)
Total Bilirubin: 3.4 mg/dL — ABNORMAL HIGH (ref 0.3–1.2)
Total Protein: 8.2 g/dL — ABNORMAL HIGH (ref 6.5–8.1)

## 2018-08-29 LAB — ETHANOL: Alcohol, Ethyl (B): <10 mg/dL (ref <10)

## 2018-08-29 LAB — CK: Total CK: 188 U/L (ref 49–397)

## 2018-08-29 LAB — AMMONIA: Ammonia: 37 umol/L — ABNORMAL HIGH (ref 9–35)

## 2018-08-29 MED ORDER — ZIPRASIDONE MESYLATE 20 MG IM SOLR
10.0000 mg | Freq: Once | INTRAMUSCULAR | Status: AC
  Administered 2018-08-29: 10 mg via INTRAMUSCULAR
  Filled 2018-08-29: qty 20

## 2018-08-29 MED ORDER — SODIUM CHLORIDE 0.9 % IV BOLUS
1000.0000 mL | Freq: Once | INTRAVENOUS | Status: AC
  Administered 2018-08-29: 1000 mL via INTRAVENOUS

## 2018-08-29 MED ORDER — LORAZEPAM 2 MG/ML IJ SOLN
1.0000 mg | Freq: Once | INTRAMUSCULAR | Status: AC
  Administered 2018-08-29: 1 mg via INTRAMUSCULAR
  Filled 2018-08-29: qty 1

## 2018-08-29 MED ORDER — STERILE WATER FOR INJECTION IJ SOLN
2.0000 mL | Freq: Once | INTRAMUSCULAR | Status: AC
  Administered 2018-08-29: 2 mL via INTRAMUSCULAR

## 2018-08-29 MED ORDER — STERILE WATER FOR INJECTION IJ SOLN
INTRAMUSCULAR | Status: AC
  Filled 2018-08-29: qty 10

## 2018-08-29 NOTE — [facility] Counselor (Signed)
Pt did not give clinician consent to contact family, friend supports to obtain collateral information. Pt did not respond when asked.   Redmond Pulling, MS, [facility], [facility] Triage Specialist [phone]
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
## 2018-08-29 NOTE — ED Notes (Signed)
IV attempted. Patient became agitated.

## 2018-08-29 NOTE — ED Triage Notes (Signed)
Police found the patient after his friend reported he was missing. The patient was found barefoot in the street. The patient knows English but is not talking in triage. The friend states the pt initially would talk to him but no longer responds.

## 2018-08-29 NOTE — BH Assessment (Addendum)
Assessment Note  Peter Golden is an 38 y.o. male, who presents voluntary and unaccompanied to [facility]. Per chart, pt was assessed on 07/18/2018 due to seizure-like activity observed by a bystander two days prior and the pt looked like he's not moved from the couch in two days." Per chart, pt was sent to the medical floor for three days. Pt was a poor historian and answered very few questions during the assessment. Clinician asked the pt to pronounce his name. Pt wanted clinician to pronounce his name. Clinician expressed she to the pt that she wanted him to do it so she will not mispronounce it. Pt then asked clinician to spell his name. Clinician spelled the pt's name and he agreed the spelling was correct. Clinician asked the pt for his birthday. The pt then asked clinician to spell, "September," write it down and show him. Clinician then asked the pt, "what brought you to the hospital?" Pt reported, "I'm sick." Clinician asked the pt to expound, however did not. Clinician observed the pt raise up in the hospital bed and say, "I wanna fight you." During the assessment the pt would become upset and yell and his heart rate would rise. Clinician expressed to the pt to take deep breaths to calm down as his heart rate is rising. Pt complied however he continued to yell. Pt asked clinician if she knew [place] and attempted to teach her phrases in [place].   Clinician was unable to assess the following: "living situation, linked to OPT resources, suicidal and homicidal ideations, stressful events, substance use, legal involvement, previous suicidal attempts, sleep, appetite, symptoms of depression, vegetative symptoms, access to weapons, self-injurious behaviors, ADLs, contract for safety, history of violence, highest grade completed, CPS and/or APS involvement."  Pt presents alert in restraints with aggressive, rapid speech. Pt's eye contact was good. Pt's mood was irritable, threatening. Pt's affect was  congruent with mood. Pt's thought process was thought blocking, circumstantial. Pt's judgement was impaired. Pt's concentration, insight and impulse control are poor. Pt reported, "I'm safe."   Diagnosis: Schizoaffective Disorder, depressive type.   Past Medical History:  Past Medical History:  Diagnosis Date  . Schizo affective schizophrenia (HCC)   . Seizures (HCC)     Past Surgical History:  Procedure Laterality Date  . IR THORACENTESIS ASP PLEURAL SPACE W/IMG GUIDE  04/22/2018  . LAPAROTOMY N/A 04/15/2018   Procedure: EXPLORATORY LAPAROTOMY with splenectomy, transverse colectomy, partial gastrectomy, repair of diaphragmatic hernia, left 14 Fr pigtail tube thoracostomy, repair of umbilical hernia;  Surgeon: Almond Lint, MD;  Location: [facility] OR;  Service: General;  Laterality: N/A;    Family History:  Family History  Problem Relation Age of Onset  . Mental illness Neg Hx     Social History:  reports that he has never smoked. He has never used smokeless tobacco. He reports previous alcohol use. He reports previous drug use.  Additional Social History:  Alcohol / Drug Use Pain Medications: See MAR Prescriptions: See MAR Over the Counter: See MAR History of alcohol / drug use?: (UTA)  CIWA: CIWA-Ar BP: 122/87 Pulse Rate: 91 COWS:    Allergies: No Known Allergies  Home Medications: (Not in a hospital admission)   OB/GYN Status:  No LMP for male patient.  General Assessment Data Location of Assessment: [facility] ED TTS Assessment: In system Is this a Tele or Face-to-Face Assessment?: Face-to-Face Is this an Initial Assessment or a Re-assessment for this encounter?: Initial Assessment Patient Accompanied by:: N/A Language Other than English:  Yes What is your preferred language: [place] Living Arrangements: Other (Comment)(Per chart, non-relative/friends.) What gender do you identify as?: Male Marital status: Single Living Arrangements: Non-relatives/Friends(Per chart.) Can  pt return to current living arrangement?: (UTA) Admission Status: Voluntary Is patient capable of signing voluntary admission?: Yes Referral Source: Self/Family/Friend Insurance type: Self-pay.      Crisis Care Plan Living Arrangements: Non-relatives/Friends(Per chart.) Legal Guardian: Other:(Self. ) Name of Psychiatrist: UTA Name of Therapist: UTA  Education Status Is patient currently in school?: (UTA)  Risk to self with the past 6 months Suicidal Ideation: (UTA.) Has patient been a risk to self within the past 6 months prior to admission? : (UTA.) Suicidal Intent: (UTA.) Has patient had any suicidal intent within the past 6 months prior to admission? : (UTA.) Is patient at risk for suicide?: (UTA.) Suicidal Plan?: (UTA.) Has patient had any suicidal plan within the past 6 months prior to admission? : (UTA.) Access to Means: (UTA.) What has been your use of drugs/alcohol within the last 12 months?: UDS is pending.  Previous Attempts/Gestures: (UTA.) How many times?: (UTA) Other Self Harm Risks: UTA. Triggers for Past Attempts: (UTA.) Intentional Self Injurious Behavior: (UTA.) Family Suicide History: Unable to assess Recent stressful life event(s): (UTA.) Persecutory voices/beliefs?: Rich Reining(UTA.) Depression: (UTA.) Substance abuse history and/or treatment for substance abuse?: (UTA.) Suicide prevention information given to non-admitted patients: (UTA.)  Risk to Others within the past 6 months Homicidal Ideation: (UTA.) Does patient have any lifetime risk of violence toward others beyond the six months prior to admission? : Yes (comment)(Pt reported, he wanted to fight clinicain. ) Thoughts of Harm to Others: (UTA.) Current Homicidal Intent: (UTA.) Current Homicidal Plan: (UTA.) Access to Homicidal Means: (UTA.) Identified Victim: UTA. History of harm to others?: Yes Assessment of Violence: On admission Violent Behavior Description: Pt reported, he wanted to fight  clinician.  Does patient have access to weapons?: (UTA.) Criminal Charges Pending?: (UTA.) Does patient have a court date: (UTA.) Is patient on probation?: (UTA.)  Psychosis Hallucinations: (UTA.) Delusions: Unspecified  Mental Status Report Appearance/Hygiene: In scrubs Eye Contact: Good Motor Activity: Restlessness, Agitation Speech: Aggressive, Rapid Level of Consciousness: Alert Mood: Irritable, Threatening Affect: Other (Comment)(congruent with mood. ) Anxiety Level: Minimal Thought Processes: Flight of Ideas, Thought Blocking Judgement: Impaired Orientation: Person, Place Obsessive Compulsive Thoughts/Behaviors: None  Cognitive Functioning Concentration: Poor Memory: Recent Impaired Is patient IDD: No Insight: Poor Impulse Control: Poor Appetite: (UTA.) Have you had any weight changes? : (UTA.) Sleep: Unable to Assess Vegetative Symptoms: Unable to Assess  ADLScreening [facility] Assessment Services) Patient's cognitive ability adequate to safely complete daily activities?: (UTA.) Patient able to express need for assistance with ADLs?: Yes Independently performs ADLs?: Yes (appropriate for developmental age)(Per chart.)  Prior Inpatient Therapy Prior Inpatient Therapy: Yes Prior Therapy Dates: 07/04/2017. Prior Therapy Facilty/Provider(s): [facility]. Reason for Treatment: Schizophrenia.   Prior Outpatient Therapy Prior Outpatient Therapy: (UTA.)  ADL Screening (condition at time of admission) Patient's cognitive ability adequate to safely complete daily activities?: (UTA.) Is the patient deaf or have difficulty hearing?: No Does the patient have difficulty seeing, even when wearing glasses/contacts?: (UTA) Does the patient have difficulty concentrating, remembering, or making decisions?: Yes Patient able to express need for assistance with ADLs?: Yes Does the patient have difficulty dressing or bathing?: (UTA) Independently performs ADLs?: Yes (appropriate for  developmental age)(Per chart.) Does the patient have difficulty walking or climbing stairs?: (UTA) Weakness of Legs: (UTA) Weakness of Arms/Hands: (UTA)       Abuse/Neglect  Assessment (Assessment to be complete while patient is alone) Abuse/Neglect Assessment Can Be Completed: Unable to assess, patient is non-responsive or altered mental status     Advance Directives (For Healthcare) Does Patient Have a Medical Advance Directive?: No(Per chart.) Would patient like information on creating a medical advance directive?: No - Patient declined(Per chart.)          Disposition: Nira Conn, FNP recommends overnight observation for safety, stabilization and re-evaluation. Disposition discussed with Hina, PA and Robyn, Charity fundraiser.  Disposition Initial Assessment Completed for this Encounter: Yes  On Site Evaluation by: Redmond Pulling, MS, [facility], [facility]. Reviewed with Physician: Hillary Bow, PA and Nira Conn, FNP.   Redmond Pulling 08/29/2018 11:43 PM    Redmond Pulling, MS, [facility], [facility] Triage Specialist [phone]

## 2018-08-29 NOTE — ED Notes (Signed)
Pt alert, staff unable to understand pt . Pt and staff can't identify what language pt speaks. Pt will speak in english at times. Pt appears to be aggressive. Pt currently in wrist restraints. Pt noted with a colostomy bag to the right lower quad. Pt resting in stretcher, will continue to monitor.

## 2018-08-30 LAB — URINALYSIS, ROUTINE W REFLEX MICROSCOPIC
Bilirubin Urine: NEGATIVE
GLUCOSE, UA: NEGATIVE mg/dL
Hgb urine dipstick: NEGATIVE
Ketones, ur: 20 mg/dL — AB
Leukocytes,Ua: NEGATIVE
Nitrite: NEGATIVE
PH: 5 (ref 5.0–8.0)
Protein, ur: NEGATIVE mg/dL
Specific Gravity, Urine: 1.015 (ref 1.005–1.030)

## 2018-08-30 LAB — RAPID URINE DRUG SCREEN, HOSP PERFORMED
AMPHETAMINES: NOT DETECTED
Barbiturates: NOT DETECTED
Benzodiazepines: NOT DETECTED
Cocaine: NOT DETECTED
Opiates: NOT DETECTED
Tetrahydrocannabinol: NOT DETECTED

## 2018-08-30 MED ORDER — LORAZEPAM 2 MG/ML IJ SOLN
1.0000 mg | Freq: Two times a day (BID) | INTRAMUSCULAR | Status: AC | PRN
  Administered 2018-09-01: 1 mg via INTRAMUSCULAR
  Filled 2018-08-30: qty 1

## 2018-08-30 MED ORDER — ZIPRASIDONE MESYLATE 20 MG IM SOLR
20.0000 mg | Freq: Once | INTRAMUSCULAR | Status: AC | PRN
  Administered 2018-08-30: 20 mg via INTRAMUSCULAR
  Filled 2018-08-30: qty 20

## 2018-08-30 MED ORDER — STERILE WATER FOR INJECTION IJ SOLN
INTRAMUSCULAR | Status: AC
  Administered 2018-08-30: 14:00:00
  Filled 2018-08-30: qty 10

## 2018-08-30 MED ORDER — ARIPIPRAZOLE 5 MG PO TABS
5.0000 mg | ORAL_TABLET | Freq: Every day | ORAL | Status: DC
  Administered 2018-08-31: 5 mg via ORAL
  Filled 2018-08-30: qty 1

## 2018-08-30 MED ORDER — LORAZEPAM 2 MG/ML IJ SOLN
2.0000 mg | Freq: Once | INTRAMUSCULAR | Status: AC
  Administered 2018-08-30: 2 mg via INTRAMUSCULAR
  Filled 2018-08-30: qty 1

## 2018-08-30 MED ORDER — HALOPERIDOL 5 MG PO TABS
5.0000 mg | ORAL_TABLET | Freq: Two times a day (BID) | ORAL | Status: DC
  Filled 2018-08-30: qty 1

## 2018-08-30 MED ORDER — LORAZEPAM 1 MG PO TABS
1.0000 mg | ORAL_TABLET | Freq: Two times a day (BID) | ORAL | Status: AC | PRN
  Administered 2018-09-01: 1 mg via ORAL
  Filled 2018-08-30: qty 1

## 2018-08-30 MED ORDER — GABAPENTIN 300 MG PO CAPS
300.0000 mg | ORAL_CAPSULE | Freq: Two times a day (BID) | ORAL | Status: DC
  Administered 2018-08-30 – 2018-08-31 (×2): 300 mg via ORAL
  Filled 2018-08-30 (×3): qty 1

## 2018-08-30 MED ORDER — LORAZEPAM 1 MG PO TABS
1.0000 mg | ORAL_TABLET | Freq: Two times a day (BID) | ORAL | Status: DC
  Filled 2018-08-30: qty 1

## 2018-08-30 NOTE — ED Provider Notes (Signed)
Asked to see patient.  Patient sitting up, agitated despite 2 mg of Ativan earlier.  Restraint order repeated.   Peter Golden, Peter Ruiz, MD 08/30/18 [phone]

## 2018-08-30 NOTE — ED Notes (Signed)
Pt stated he had to [employer] told him he had a condom cath on to go ahead an pee. He insisted on pulling off condom cath and peed in the floor. Pt kept asking for his personal clothes to put on. Writer told him that he could have his personal belongings once he discharges. He still refuses to wear purple scrubs provided.

## 2018-08-30 NOTE — Progress Notes (Addendum)
This patient continues to meet inpatient criteria. CSW fax information to the following facilities:   [facility]- declined [facility]  Patient to be IVC'd by Dr.Akintayo.  Enid Cutter, LCSW-A Clinical Social Worker [phone]

## 2018-08-30 NOTE — ED Notes (Signed)
Pt has not urinated while in the tcu. Pt given 3 cups of water and 2 cups of apple juice at 0530. Pt was given water around 0130 but pt kept spitting it out. Dr.Molpus made aware. Pt currently has a condom cath on to catch a urine specimen. Will continue to monitor.
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
## 2018-08-30 NOTE — Progress Notes (Signed)
Patient ID: Peter Golden, male   DOB: [birth year], 38 y.o.   MRN: [id number]  Asked to see pt, he is sitting up in bed, agitated, talking non-stop, speaking in [place]. Does not appear to be in distress.  Restraint order renewed for safety. PRN medications in place on MAR if needed.   Laveda Abbe, FNP-C 08/30/2018           1726

## 2018-08-30 NOTE — ED Notes (Signed)
Hina, PA explains to nurse that its okay to allow the patient to urinate on his own and a in and out cath is not necessary at this time.

## 2018-08-30 NOTE — ED Notes (Signed)
Pt yelling and screaming and attempting to get out of bed. Pt refuses to speak in english or to communicate with staff. Pt given 2mg  ativan IM. Pt currently in in bilateral wrist restraints. Will continue to monitor.

## 2018-08-30 NOTE — Progress Notes (Signed)
Patient ID: Peter Golden, male   DOB: [birth year], 38 y.o.   MRN: [id number]  Pt is currently resting with one wrist and one ankle restraint off.  Pt is in NAD. Restraint order renewed for safety of Pt.   Laveda Abbe, FNP-C 08/30/2018       1734

## 2018-08-31 DIAGNOSIS — F251 Schizoaffective disorder, depressive type: Secondary | ICD-10-CM

## 2018-08-31 MED ORDER — LEVETIRACETAM 500 MG PO TABS
500.0000 mg | ORAL_TABLET | Freq: Two times a day (BID) | ORAL | Status: DC
  Administered 2018-08-31 – 2018-09-02 (×5): 500 mg via ORAL
  Filled 2018-08-31 (×5): qty 1

## 2018-08-31 MED ORDER — ACETAMINOPHEN 500 MG PO TABS
500.0000 mg | ORAL_TABLET | ORAL | Status: DC | PRN
  Filled 2018-08-31: qty 1

## 2018-08-31 MED ORDER — HALOPERIDOL 5 MG PO TABS
5.0000 mg | ORAL_TABLET | Freq: Two times a day (BID) | ORAL | Status: DC
  Administered 2018-08-31 – 2018-09-02 (×5): 5 mg via ORAL
  Filled 2018-08-31 (×5): qty 1

## 2018-08-31 MED ORDER — BENZTROPINE MESYLATE 1 MG PO TABS
1.0000 mg | ORAL_TABLET | Freq: Every day | ORAL | Status: DC
  Administered 2018-08-31 – 2018-09-02 (×3): 1 mg via ORAL
  Filled 2018-08-31 (×3): qty 1

## 2018-08-31 MED ORDER — POTASSIUM CHLORIDE CRYS ER 10 MEQ PO TBCR
10.0000 meq | EXTENDED_RELEASE_TABLET | Freq: Every day | ORAL | Status: AC
  Administered 2018-08-31 – 2018-09-01 (×2): 10 meq via ORAL
  Filled 2018-08-31 (×2): qty 1

## 2018-08-31 NOTE — Progress Notes (Signed)
08/31/2018  1818  Patient's roommate Minerva Areola [phone] if you have any questions he is willing to help.
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
## 2018-08-31 NOTE — BH Assessment (Signed)
[facility] Assessment Progress Note  Per Juanetta Beets, DO, this pt requires psychiatric hospitalization at this time.  Pt presents under IVC initiated by Thedore Mins, MD.  The following facilities have been contacted to seek placement for this pt, with results as noted:  Beds available, information sent, decision pending:  [facility]   Declined:  [facility] (no appropriate bed at this time) [facility] (due to pt acuity)   Doylene Canning, MA Behavioral Health Coordinator [phone]

## 2018-08-31 NOTE — ED Notes (Signed)
Ostomy supplies in locker, [place] # 2 and [facility] [phone] for two piece 2 3/4" ostomy barrier and pouch.

## 2018-08-31 NOTE — Progress Notes (Signed)
08/31/2018  1432  For patients colostomy Call off site materials and let them know the courier needs to bring colostomy supplies within 2 hrs. [facility]# 2 and [facility] # [phone].

## 2018-08-31 NOTE — Consult Note (Signed)
WOC Nurse ostomy consult note Stoma type/location: RUQ colostomy Stomal assessment/size: Approximately 1 and 3/4 inches, not seen without pouch today Peristomal assessment: Not seen today Treatment options for stomal/peristomal skin: None indicated Output: brown stool Ostomy pouching: 2pc. 2 and 3/4 inch pouching system.  Bedside RN is supplied with [facility] numbers to order supplies via the Orders. Education provided: None.  Patient is able to assist RN Sibyl Parr) with application of pouching system and is able to empty by himself. Requires additional pouches for downstream transfer as he had none to bring with him to the ED. Enrolled patient in [employer] DC program: Not today.  HAd been done previously.  WOC nursing team will not follow, but will remain available to this patient, the nursing and medical teams.  Please re-consult if needed. Thanks, Ladona Mow, MSN, RN, GNP, [place]  Pager# [phone]

## 2018-08-31 NOTE — Progress Notes (Signed)
Pt has stool that has seeped out of his ostomy appliance. I asked pt to take a shower and allow me to change the ostomy pouch. He agreed, shower was prepared and then he refused. Pt will not allow me to remove pouch, pt will not remove it himself. It smells of stool because it is leaking. Pt can not be reasoned with at this time. 3 sets of pouches obtained to keep for use while pt in house or when he transfers to facility. Will try to reason with pt when he has settled down. Erick Colace, RN

## 2018-08-31 NOTE — ED Notes (Signed)
Loyal Gambler [phone]

## 2018-08-31 NOTE — Progress Notes (Signed)
08/31/2018  1055  Paged wound ostomy RN for consult. Lawson Fiscal [phone].
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
## 2018-09-02 ENCOUNTER — Encounter (HOSPITAL_COMMUNITY): Payer: Self-pay | Admitting: *Deleted

## 2018-09-02 ENCOUNTER — Inpatient Hospital Stay (HOSPITAL_COMMUNITY)
Admission: AD | Admit: 2018-09-02 | Discharge: 2018-09-07 | DRG: 885 | Disposition: A | Discharge status: Home / Self Care | Payer: Federal, State, Local not specified - Other | Source: Intra-hospital | Attending: Psychiatry | Admitting: Psychiatry

## 2018-09-02 DIAGNOSIS — Z933 Colostomy status: Secondary | ICD-10-CM | POA: Diagnosis not present

## 2018-09-02 DIAGNOSIS — Z79899 Other long term (current) drug therapy: Secondary | ICD-10-CM

## 2018-09-02 DIAGNOSIS — F329 Major depressive disorder, single episode, unspecified: Secondary | ICD-10-CM | POA: Diagnosis present

## 2018-09-02 DIAGNOSIS — Z9081 Acquired absence of spleen: Secondary | ICD-10-CM

## 2018-09-02 DIAGNOSIS — G4700 Insomnia, unspecified: Secondary | ICD-10-CM | POA: Diagnosis present

## 2018-09-02 DIAGNOSIS — Z903 Acquired absence of stomach [part of]: Secondary | ICD-10-CM | POA: Diagnosis not present

## 2018-09-02 DIAGNOSIS — R569 Unspecified convulsions: Secondary | ICD-10-CM | POA: Diagnosis present

## 2018-09-02 DIAGNOSIS — F200 Paranoid schizophrenia: Secondary | ICD-10-CM | POA: Diagnosis present

## 2018-09-02 MED ORDER — LORAZEPAM 1 MG PO TABS: 1.0000 mg | ORAL_TABLET | Freq: Once | ORAL | Status: DC | PRN

## 2018-09-02 MED ORDER — ACETAMINOPHEN 500 MG PO TABS: 500.0000 mg | ORAL_TABLET | ORAL | Status: DC | PRN

## 2018-09-02 MED ORDER — LORAZEPAM 2 MG/ML IJ SOLN: 1.0000 mg | Freq: Once | INTRAMUSCULAR | Status: DC | PRN

## 2018-09-02 MED ORDER — BENZTROPINE MESYLATE 1 MG PO TABS
1.0000 mg | ORAL_TABLET | Freq: Every day | ORAL | Status: DC
  Administered 2018-09-03 – 2018-09-05 (×3): 1 mg via ORAL
  Filled 2018-09-02 (×6): qty 1

## 2018-09-02 MED ORDER — ALUM & MAG HYDROXIDE-SIMETH 200-200-20 MG/5ML PO SUSP: 30.0000 mL | ORAL | Status: DC | PRN

## 2018-09-02 MED ORDER — LEVETIRACETAM 500 MG PO TABS
500.0000 mg | ORAL_TABLET | Freq: Two times a day (BID) | ORAL | Status: DC
  Administered 2018-09-02 – 2018-09-07 (×10): 500 mg via ORAL
  Filled 2018-09-02 (×3): qty 1
  Filled 2018-09-02: qty 14
  Filled 2018-09-02 (×10): qty 1
  Filled 2018-09-02: qty 14
  Filled 2018-09-02: qty 1

## 2018-09-02 MED ORDER — HYDROXYZINE HCL 25 MG PO TABS
25.0000 mg | ORAL_TABLET | Freq: Three times a day (TID) | ORAL | Status: DC | PRN
  Administered 2018-09-04: 25 mg via ORAL
  Filled 2018-09-02 (×2): qty 1

## 2018-09-02 MED ORDER — MAGNESIUM HYDROXIDE 400 MG/5ML PO SUSP: 30.0000 mL | Freq: Every day | ORAL | Status: DC | PRN

## 2018-09-02 MED ORDER — ACETAMINOPHEN 325 MG PO TABS
650.0000 mg | ORAL_TABLET | Freq: Four times a day (QID) | ORAL | Status: DC | PRN
  Administered 2018-09-05: 650 mg via ORAL
  Filled 2018-09-02: qty 2

## 2018-09-02 MED ORDER — TRAZODONE HCL 50 MG PO TABS
50.0000 mg | ORAL_TABLET | Freq: Every evening | ORAL | Status: DC | PRN
  Administered 2018-09-04 – 2018-09-06 (×2): 50 mg via ORAL
  Filled 2018-09-02 (×3): qty 1

## 2018-09-02 MED ORDER — HALOPERIDOL 5 MG PO TABS
5.0000 mg | ORAL_TABLET | Freq: Two times a day (BID) | ORAL | Status: DC
  Administered 2018-09-02: 5 mg via ORAL
  Filled 2018-09-02 (×6): qty 1

## 2018-09-02 MED ORDER — ZIPRASIDONE MESYLATE 20 MG IM SOLR
10.0000 mg | Freq: Once | INTRAMUSCULAR | Status: DC
  Filled 2018-09-02: qty 20

## 2018-09-02 NOTE — ED Notes (Addendum)
PT CONTINUES TO BE PLEASANT, COOPERATE, ACTIVE AND INDEPENDENT WITH CARE. GIVEN ALL MEDICATIONS WITHOUT EVENT.  PT REQUEST TO APPLY COLOSTOMY ENSEMBLE HIMSELF. SUPPLIES AT NURSING STATION. CHANGED THIS AM AFTER SHOWER.  DENIES SI/HI

## 2018-09-02 NOTE — BH Assessment (Signed)
[facility] Assessment Progress Note  Per Juanetta Beets, DO, this pt continues to require psychiatric hospitalization.  Malva Limes, RN, [facility] has assigned pt to [facility] Rm 501-1.  Pt presents under IVC initiated by Thedore Mins, MD, and IVC documents have been faxed to [facility].  Pt's nurse, Morrie Sheldon, has been notified, and agrees to call report to [phone].  Pt is to be transported via [employer].   Doylene Canning, [place] Behavioral Health Coordinator [phone]

## 2018-09-02 NOTE — Progress Notes (Signed)
Peter Golden is a 38 year old male pt admitted on involuntary basis. On admission, pt was generally uncooperative and unable to sign admission paperwork or participate in assessment. When questions were asked he would keep repeating "what". He was unable to explain why he was here or what he needed. He did have ostomy in place that was partially filled with stool on admission. He was escorted back to his room with law enforcement and safety was maintained.

## 2018-09-02 NOTE — ED Notes (Signed)
Pt would not change into scrubs.  We spent 45 minutes trying to convince him.  He was in hospital bed in his underwear.  Pt finally agreed to put on his personal pants after they were searched.  Pt was calm this whole time but did not want to wear scrubs  Pt finally discharged with [employer].  Pt was in no distress.  All belongings were sent with pt.

## 2018-09-02 NOTE — Progress Notes (Signed)
Did not attend group 

## 2018-09-02 NOTE — Consult Note (Addendum)
[facility] Psych ED Progress Note  09/02/2018 11:17 AM Rulon Abdalla  MRN:  [id number] Subjective:   Mr. Peter Golden reports that his mood is "good" today. He stated he slept okay. He stated "I am an open book" when asked how he was doing today. He is speaking [place] and has remained calm and cooperative for the 24-48 hours.  He denies SI, HI or AVH. He did eat breakfast this morning. He is smiling and pleasant this morning. He is taking his PO medications.  He will continue to be monitored. Pt will require inpatient psychiatric admission for further stabilization and medication management.    Principal Problem: Schizoaffective disorder, depressive type (HCC) Diagnosis:  Principal Problem:   Schizoaffective disorder, depressive type (HCC)  Total Time spent with patient: 15 minutes  Past Psychiatric History: Schizoaffective disorder.  Past Medical History:  Past Medical History:  Diagnosis Date  . Schizo affective schizophrenia (HCC)   . Seizures (HCC)     Past Surgical History:  Procedure Laterality Date  . IR THORACENTESIS ASP PLEURAL SPACE W/IMG GUIDE  04/22/2018  . LAPAROTOMY N/A 04/15/2018   Procedure: EXPLORATORY LAPAROTOMY with splenectomy, transverse colectomy, partial gastrectomy, repair of diaphragmatic hernia, left 14 Fr pigtail tube thoracostomy, repair of umbilical hernia;  Surgeon: Almond Lint, MD;  Location: [facility] OR;  Service: General;  Laterality: N/A;   Family History:  Family History  Problem Relation Age of Onset  . Mental illness Neg Hx    Family Psychiatric  History: None  Social History:  Social History   Substance and Sexual Activity  Alcohol Use Not Currently   Comment: BAC was not available at time of assessment     Social History   Substance and Sexual Activity  Drug Use Not Currently   Comment: UDS not available at time of assessment    Social History   Socioeconomic History  . Marital status: Single    Spouse name: Not on file  . Number of  children: Not on file  . Years of education: Not on file  . Highest education level: Not on file  Occupational History  . Not on file  Social Needs  . Financial resource strain: Not on file  . Food insecurity:    Worry: Patient refused    Inability: Patient refused  . Transportation needs:    Medical: No    Non-medical: No  Tobacco Use  . Smoking status: Never Smoker  . Smokeless tobacco: Never Used  Substance and Sexual Activity  . Alcohol use: Not Currently    Comment: BAC was not available at time of assessment  . Drug use: Not Currently    Comment: UDS not available at time of assessment  . Sexual activity: Yes    Birth control/protection: None  Lifestyle  . Physical activity:    Days per week: 3 days    Minutes per session: 30 min  . Stress: Not at all  Relationships  . Social connections:    Talks on phone: Patient refused    Gets together: Patient refused    Attends religious service: Patient refused    Active member of club or organization: Patient refused    Attends meetings of clubs or organizations: Patient refused    Relationship status: Patient refused  Other Topics Concern  . Not on file  Social History Narrative   ** Merged History Encounter **        Sleep: Good  Appetite:  Good  Current Medications: Current Facility-Administered Medications  Medication Dose Route Frequency Provider Last Rate Last Dose  . acetaminophen (TYLENOL) tablet 500 mg  500 mg Oral Q4H PRN Charlynne Pander, MD      . benztropine (COGENTIN) tablet 1 mg  1 mg Oral Daily Charm Rings, NP   1 mg at 09/02/18 1101  . haloperidol (HALDOL) tablet 5 mg  5 mg Oral BID Charm Rings, NP   5 mg at 09/02/18 1100  . levETIRAcetam (KEPPRA) tablet 500 mg  500 mg Oral BID Charm Rings, NP   500 mg at 09/02/18 1100   Current Outpatient Medications  Medication Sig Dispense Refill  . benztropine (COGENTIN) 0.5 MG tablet Take 1 tablet (0.5 mg total) by mouth at bedtime. For  prevention of drug induced tremors (Patient not taking: Reported on 05/08/2018) 30 tablet 0  . benztropine (COGENTIN) 0.5 MG tablet Take 1 tablet (0.5 mg total) by mouth 2 (two) times daily for 30 days. 60 tablet 0  . docusate sodium (COLACE) 100 MG capsule Take 1 capsule (100 mg total) by mouth 2 (two) times daily as needed for mild constipation. (Patient not taking: Reported on 05/08/2018) 10 capsule 0  . haloperidol (HALDOL) 5 MG tablet Take 1 tablet (5 mg total) by mouth 2 (two) times daily for 30 days. 60 tablet 0  . haloperidol decanoate (HALDOL DECANOATE) 100 MG/ML injection Inject 0.5 mLs (50 mg total) into the muscle every 30 (thirty) days. (Due on 08-08-17): For mood control (Patient not taking: Reported on 05/08/2018) 1 mL 0  . hydrOXYzine (ATARAX/VISTARIL) 25 MG tablet Take 1 tablet (25 mg total) by mouth every 6 (six) hours as needed (sleep). Anxiety (Patient not taking: Reported on 05/08/2018) 6 tablet 0  . levETIRAcetam (KEPPRA) 500 MG tablet Take 1 tablet (500 mg total) by mouth 2 (two) times daily for 30 days. 60 tablet 0  . mirtazapine (REMERON) 15 MG tablet Take 1 tablet (15 mg total) by mouth at bedtime. For depression/sleep (Patient not taking: Reported on 05/08/2018) 30 tablet 0    Lab Results: No results found for this or any previous visit (from the past 48 hour(s)).  Blood Alcohol level:  Lab Results  Component Value Date   ETH <10 08/29/2018   ETH <10 07/18/2018    Musculoskeletal: Strength & Muscle Tone: within normal limits Gait & Station: UTA since patient is lying in bed. Patient leans: N/A  Psychiatric Specialty Exam: Physical Exam  Nursing note and vitals reviewed. Constitutional: He is oriented to person, place, and time. He appears well-developed and well-nourished.  HENT:  Head: Normocephalic and atraumatic.  Neck: Normal range of motion.  Respiratory: Effort normal.  Musculoskeletal: Normal range of motion.  Neurological: He is alert and oriented to  person, place, and time.  Psychiatric: His speech is normal and behavior is normal. Thought content normal. His affect is blunt. Cognition and memory are impaired. He expresses impulsivity.    Review of Systems  Psychiatric/Behavioral: Negative for hallucinations and suicidal ideas.  All other systems reviewed and are negative.   Blood pressure 112/75, pulse (!) 108, temperature 98.5 F (36.9 C), temperature source Oral, resp. rate 16, SpO2 97 %.There is no height or weight on file to calculate BMI.  General Appearance: Fairly Groomed, young, African male, wearing paper hospital scrubs who is lying in bed. NAD.   Eye Contact:  Good  Speech:  Clear and Coherent and Normal Rate  Volume:  Normal  Mood:  Euthymic  Affect:  Blunt  Thought  Process:  Linear and Descriptions of Associations: Intact  Orientation:  Full (Time, Place, and Person)  Thought Content:  Logical  Suicidal Thoughts:  No  Homicidal Thoughts:  No  Memory:  Immediate;   Fair Recent;   Fair Remote;   Fair  Judgement:  Poor  Insight:  Lacking  Psychomotor Activity:  Normal  Concentration:  Concentration: Fair and Attention Span: Fair  Recall:  [employer] of Knowledge:  Fair  Language:  Good  Akathisia:  No  Handed:  Right  AIMS (if indicated):   N/A  Assets:  Communication Skills Desire for Improvement Housing  ADL's:  Intact  Cognition:  Impaired due to psychiatric condition.   Sleep:   Okay   Assessment:  Cutter Crusan is a 38 y.o. male who was admitted with agitation and psychosis in the setting of poor medication compliance. He had one episode of agitation a day ago but his symptoms appear to be gradually improving. He continues to warrant inpatient psychiatric hospitalization for stabilization and treatment.   Treatment Plan Summary: -Continue Cogentin 1 mg daily for EPS prophylaxis.  -Continue Haldol 5 mg BID for psychosis.    Laveda Abbe, NP 09/02/2018, 11:17 AM

## 2018-09-02 NOTE — ED Notes (Addendum)
Pt placed on hospital bed; side rails padded with blankets d/t no sz pads available to fit hospital bed.  Questioned pt how he felt this morning.  Pt stated "so, so".

## 2018-09-02 NOTE — BH Assessment (Signed)
[facility]HH Assessment Progress Note  At 08:40 this writer called [facility] and spoke to [place].  He reports that pt is on their wait list.  He is not, however, a priority referral.  Doylene Canning, MA Behavioral Health Coordinator [phone]

## 2018-09-02 NOTE — Tx Team (Signed)
Initial Treatment Plan 09/02/2018 5:23 PM [name] [name] FRT:[id number]    PATIENT STRESSORS: Other: unknown   PATIENT STRENGTHS: Ability for insight Average or above average intelligence Capable of independent living   PATIENT IDENTIFIED PROBLEMS: Psychosis "What, I don't know"                     DISCHARGE CRITERIA:  Ability to meet basic life and health needs Improved stabilization in mood, thinking, and/or behavior Verbal commitment to aftercare and medication compliance  PRELIMINARY DISCHARGE PLAN: Attend aftercare/continuing care group  PATIENT/FAMILY INVOLVEMENT: This treatment plan has been presented to and reviewed with the patient, Peter Golden, and/or family member, .  The patient and family have been given the opportunity to ask questions and make suggestions.  Jeannene Tschetter, [place], [place] 09/02/2018, 5:23 PM

## 2018-09-03 ENCOUNTER — Other Ambulatory Visit: Payer: Self-pay

## 2018-09-03 DIAGNOSIS — F200 Paranoid schizophrenia: Principal | ICD-10-CM

## 2018-09-03 MED ORDER — HALOPERIDOL 5 MG PO TABS
5.0000 mg | ORAL_TABLET | Freq: Three times a day (TID) | ORAL | Status: DC
  Administered 2018-09-03 – 2018-09-05 (×7): 5 mg via ORAL
  Filled 2018-09-03 (×12): qty 1

## 2018-09-03 MED ORDER — GABAPENTIN 300 MG PO CAPS
300.0000 mg | ORAL_CAPSULE | Freq: Three times a day (TID) | ORAL | Status: DC
  Administered 2018-09-03 – 2018-09-07 (×14): 300 mg via ORAL
  Filled 2018-09-03 (×2): qty 1
  Filled 2018-09-03: qty 21
  Filled 2018-09-03: qty 1
  Filled 2018-09-03: qty 21
  Filled 2018-09-03 (×12): qty 1
  Filled 2018-09-03: qty 21
  Filled 2018-09-03 (×2): qty 1
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
## 2018-09-03 NOTE — BHH Group Notes (Signed)
BHH LCSW Group Therapy Note  Date/Time: 09/03/18, 1315  Type of Therapy/Topic:  Group Therapy:  Balance in Life  Participation Level:  minimal  Description of Group:    This group will address the concept of balance and how it feels and looks when one is unbalanced. Patients will be encouraged to process areas in their lives that are out of balance, and identify reasons for remaining unbalanced. Facilitators will guide patients utilizing problem- solving interventions to address and correct the stressor making their life unbalanced. Understanding and applying boundaries will be explored and addressed for obtaining  and maintaining a balanced life. Patients will be encouraged to explore ways to assertively make their unbalanced needs known to significant others in their lives, using other group members and facilitator for support and feedback.  Therapeutic Goals: 1. Patient will identify two or more emotions or situations they have that consume much of in their lives. 2. Patient will identify signs/triggers that life has become out of balance:  3. Patient will identify two ways to set boundaries in order to achieve balance in their lives:  4. Patient will demonstrate ability to communicate their needs through discussion and/or role plays  Summary of Patient Progress:Pt attended group and was attentive. He did not make comments during group discussion but did respond to CSW questions.  He attempted to identify areas out of balance but did not appear to completely understand the questions.  His answers were coherent and relevant.            Therapeutic Modalities:   Cognitive Behavioral Therapy Solution-Focused Therapy Assertiveness Training  Daleen Squibb, [place]

## 2018-09-03 NOTE — Plan of Care (Signed)
Progress note  D: pt found in bed; compliant with medication administration. Pt states he slept well. Pt rates his depression/hopelessness/anxiety a 3/3/4 out of 10 respectively. Pt denies any physical symptoms or pain, rating this a 0/10. Pt states his goal for today is to talk to the dr and he will achieve this by waiting. Pt denies si/hi/ah/vh and verbally agrees to approach staff fi these become apparent or before harming himself/others while at [facility]. Pt is visibly guarded and anxious. Pt may be hearing voices but not responding.  A: pt provided support and encouragement. Pt given medication per protocol and standing orders. Q38m safety checks implemented and continued. R: pt safe on the unit. Will continue to monitor.   Pt progressing in the following metrics  Problem: Education: Goal: Knowledge of [facility] Education information/materials will improve Outcome: Progressing Goal: Emotional status will improve Outcome: Progressing Goal: Mental status will improve Outcome: Progressing Goal: Verbalization of understanding the information provided will improve Outcome: Progressing

## 2018-09-03 NOTE — Progress Notes (Signed)
D:  Peter Golden was in his room all evening.  He did not attend evening wrap up group or snack.  He was given snack and he ate 2 ice creams and juice.  His friend that visited was concerned that he didn't eat his supper and offered to bring him food that he would eat.  Visitor was informed that would have to be approved my MD tomorrow.  Pt stated he has no problems eating but we will monitor intake.  He was pleasant but guarded this evening.  He did request the supplies to change out his colostomy but did not come out of his room for medications.  He took his scheduled medications without difficulty when RN brought them to his room.  He denied any pain or discomfort and appeared to be in no physical distress.  Reviewed some admission questions to complete more of the admission.  He denied SI/HI but did admit to hearing voices that he was unable to explain what they are saying.  He is currently resting with his eyes closed and appears to be asleep. A:  1:1 with RN for support and encouragement.  Medications as ordered.  Q 15 minute checks maintained for safety.  Encouraged participation in group and unit activities.   R:  Peter Golden remains safe on the unit.  We will continue to monitor the progress towards his goals.

## 2018-09-03 NOTE — Progress Notes (Signed)
D: Pt denies SI/HI/AVH. Pt is pleasant and cooperative. Pt has kept to himself majority of the evening , pt forwards little information at this time.  A: Pt was offered support and encouragement. Pt was given scheduled medications. Pt was encourage to attend groups. Q 15 minute checks were done for safety.   R:Pt attends groups and interacts well with peers and staff. Pt is taking medication. Pt has no complaints.Pt receptive to treatment and safety maintained on unit.  Problem: Coping: Goal: Ability to demonstrate self-control will improve Outcome: Progressing   Problem: Activity: Goal: Sleeping patterns will improve Outcome: Progressing

## 2018-09-03 NOTE — BHH Suicide Risk Assessment (Signed)
[facility] INPATIENT:  Family/Significant Other Suicide Prevention Education  Suicide Prevention Education:  Contact Attempts: Benjamine Mola, friend, [phone] been identified by the patient as the family member/significant other with whom the patient will be residing, and identified as the person(s) who will aid the patient in the event of a mental health crisis.  With written consent from the patient, two attempts were made to provide suicide prevention education, prior to and/or following the patient's discharge.  We were unsuccessful in providing suicide prevention education.  A suicide education pamphlet was given to the patient to share with family/significant other.  Date and time of first attempt:09/03/18, 1535 Date and time of second attempt:  Lorri Frederick, LCSW 09/03/2018, 3:35 PM
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
## 2018-09-03 NOTE — [facility] Suicide Risk Assessment (Signed)
[facility] Admission Suicide Risk Assessment   Nursing information obtained from:  Patient Demographic factors:  Male Current Mental Status:  NA Loss Factors:  NA Historical Factors:  NA Risk Reduction Factors:  Positive coping skills or problem solving skills  Total Time spent with patient: 45 minutes Principal Problem: Exacerbation and underlying psychotic disorder Diagnosis:  Active Problems:   Schizophrenia, paranoid, chronic (HCC)  Subjective Data: Patient present the emergency department with disorganized behavior on 2/29 requiring inpatient stabilization due to untreated psychosis Continued Clinical Symptoms:    The "Alcohol Use Disorders Identification Test", Guidelines for Use in Primary Care, Second Edition.  World Science writer [facility]). Score between 0-7:  no or low risk or alcohol related problems. Score between 8-15:  moderate risk of alcohol related problems. Score between 16-19:  high risk of alcohol related problems. Score 20 or above:  warrants further diagnostic evaluation for alcohol dependence and treatment.   CLINICAL FACTORS:   Schizophrenia:   Paranoid or undifferentiated type     COGNITIVE FEATURES THAT CONTRIBUTE TO RISK:  Loss of executive function    SUICIDE RISK:   Minimal: No identifiable suicidal ideation.  Patients presenting with no risk factors but with morbid ruminations; may be classified as minimal risk based on the severity of the depressive symptoms  PLAN OF CARE: Admitted for evaluation and stabilization see admitting orders  I certify that inpatient services furnished can reasonably be expected to improve the patient's condition.   Malvin Johns, MD 09/03/2018, 8:07 AM

## 2018-09-04 MED ORDER — HALOPERIDOL DECANOATE 100 MG/ML IM SOLN
100.0000 mg | INTRAMUSCULAR | Status: DC
  Administered 2018-09-04: 100 mg via INTRAMUSCULAR
  Filled 2018-09-04: qty 1

## 2018-09-04 NOTE — [facility] Group Notes (Signed)
[facility] Group Therapy 09/04/2018 1:15pm  Type of Therapy: Process group.  Participation Level: Did not attend  Participation Quality:   Modes of Intervention: Discussion and Socialization  Summary of Progress/Problems: CSW conducted process group after last minute cancellation.  Several pt's were quite upset and emotional regarding specific circumstances they were dealing with.  Those pts shared with the group and received feedback from other group members.  CSW facilitated this, provided feedback as well.   Lorri Frederick, LCSW 09/04/2018 2:27 PM

## 2018-09-04 NOTE — Tx Team (Addendum)
Interdisciplinary Treatment and Diagnostic Plan Update  09/04/2018 Time of Session: 09/04/2018 9:10 AM Pasha Saline MRN: [id number]  Principal Diagnosis: <principal problem not specified>  Secondary Diagnoses: Active Problems:   Schizophrenia, paranoid, chronic (HCC)   Current Medications:  Current Facility-Administered Medications  Medication Dose Route Frequency Provider Last Rate Last Dose  . acetaminophen (TYLENOL) tablet 650 mg  650 mg Oral Q6H PRN Laveda Abbe, NP      . alum & mag hydroxide-simeth (MAALOX/MYLANTA) 200-200-20 MG/5ML suspension 30 mL  30 mL Oral Q4H PRN Laveda Abbe, NP      . benztropine (COGENTIN) tablet 1 mg  1 mg Oral Daily Laveda Abbe, NP   1 mg at 09/04/18 0825  . gabapentin (NEURONTIN) capsule 300 mg  300 mg Oral TID Malvin Johns, MD   300 mg at 09/04/18 1221  . haloperidol (HALDOL) tablet 5 mg  5 mg Oral TID Malvin Johns, MD   5 mg at 09/04/18 1221  . haloperidol decanoate (HALDOL DECANOATE) 100 MG/ML injection 100 mg  100 mg Intramuscular Q30 days Malvin Johns, MD   100 mg at 09/04/18 1222  . hydrOXYzine (ATARAX/VISTARIL) tablet 25 mg  25 mg Oral TID PRN Laveda Abbe, NP      . levETIRAcetam (KEPPRA) tablet 500 mg  500 mg Oral BID Laveda Abbe, NP   500 mg at 09/04/18 0825  . LORazepam (ATIVAN) injection 1 mg  1 mg Intramuscular Once PRN Laveda Abbe, NP      . LORazepam (ATIVAN) tablet 1 mg  1 mg Oral Once PRN Laveda Abbe, NP      . magnesium hydroxide (MILK OF MAGNESIA) suspension 30 mL  30 mL Oral Daily PRN Laveda Abbe, NP      . traZODone (DESYREL) tablet 50 mg  50 mg Oral QHS PRN Laveda Abbe, NP      . ziprasidone (GEODON) injection 10 mg  10 mg Intramuscular Once Laveda Abbe, NP       PTA Medications: Medications Prior to Admission  Medication Sig Dispense Refill Last Dose  . benztropine (COGENTIN) 0.5 MG tablet Take 1 tablet (0.5 mg total) by mouth  at bedtime. For prevention of drug induced tremors (Patient not taking: Reported on 05/08/2018) 30 tablet 0 Not Taking at Unknown time  . benztropine (COGENTIN) 0.5 MG tablet Take 1 tablet (0.5 mg total) by mouth 2 (two) times daily for 30 days. 60 tablet 0   . docusate sodium (COLACE) 100 MG capsule Take 1 capsule (100 mg total) by mouth 2 (two) times daily as needed for mild constipation. (Patient not taking: Reported on 05/08/2018) 10 capsule 0 Not Taking at Unknown time  . haloperidol (HALDOL) 5 MG tablet Take 1 tablet (5 mg total) by mouth 2 (two) times daily for 30 days. 60 tablet 0   . haloperidol decanoate (HALDOL DECANOATE) 100 MG/ML injection Inject 0.5 mLs (50 mg total) into the muscle every 30 (thirty) days. (Due on 08-08-17): For mood control (Patient not taking: Reported on 05/08/2018) 1 mL 0 Not Taking at Unknown time  . hydrOXYzine (ATARAX/VISTARIL) 25 MG tablet Take 1 tablet (25 mg total) by mouth every 6 (six) hours as needed (sleep). Anxiety (Patient not taking: Reported on 05/08/2018) 6 tablet 0 Not Taking at Unknown time  . levETIRAcetam (KEPPRA) 500 MG tablet Take 1 tablet (500 mg total) by mouth 2 (two) times daily for 30 days. 60 tablet 0 07/16/2018  . mirtazapine (REMERON)  15 MG tablet Take 1 tablet (15 mg total) by mouth at bedtime. For depression/sleep (Patient not taking: Reported on 05/08/2018) 30 tablet 0 Not Taking at Unknown time    Patient Stressors: Other: unknown  Patient Strengths: Ability for insight Average or above average intelligence Capable of independent living  Treatment Modalities: Medication Management, Group therapy, Case management,  1 to 1 session with clinician, Psychoeducation, Recreational therapy.   Physician Treatment Plan for Primary Diagnosis: <principal problem not specified> Long Term Goal(s): Improvement in symptoms so as ready for discharge Improvement in symptoms so as ready for discharge   Short Term Goals: Ability to disclose and  discuss suicidal ideas Compliance with prescribed medications will improve Ability to identify triggers associated with substance abuse/mental health issues will improve  Medication Management: Evaluate patient's response, side effects, and tolerance of medication regimen.  Therapeutic Interventions: 1 to 1 sessions, Unit Group sessions and Medication administration.  Evaluation of Outcomes: Progressing  Physician Treatment Plan for Secondary Diagnosis: Active Problems:   Schizophrenia, paranoid, chronic (HCC)  Long Term Goal(s): Improvement in symptoms so as ready for discharge Improvement in symptoms so as ready for discharge   Short Term Goals: Ability to disclose and discuss suicidal ideas Compliance with prescribed medications will improve Ability to identify triggers associated with substance abuse/mental health issues will improve     Medication Management: Evaluate patient's response, side effects, and tolerance of medication regimen.  Therapeutic Interventions: 1 to 1 sessions, Unit Group sessions and Medication administration.  Evaluation of Outcomes: Progressing   RN Treatment Plan for Primary Diagnosis: <principal problem not specified> Long Term Goal(s): Knowledge of disease and therapeutic regimen to maintain health will improve  Short Term Goals: Ability to remain free from injury will improve and Compliance with prescribed medications will improve  Medication Management: RN will administer medications as ordered by provider, will assess and evaluate patient's response and provide education to patient for prescribed medication. RN will report any adverse and/or side effects to prescribing provider.  Therapeutic Interventions: 1 on 1 counseling sessions, Psychoeducation, Medication administration, Evaluate responses to treatment, Monitor vital signs and CBGs as ordered, Perform/monitor CIWA, COWS, AIMS and Fall Risk screenings as ordered, Perform wound care treatments  as ordered.  Evaluation of Outcomes: Progressing   LCSW Treatment Plan for Primary Diagnosis: <principal problem not specified> Long Term Goal(s): Safe transition to appropriate next level of care at discharge, Engage patient in therapeutic group addressing interpersonal concerns.  Short Term Goals: Engage patient in aftercare planning with referrals and resources, Increase social support and Increase skills for wellness and recovery  Therapeutic Interventions: Assess for all discharge needs, 1 to 1 time with Social worker, Explore available resources and support systems, Assess for adequacy in community support network, Educate family and significant other(s) on suicide prevention, Complete Psychosocial Assessment, Interpersonal group therapy.  Evaluation of Outcomes: Progressing   Progress in Treatment: Attending groups: Yes. Participating in groups: No. Taking medication as prescribed: Yes. Toleration medication: Yes. Family/Significant other contact made: No, will contact:  Benjamine Mola, friend, [phone] Patient understands diagnosis: No. Discussing patient identified problems/goals with staff: Yes. Medical problems stabilized or resolved: No. Denies suicidal/homicidal ideation: Yes. Issues/concerns per patient self-inventory: No. Other: None reported    New problem(s) identified: No, Describe:  None reported  New Short Term/Long Term Goal(s):   Patient Goals:  "School"  Discharge Plan or Barriers: None reported  Reason for Continuation of Hospitalization: Delusions  Medication stabilization  Estimated Length of Stay: 3-5 days  Attendees:  Patient: Jonathon Resides 09/04/2018 12:21 PM  Physician: Malvin Johns, MD 09/04/2018 12:21 PM  Nursing: Roddie Mc, RN 09/04/2018 12:21 PM  RN Care Manager: 09/04/2018 12:21 PM  Social Worker: Nobie Putnam, LCSW 09/04/2018 12:21 PM  Recreational Therapist:  09/04/2018 12:21 PM  Other: Marian Sorrow, MSW Intern 09/04/2018 12:21 PM   Other:  09/04/2018 12:21 PM  Other: 09/04/2018 12:21 PM    Scribe for Treatment Team: Marian Sorrow, MSW Intern 09/04/2018 12:40 PM
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
## 2018-09-04 NOTE — Progress Notes (Signed)
D: Pt alert and oriented. Pt rates depression 4/10, hopelessness 7/10, and anxiety 5/10.Pt goal: financial planning. Pt reports energy leave as normal and concentration as being good. Pt reports sleep last night as being good. Pt did not receive medications for sleep. Pt denies pain. Pt denies experiencing any SI/HI, or AVH at this time.   Pt has been very calm and cooperative. Pt was observed while he changed his ostomy bag this afternoon before dinner and was proved clean clothing.  A: Scheduled medications administered to pt, per MD orders. Support and encouragement provided. Frequent verbal contact made. Routine safety checks conducted q15 minutes.   R: No adverse drug reactions noted. Pt verbally contracts for safety at this time. Pt complaint with medications and treatment plan. Pt interacts well with others on the unit, however keeps mostly to himself in his room resting. Pt remains safe at this time. Will continue to monitor.

## 2018-09-04 NOTE — Progress Notes (Signed)
[facility] MD Progress Note  09/04/2018 9:01 AM Peter Golden  MRN:  [id number] Subjective:    Patient is generally passive stays to himself in bed he denies current auditory or visual hallucinations denies thoughts of harming himself states his long-term goal is to get back in school short-term goal of course to get out of the hospital but no thoughts of harming self no EPS no TD and again reporting resolution in the hallucinations but will be monitored through the weekend after long-acting injectable today  Principal Problem: Exacerbation and underlying schizophrenic disorder Diagnosis: Active Problems:   Schizophrenia, paranoid, chronic (HCC)  Total Time spent with patient: 20 minutes  Past Medical History:  Past Medical History:  Diagnosis Date  . Schizo affective schizophrenia (HCC)   . Seizures (HCC)     Past Surgical History:  Procedure Laterality Date  . IR THORACENTESIS ASP PLEURAL SPACE W/IMG GUIDE  04/22/2018  . LAPAROTOMY N/A 04/15/2018   Procedure: EXPLORATORY LAPAROTOMY with splenectomy, transverse colectomy, partial gastrectomy, repair of diaphragmatic hernia, left 14 Fr pigtail tube thoracostomy, repair of umbilical hernia;  Surgeon: Almond Lint, MD;  Location: [facility] OR;  Service: General;  Laterality: N/A;   Family History:  Family History  Problem Relation Age of Onset  . Mental illness Neg Hx    Family Psychiatric  History: see chart Social History:  Social History   Substance and Sexual Activity  Alcohol Use Not Currently   Comment: BAC was not available at time of assessment     Social History   Substance and Sexual Activity  Drug Use Not Currently   Comment: UDS not available at time of assessment    Social History   Socioeconomic History  . Marital status: Single    Spouse name: Not on file  . Number of children: Not on file  . Years of education: Not on file  . Highest education level: Not on file  Occupational History  . Not on file  Social  Needs  . Financial resource strain: Not on file  . Food insecurity:    Worry: Patient refused    Inability: Patient refused  . Transportation needs:    Medical: No    Non-medical: No  Tobacco Use  . Smoking status: Never Smoker  . Smokeless tobacco: Never Used  Substance and Sexual Activity  . Alcohol use: Not Currently    Comment: BAC was not available at time of assessment  . Drug use: Not Currently    Comment: UDS not available at time of assessment  . Sexual activity: Yes    Birth control/protection: None  Lifestyle  . Physical activity:    Days per week: 3 days    Minutes per session: 30 min  . Stress: Not at all  Relationships  . Social connections:    Talks on phone: Patient refused    Gets together: Patient refused    Attends religious service: Patient refused    Active member of club or organization: Patient refused    Attends meetings of clubs or organizations: Patient refused    Relationship status: Patient refused  Other Topics Concern  . Not on file  Social History Narrative   ** Merged History Encounter **       Additional Social History:                         Sleep: Fair  Appetite:  Fair  Current Medications: Current Facility-Administered Medications  Medication Dose  Route Frequency Provider Last Rate Last Dose  . acetaminophen (TYLENOL) tablet 650 mg  650 mg Oral Q6H PRN Laveda Abbe, NP      . alum & mag hydroxide-simeth (MAALOX/MYLANTA) 200-200-20 MG/5ML suspension 30 mL  30 mL Oral Q4H PRN Laveda Abbe, NP      . benztropine (COGENTIN) tablet 1 mg  1 mg Oral Daily Laveda Abbe, NP   1 mg at 09/04/18 0825  . gabapentin (NEURONTIN) capsule 300 mg  300 mg Oral TID Malvin Johns, MD   300 mg at 09/04/18 0825  . haloperidol (HALDOL) tablet 5 mg  5 mg Oral TID Malvin Johns, MD   5 mg at 09/04/18 0825  . haloperidol decanoate (HALDOL DECANOATE) 100 MG/ML injection 100 mg  100 mg Intramuscular Q30 days Malvin Johns,  MD      . hydrOXYzine (ATARAX/VISTARIL) tablet 25 mg  25 mg Oral TID PRN Laveda Abbe, NP      . levETIRAcetam (KEPPRA) tablet 500 mg  500 mg Oral BID Laveda Abbe, NP   500 mg at 09/04/18 0825  . LORazepam (ATIVAN) injection 1 mg  1 mg Intramuscular Once PRN Laveda Abbe, NP      . LORazepam (ATIVAN) tablet 1 mg  1 mg Oral Once PRN Laveda Abbe, NP      . magnesium hydroxide (MILK OF MAGNESIA) suspension 30 mL  30 mL Oral Daily PRN Laveda Abbe, NP      . traZODone (DESYREL) tablet 50 mg  50 mg Oral QHS PRN Laveda Abbe, NP      . ziprasidone (GEODON) injection 10 mg  10 mg Intramuscular Once Laveda Abbe, NP        Lab Results: No results found for this or any previous visit (from the past 48 hour(s)).  Blood Alcohol level:  Lab Results  Component Value Date   ETH <10 08/29/2018   ETH <10 07/18/2018    Metabolic Disorder Labs: Lab Results  Component Value Date   HGBA1C 5.2 12/15/2015   MPG 103 12/15/2015   MPG 111 07/13/2015   Lab Results  Component Value Date   PROLACTIN 26.7 (H) 12/15/2015   PROLACTIN 47.9 (H) 07/13/2015   Lab Results  Component Value Date   CHOL 261 (H) 12/15/2015   TRIG 63 04/15/2018   HDL 69 12/15/2015   CHOLHDL 3.8 12/15/2015   VLDL 13 12/15/2015   LDLCALC 179 (H) 12/15/2015   LDLCALC 181 (H) 07/13/2015    Physical Findings: AIMS: Facial and Oral Movements Muscles of Facial Expression: None, normal Lips and Perioral Area: None, normal Jaw: None, normal Tongue: None, normal,Extremity Movements Upper (arms, wrists, hands, fingers): None, normal Lower (legs, knees, ankles, toes): None, normal, Trunk Movements Neck, shoulders, hips: None, normal, Overall Severity Severity of abnormal movements (highest score from questions above): None, normal Incapacitation due to abnormal movements: None, normal Patient's awareness of abnormal movements (rate only patient's report): No Awareness,  Dental Status Current problems with teeth and/or dentures?: No Does patient usually wear dentures?: No  CIWA:    COWS:     Musculoskeletal: Strength & Muscle Tone: within normal limits Gait & Station: normal Patient leans: N/A  Psychiatric Specialty Exam: Physical Exam  ROS  Blood pressure 124/73, pulse (!) 103, temperature 97.7 F (36.5 C), temperature source Oral.There is no height or weight on file to calculate BMI.  General Appearance: Casual  Eye Contact:  Good  Speech:  Clear and Coherent  Volume:  Decreased  Mood:  Dysphoric  Affect:  Congruent  Thought Process:  Goal Directed  Orientation:  Full (Time, Place, and Person)  Thought Content:  Logical  Suicidal Thoughts:  No  Homicidal Thoughts:  No  Memory:  Immediate;   Good  Judgement:  Good  Insight:  Good  Psychomotor Activity:  Normal  Concentration:  Concentration: Good  Recall:  Good  Fund of Knowledge:  Good  Language:  Good  Akathisia:  Negative  Handed:  Right  AIMS (if indicated):     Assets:  Communication Skills Desire for Improvement  ADL's:  Intact  Cognition:  WNL  Sleep:  Number of Hours: 9.75     Treatment Plan Summary: Daily contact with patient to assess and evaluate symptoms and progress in treatment, Medication management and Plan No change in oral Haldol but administer long-acting injectable continue reality based therapy probable discharge on Monday  Jayana Kotula, MD 09/04/2018, 9:01 AM

## 2018-09-04 NOTE — Progress Notes (Signed)
D: Pt denies SI/HI/AVH. Pt is pleasant and cooperative. Pt stated he was feeling better . Pt was visible in the dayroom for a little while this evening.   A: Pt was offered support and encouragement. Pt was encourage to attend groups. Q 15 minute checks were done for safety.   R:Pt attends groups and interacts well with peers and staff. Pt is taking medication. Pt has no complaints.Pt receptive to treatment and safety maintained on unit.   Problem: Activity: Goal: Sleeping patterns will improve Outcome: Progressing   Problem: Coping: Goal: Ability to demonstrate self-control will improve Outcome: Progressing

## 2018-09-05 MED ORDER — BENZTROPINE MESYLATE 1 MG PO TABS
1.0000 mg | ORAL_TABLET | Freq: Two times a day (BID) | ORAL | Status: DC
  Administered 2018-09-06 – 2018-09-07 (×3): 1 mg via ORAL
  Filled 2018-09-05 (×4): qty 1
  Filled 2018-09-05: qty 14
  Filled 2018-09-05: qty 1
  Filled 2018-09-05: qty 14

## 2018-09-05 MED ORDER — HALOPERIDOL 5 MG PO TABS
5.0000 mg | ORAL_TABLET | Freq: Two times a day (BID) | ORAL | Status: DC
  Administered 2018-09-06 – 2018-09-07 (×3): 5 mg via ORAL
  Filled 2018-09-05 (×7): qty 1

## 2018-09-05 NOTE — [facility] Group Notes (Signed)
  [facility] LCSW Group Therapy Note  Date/Time:  09/05/2018  Type of Therapy and Topic:  Group Therapy:  Feelings About Hospitalization  Participation Level:  Active   Description of Group This process group involved patients discussing their feelings related to being hospitalized, as well as the benefits they see to being in the hospital.  These feelings and benefits were itemized.  The group then brainstormed specific ways in which they could seek those same benefits when they discharge and return home.  Therapeutic Goals 1. Patient will identify and describe positive and negative feelings related to hospitalization 2. Patient will verbalize benefits of hospitalization to themselves personally 3. Patients will brainstorm together ways they can obtain similar benefits in the outpatient setting, identify barriers to wellness and possible solutions  Summary of Patient Progress:  The patient expressed his primary feelings about being hospitalized are "good, I feel safe."  He said he gets his medicines on time and will go to his appointments when he gets out of the hospital.  He said a nurse will be visiting him every month in his home to give him his injections.  He did not understand some that was being said, and with permission CSW interpreted into [place] for him.  He said that he will put his medicines on the table where they can be seen so as to not forget to take them.  Therapeutic Modalities Cognitive Behavioral Therapy Motivational Interviewing    Ambrose Mantle, LCSW 09/05/2018, 5:41 PM

## 2018-09-05 NOTE — Plan of Care (Signed)
D: Patient presents depressed, withdrawn. This morning his speech was slurred and he seemed to move very slowly, but this improved during the day. He says he slept well. He is currently having SI, but denies plan or intent and contracts for safety. He is pleasant and cooperative. Patient denies HI/AVH.  A: Patient checked q15 min, and checks reviewed. Reviewed medication changes with patient and educated on side effects. Educated patient on importance of attending group therapy sessions and educated on several coping skills. Encouarged participation in milieu through recreation therapy and attending meals with peers. Support and encouragement provided. Fluids offered. R: Patient receptive to education on medications, and is medication compliant. Patient contracts for safety on the unit. Patient did not fill out a self-inventory.
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
## 2018-09-06 NOTE — Progress Notes (Signed)
Patient changed own ostomy. Supplies given as well as hygiene products.

## 2018-09-06 NOTE — Progress Notes (Signed)
Patient would like to be discharged tomorrow because he has to pay his rent. He said it is due on the fifth, and he needs to pay in order to avoid a late fee. Patient said he feels stable and is also denying any SI HI AVH.

## 2018-09-06 NOTE — Plan of Care (Signed)
D: Patient is in bed awake on approach. Reports he feels sleepy. Patient is pleasant and cooperative. Denies SI, HI, AVH, and verbally contracts for safety. Patient denies physical symptoms/pain. Came out of room once to ask for new colostomy bag and whet back to room then back to bed.    A: Support provided. Patient educated on safety on the unit and medications. Routine safety checks every 15 minutes. Patient stated understanding to tell nurse about any new physical symptoms. Patient understands to tell staff of any needs.     R: No adverse drug reactions noted. Patient verbally contracts for safety. Patient remains safe at this time and will continue to monitor.   Problem: Safety: Goal: Periods of time without injury will increase Outcome: Progressing  Patient remains safe will continue to monitor.

## 2018-09-06 NOTE — Plan of Care (Signed)
Patient self inventory- Patient slept well last night, sleep medication was not requested. Appetite is good, energy level normal,concentration good. Depression, hopelessness, and anxiety rated 4, 5, 4. Denies SI HI AVH. Endorses physical pain in her feet. Patient's goal is "financial planning and "school" will help him meet his goal.  Patient is compliant with medications prescribed per provider. No side effects noted. Safety is maintained with 15 minute checks as well as environmental checks. Will continue to monitor and provide support.  Problem: Education: Goal: Emotional status will improve Outcome: Progressing Goal: Mental status will improve Outcome: Progressing Goal: Verbalization of understanding the information provided will improve Outcome: Progressing   Problem: Activity: Goal: Interest or engagement in activities will improve Outcome: Progressing

## 2018-09-06 NOTE — Plan of Care (Addendum)
D: Patient at nurses station on approach and says he had a great day. Patient is alert, pleasant, and cooperative. Denies SI, HI, AVH, and verbally contracts for safety. 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 Patient denies physical symptoms/pain. Patient smiles during assessment.    A: PRN sleep medication administered per MD order. Support provided. Patient educated on safety on the unit and medications. Routine safety checks every 15 minutes. Patient stated understanding to tell nurse about any new physical symptoms. Patient understands to tell staff of any needs.     R: No adverse drug reactions noted. Patient verbally contracts for safety. Patient remains safe at this time and will continue to monitor.   Problem: Education: Goal: Mental status will improve Outcome: Progressing   Problem: Safety: Goal: Periods of time without injury will increase Outcome: Progressing   Problem: Health Behavior/Discharge Planning: Goal: Compliance with prescribed medication regimen will improve Outcome: Progressing   Patient denies SI, Hi, AVH, and contracts for safety. Patient willing to take medication as prescribed. Patient remains safe and will continue to monitor.

## 2018-09-06 NOTE — [facility] Group Notes (Addendum)
[facility] LCSW Group Therapy Note  Date/Time:  09/06/2018  11:00AM-12:00PM  Type of Therapy and Topic:  Group Therapy:  Music and Mood  Participation Level:  Did Not Attend   Description of Group: In this process group, members listened to a variety of genres of music and identified that different types of music evoke different responses.  Patients were encouraged to identify music that was soothing for them and music that was energizing for them.  Patients discussed how this knowledge can help with wellness and recovery in various ways including managing depression and anxiety as well as encouraging healthy sleep habits.    Therapeutic Goals: 1. Patients will explore the impact of different varieties of music on mood 2. Patients will verbalize the thoughts they have when listening to different types of music 3. Patients will identify music that is soothing to them as well as music that is energizing to them 4. Patients will discuss how to use this knowledge to assist in maintaining wellness and recovery 5. Patients will explore the use of music as a coping skill  Summary of Patient Progress:  N/A  Therapeutic Modalities: Solution Focused Brief Therapy Activity   Tzipora Mcinroy Grossman-Orr, LCSW    
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
## 2018-09-07 MED ORDER — HALOPERIDOL 0.5 MG PO TABS
1.0000 mg | ORAL_TABLET | Freq: Three times a day (TID) | ORAL | Status: DC
  Filled 2018-09-07 (×3): qty 42

## 2018-09-07 MED ORDER — BENZTROPINE MESYLATE 1 MG PO TABS: 1.0000 mg | ORAL_TABLET | Freq: Two times a day (BID) | ORAL | 2 refills | Status: DC

## 2018-09-07 MED ORDER — MIRTAZAPINE 15 MG PO TABS: 15.0000 mg | ORAL_TABLET | Freq: Every day | ORAL | 2 refills | Status: DC

## 2018-09-07 MED ORDER — MIRTAZAPINE 15 MG PO TABS
15.0000 mg | ORAL_TABLET | Freq: Every day | ORAL | Status: DC
  Filled 2018-09-07: qty 7

## 2018-09-07 MED ORDER — HALOPERIDOL DECANOATE 100 MG/ML IM SOLN: 100.0000 mg | INTRAMUSCULAR | 11 refills | Status: DC

## 2018-09-07 MED ORDER — HALOPERIDOL 1 MG PO TABS: 1.0000 mg | ORAL_TABLET | Freq: Three times a day (TID) | ORAL | 2 refills | Status: DC

## 2018-09-07 MED ORDER — GABAPENTIN 300 MG PO CAPS: 300.0000 mg | ORAL_CAPSULE | Freq: Three times a day (TID) | ORAL | 2 refills | Status: DC

## 2018-09-07 MED ORDER — HALOPERIDOL 5 MG PO TABS: 5.0000 mg | ORAL_TABLET | Freq: Two times a day (BID) | ORAL | Status: DC

## 2018-09-07 NOTE — Progress Notes (Signed)
Patient ID: Peter Golden, male   DOB: 1981/02/17, 38 y.o.   MRN: [id number]  D: Pt alert and oriented on the unit.   A: Education, support, and encouragement provided. Discharge summary, medications and follow up appointments reviewed with pt. Suicide prevention resources provided, including "My 3 App." Pt's belongings in locker returned and belongings sheet signed.  R: Pt denies SI/HI, A/VH, pain, or any concerns at this time. Pt ambulatory on and off unit. Pt discharged to lobby.

## 2018-09-07 NOTE — Progress Notes (Addendum)
CSW spoke to [place] and [place] at [employer].  They do have opening for IPRS--Denise can come by around lunch to meet with pt. Winferd Humphrey, MSW, LCSW Clinical Social Worker 09/07/2018 10:03 AM   Langley Gauss from TCT met with pt, who did agree to received TCT services.  Pt signed consents and they will move forward. Winferd Humphrey, MSW, LCSW Clinical Social Worker 09/07/2018 1:39 PM

## 2018-09-07 NOTE — Progress Notes (Signed)
Recreation Therapy Notes  Date: 3.9.20 Time: 1000 Location: 500 Hall Dayroom  Group Topic: Coping Skills  Goal Area(s) Addresses:  Patient will identify positive coping skills. Patient will identify benefits of using coping skills post d/c.  Intervention: Worksheet  Activity: Coping Skills A to Z.  Patients were to identify coping skills for each letter of the alphabet individually.   As a group, patients and LRT would identify any coping skills for letters that were still blank.   Education: Pharmacologist, Building control surveyor.   Education Outcome: Acknowledges understanding/In group clarification offered/Needs additional education.   Clinical Observations/Feedback: Pt did not attend group.    [name], LRT/CTRS         [name], Shriley Joffe A 09/07/2018 11:00 AM
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
## 2018-09-07 NOTE — [facility] Suicide Risk Assessment (Signed)
[facility] Discharge Suicide Risk Assessment   Principal Problem: Noncompliance with meds and exacerbation and psychotic disorder/schizophrenia Discharge Diagnoses: Active Problems:   Schizophrenia, paranoid, chronic (HCC)   Total Time spent with patient: 45 minutes  Alert and oriented to person place situation and time seems to have recalibrated to his baseline, no thoughts of harming self or others and contracting fully No acute hallucinations, no delusions  Mental Status Per Nursing Assessment::   On Admission:  NA  Demographic Factors:  Male  Loss Factors: NA  Historical Factors: NA  Risk Reduction Factors:   Sense of responsibility to family and Positive social support  Continued Clinical Symptoms:  Schizophrenia:   Less than 30 years old  Cognitive Features That Contribute To Risk:  None    Suicide Risk:  Minimal: No identifiable suicidal ideation.  Patients presenting with no risk factors but with morbid ruminations; may be classified as minimal risk based on the severity of the depressive symptoms    Plan Of Care/Follow-up recommendations:  Activity:  full  Peter Pipkins, MD 09/07/2018, 9:17 AM

## 2018-09-07 NOTE — Progress Notes (Signed)
  [facility] Adult Case Management Discharge Plan :  Will you be returning to the same living situation after discharge:  Yes,  own apartment At discharge, do you have transportation home?: No. Bus pass provided.  Do you have the ability to pay for your medications: No. Will work with [employer].   Release of information consent forms completed and in the chart;  Patient's signature needed at discharge.  Patient to Follow up at: Follow-up Information    [facility] Follow up on 09/10/2018.   Why:  Hospital follow up appointment is Thursday, 3/12 at 8:15a. You have also been referred to Transition Care Team.  You will be contacted for a home visit by Mauri Brooklyn, [phone] or Estelle June, [phone]. Contact information: [address] [place] [address] [phone]           Next level of care provider has access to [facility] Link:no  Safety Planning and Suicide Prevention discussed: Yes,  with friend  Have you used any form of tobacco in the last 30 days? (Cigarettes, Smokeless Tobacco, Cigars, and/or Pipes): Patient Refused Screening  Has patient been referred to the Quitline?: N/A patient is not a smoker  Patient has been referred for addiction treatment: N/A  Lorri Frederick, LCSW 09/07/2018, 1:14 PM

## 2018-11-11 ENCOUNTER — Telehealth: Payer: Self-pay

## 2018-11-11 NOTE — Telephone Encounter (Signed)
Patient presented to clinic today in need of colostomy supplies.  He was last seen at [facility] 05/2018. An appointment was scheduled for him for  tomorrow 11/12/2018 to establish care with Dr Jillyn Hidden. Stressed with him the importance of keeping this appointment.  When offered an interpreter, he refused.   he was provided 2 ostomy bags and wafers. He said that he changes the ostomy bag every 1-2 weeks. Provided him with the contact # for [facility] to obtain additional supplies through their indigent program.  # [phone].

## 2018-11-12 ENCOUNTER — Encounter: Payer: Self-pay | Admitting: Family Medicine

## 2018-11-12 ENCOUNTER — Telehealth: Payer: Self-pay

## 2018-11-12 ENCOUNTER — Ambulatory Visit (HOSPITAL_BASED_OUTPATIENT_CLINIC_OR_DEPARTMENT_OTHER): Payer: Self-pay | Admitting: Family Medicine

## 2018-11-12 DIAGNOSIS — Z79899 Other long term (current) drug therapy: Secondary | ICD-10-CM | POA: Insufficient documentation

## 2018-11-12 DIAGNOSIS — Z933 Colostomy status: Secondary | ICD-10-CM | POA: Insufficient documentation

## 2018-11-12 DIAGNOSIS — R7309 Other abnormal glucose: Secondary | ICD-10-CM | POA: Insufficient documentation

## 2018-11-12 DIAGNOSIS — Z903 Acquired absence of stomach [part of]: Secondary | ICD-10-CM | POA: Insufficient documentation

## 2018-11-12 DIAGNOSIS — Z87828 Personal history of other (healed) physical injury and trauma: Secondary | ICD-10-CM | POA: Insufficient documentation

## 2018-11-12 DIAGNOSIS — R17 Unspecified jaundice: Secondary | ICD-10-CM | POA: Insufficient documentation

## 2018-11-12 DIAGNOSIS — F258 Other schizoaffective disorders: Secondary | ICD-10-CM

## 2018-11-12 DIAGNOSIS — E876 Hypokalemia: Secondary | ICD-10-CM | POA: Insufficient documentation

## 2018-11-12 MED ORDER — CLOSED-END COLOSTOMY POUCH MISC: 11 refills | Status: DC
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
## 2018-11-12 NOTE — Telephone Encounter (Signed)
Spoke with patient and to ask him if he had insurance due to office not having an insurance on file for him. Per pt, he do not have medical insurance. Staff then asked patient if he had Medicaid and he stated no he do not have Medicaid. Information was verbalized to Erskine Squibb and she suggested to give staff gived patient Catha Gosselin number to patient to call about his Colostomy supply. Number was provided to patient to call [phone] opt [phone] and to let them know he do no have insurance and he's trying to get Colostomy supplies. Patient verbalized understanding.

## 2018-11-12 NOTE — Telephone Encounter (Signed)
As per Dr Jillyn Hidden, Peter Golden stated that he had been receiving colostomy supplies from [facility].  Call placed to [phone]  to confirm his insurance as there is no insurance on file in [place]. Message left requesting a call back to this CM # [phone]

## 2018-11-12 NOTE — Progress Notes (Signed)
Establish Care. Per patient he is on Colostomy bag and he is needing more of that. Per pt he do not remember the name of the doctor that put him on the bag. Per patient chart, he received Colostomy care at ED back on 06-17-2018. Per pt [facility] has been following him to make sure everything is okay. They told him they don't have the Colostomy bag anymore 2 months ago.

## 2018-12-29 ENCOUNTER — Ambulatory Visit: Payer: Self-pay | Admitting: General Surgery
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
## 2019-03-05 ENCOUNTER — Encounter (HOSPITAL_COMMUNITY): Admission: RE | Admit: 2019-03-05 | Payer: Self-pay | Source: Ambulatory Visit

## 2019-03-09 ENCOUNTER — Other Ambulatory Visit (HOSPITAL_COMMUNITY): Payer: Self-pay

## 2019-03-25 NOTE — Patient Instructions (Addendum)
DUE TO COVID-19 ONLY ONE VISITOR IS ALLOWED TO COME WITH YOU AND STAY IN THE WAITING ROOM ONLY DURING PRE OP   AND PROCEDURE DAY OF SURGERY. THE 1 VISITOR MAY VISIT WITH YOU AFTER SURGERY IN YOUR PRIVATE ROOM DURING  VISITING HOURS ONLY!  YOU NEED TO HAVE A COVID 19 TEST ON__Sat 9/26_____ , THIS TEST MUST BE DONE BEFORE SURGERY, COME  [address], [place] , [id number].  [facility])   ONCE YOUR COVID TEST IS COMPLETED, PLEASE BEGIN THE QUARANTINE INSTRUCTIONS AS OUTLINED IN YOUR HANDOUT.                Tarik Kokou Lamantia   Your procedure is scheduled on: Wednesday 03/31/19    Report to [facility] Main  Entrance Report to admitting at   10:00 AM      Call this number if you have problems the morning of surgery [phone]    Bring your ostomy supplies with you to the hospital    BRUSH YOUR TEETH MORNING OF SURGERY AND RINSE YOUR MOUTH OUT, NO CHEWING GUM CANDY OR MINTS.   Light diet the day before surgery. Drink lots of liquids to prevent dehydration.    Follow bowel prep from Dr. Maurine Minister office    DRINK 2 PRESURGERY ENSURE DRINKS THE NIGHT BEFORE SURGERY AT   1000 PM   NO SOLID FOOD AFTER MIDNIGHT.   CLEAR LIQUIDS UNTIL  9:00AM   CLEAR LIQUID DIET   Foods Allowed                                                                     Foods Excluded  Coffee and tea, regular and liquids that you cannot  Plain Jell-O any favor except red or purple            decaf                                                             see through such as: Fruit ices (not with fruit pulp)                                     milk, soups, orange juice  Iced Popsicles                                    All solid food Carbonated beverages, regular and diet                                    Cranberry, grape and apple juices Sports drinks like Gatorade Lightly seasoned clear broth or consume(fat free) Sugar, honey syrup  AT 9;00 AM  DRINK THE LAST ENSURE THEN NOTHING MORE BY MOUTH.    Take these medicines the morning of surgery with A SIP OF WATER:  Gabapentin and Haldol                                 You may not have any metal on your body including piercings            Do not wear jewelry,  lotions, powders or , deodorant                      Men may shave face and neck.   Do not bring valuables to the hospital. [place].  Contacts, dentures or bridgework may not be worn into surgery.  Leave suitcase in the car. After surgery it may be brought to your room.       Name and phone number of your driver:  Special Instructions: N/A              Please read over the following fact sheets you were given: _____________________________________________________________________             [facility] - Preparing for Surgery  Before surgery, you can play an important role.   Because skin is not sterile, your skin needs to be as free of germs as possible.   You can reduce the number of germs on your skin by washing with CHG (chlorahexidine gluconate) soap before surgery.   CHG is an antiseptic cleaner which kills germs and bonds with the skin to continue killing germs even after washing. Please DO NOT use if you have an allergy to CHG or antibacterial soaps.   If your skin becomes reddened/irritated stop using the CHG and inform your nurse when you arrive at Short Stay.   You may shave your face/neck. Please follow these instructions carefully:  1.  Shower with CHG Soap the night before surgery and the  morning of Surgery.  2.  If you choose to wash your hair, wash your hair first as usual with your  normal  shampoo.  3.  After you shampoo, rinse your hair and body thoroughly to remove the  shampoo.                                        4.  Use CHG as you would any other liquid soap.  You can apply chg directly  to the skin and wash                       Gently  with a scrungie or clean washcloth.  5.  Apply the CHG Soap to your body ONLY FROM THE NECK DOWN.   Do not use on face/ open                           Wound or open sores. Avoid contact with eyes, ears mouth and genitals (private parts).                       Wash face,  Genitals (private parts) with your normal soap.             6.  Wash thoroughly, paying special attention to the area where your surgery  will be performed.  7.  Thoroughly rinse your body with warm water from the neck down.  8.  DO NOT shower/wash with your normal soap after using and rinsing off  the CHG Soap.             9.  Pat yourself dry with a clean towel.            10.  Wear clean pajamas.            11.  Place clean sheets on your bed the night of your first shower and do not  sleep with pets. Day of Surgery : Do not apply any lotions/deodorants the morning of surgery.  Please wear clean clothes to the hospital/surgery center.  FAILURE TO FOLLOW THESE INSTRUCTIONS MAY RESULT IN THE CANCELLATION OF YOUR SURGERY PATIENT SIGNATURE_________________________________  NURSE SIGNATURE__________________________________  ________________________________________________________________________   Rogelia MireIncentive Spirometer  An incentive spirometer is a tool that can help keep your lungs clear and active. This tool measures how well you are filling your lungs with each breath. Taking long deep breaths may help reverse or decrease the chance of developing breathing (pulmonary) problems (especially infection) following:  A long period of time when you are unable to move or be active. BEFORE THE PROCEDURE   If the spirometer includes an indicator to show your best effort, your nurse or respiratory therapist will set it to a desired goal.  If possible, sit up straight or lean slightly forward. Try not to slouch.  Hold the incentive spirometer in an upright position. INSTRUCTIONS FOR USE  1. Sit on the edge of your bed if  possible, or sit up as far as you can in bed or on a chair. 2. Hold the incentive spirometer in an upright position. 3. Breathe out normally. 4. Place the mouthpiece in your mouth and seal your lips tightly around it. 5. Breathe in slowly and as deeply as possible, raising the piston or the ball toward the top of the column. 6. Hold your breath for 3-5 seconds or for as long as possible. Allow the piston or ball to fall to the bottom of the column. 7. Remove the mouthpiece from your mouth and breathe out normally. 8. Rest for a few seconds and repeat Steps 1 through 7 at least 10 times every 1-2 hours when you are awake. Take your time and take a few normal breaths between deep breaths. 9. The spirometer may include an indicator to show your best effort. Use the indicator as a goal to work toward during each repetition. 10. After each set of 10 deep breaths, practice coughing to be sure your lungs are clear. If you have an incision (the cut made at the time of surgery), support your incision when coughing by placing a pillow or rolled up towels firmly against it. Once you are able to get out of bed, walk around indoors and cough well. You may stop using the incentive spirometer when instructed by your caregiver.  RISKS AND COMPLICATIONS  Take your time so you do not get dizzy or light-headed.  If you are in pain, you may need to take or ask for pain medication before doing incentive spirometry. It is harder to take a deep breath if you are having pain. AFTER USE  Rest and breathe slowly and easily.  It can be helpful to keep track of a log of your progress. Your caregiver can provide you with a simple table to help with this. If you are using the spirometer at home, follow  these instructions: SEEK MEDICAL CARE IF:   You are having difficultly using the spirometer.  You have trouble using the spirometer as often as instructed.  Your pain medication is not giving enough relief while using  the spirometer.  You develop fever of 100.5 F (38.1 C) or higher. SEEK IMMEDIATE MEDICAL CARE IF:   You cough up bloody sputum that had not been present before.  You develop fever of 102 F (38.9 C) or greater.  You develop worsening pain at or near the incision site. MAKE SURE YOU:   Understand these instructions.  Will watch your condition.  Will get help right away if you are not doing well or get worse. Document Released: 10/28/2006 Document Revised: 09/09/2011 Document Reviewed: 12/29/2006 [facility] Patient Information 2014 [place], [place].   ________________________________________________________________________

## 2019-03-26 ENCOUNTER — Encounter (HOSPITAL_COMMUNITY)
Admission: RE | Admit: 2019-03-26 | Discharge: 2019-03-26 | Disposition: A | Discharge status: Home / Self Care | Payer: Worker's Compensation | Source: Ambulatory Visit | Attending: General Surgery | Admitting: General Surgery

## 2019-03-26 ENCOUNTER — Other Ambulatory Visit: Payer: Self-pay

## 2019-03-26 ENCOUNTER — Encounter (HOSPITAL_COMMUNITY): Payer: Self-pay

## 2019-03-26 DIAGNOSIS — Z01812 Encounter for preprocedural laboratory examination: Secondary | ICD-10-CM | POA: Insufficient documentation

## 2019-03-26 LAB — BASIC METABOLIC PANEL
Anion gap: 9 (ref 5–15)
BUN: 7 mg/dL (ref 6–20)
CO2: 27 mmol/L (ref 22–32)
Calcium: 9.7 mg/dL (ref 8.9–10.3)
Chloride: 103 mmol/L (ref 98–111)
Creatinine, Ser: 0.98 mg/dL (ref 0.61–1.24)
GFR calc Af Amer: >60 mL/min (ref >60)
GFR calc non Af Amer: >60 mL/min (ref >60)
Glucose, Bld: 95 mg/dL (ref 70–99)
Potassium: 4.9 mmol/L (ref 3.5–5.1)
Sodium: 139 mmol/L (ref 135–145)

## 2019-03-26 LAB — CBC
HCT: 48.8 % (ref 39.0–52.0)
Hemoglobin: 16.2 g/dL (ref 13.0–17.0)
MCH: 32.3 pg (ref 26.0–34.0)
MCHC: 33.2 g/dL (ref 30.0–36.0)
MCV: 97.4 fL (ref 80.0–100.0)
Platelets: 295 10*3/uL (ref 150–400)
RBC: 5.01 MIL/uL (ref 4.22–5.81)
RDW: 15.2 % (ref 11.5–15.5)
WBC: 6.9 10*3/uL (ref 4.0–10.5)
nRBC: 0 % (ref 0.0–0.2)

## 2019-03-26 NOTE — Progress Notes (Signed)
PCP - Dr. Marcello Moores Cardiologist - none  Chest x-ray - 08/29/18 EKG - 08/29/18 Stress Test - no ECHO - no Cardiac Cath - no  Sleep Study - no CPAP -   Fasting Blood Sugar - NA Checks Blood Sugar _____ times a day  Blood Thinner Instructions:  NA Aspirin Instructions: Last Dose:  Anesthesia review:   Patient denies shortness of breath, fever, cough and chest pain at PAT appointment yes  Patient verbalized understanding of instructions that were given to them at the PAT appointment. Patient was also instructed that they will need to review over the PAT instructions again at home before surgery. Yes Dr. Manon Hilding office called to confirm if pt needed bowel prep. Instructions for bowel prep and pre op were reviewed with the help of the interpreter at the PAT visit

## 2019-03-27 ENCOUNTER — Other Ambulatory Visit (HOSPITAL_COMMUNITY)
Admission: RE | Admit: 2019-03-27 | Discharge: 2019-03-27 | Disposition: A | Discharge status: Home / Self Care | Payer: Worker's Compensation | Source: Ambulatory Visit | Attending: General Surgery | Admitting: General Surgery

## 2019-03-28 LAB — NOVEL CORONAVIRUS, NAA (HOSP ORDER, SEND-OUT TO REF LAB; TAT 18-24 HRS): SARS-CoV-2, NAA: NOT DETECTED

## 2019-03-30 MED ORDER — BUPIVACAINE LIPOSOME 1.3 % IJ SUSP
20.0000 mL | INTRAMUSCULAR | Status: DC
  Filled 2019-03-30: qty 20

## 2019-03-31 ENCOUNTER — Other Ambulatory Visit: Payer: Self-pay

## 2019-03-31 ENCOUNTER — Encounter (HOSPITAL_COMMUNITY): Payer: Self-pay

## 2019-03-31 ENCOUNTER — Inpatient Hospital Stay (HOSPITAL_COMMUNITY): Payer: Worker's Compensation | Admitting: Physician Assistant

## 2019-03-31 ENCOUNTER — Inpatient Hospital Stay (HOSPITAL_COMMUNITY)
Admission: RE | Admit: 2019-03-31 | Discharge: 2019-04-06 | DRG: 329 | Disposition: A | Discharge status: Home / Self Care | Payer: Worker's Compensation | Attending: General Surgery | Admitting: General Surgery

## 2019-03-31 ENCOUNTER — Encounter (HOSPITAL_COMMUNITY)
Admission: RE | Disposition: A | Discharge status: Home / Self Care | Payer: Self-pay | Source: Home / Self Care | Attending: General Surgery

## 2019-03-31 ENCOUNTER — Inpatient Hospital Stay (HOSPITAL_COMMUNITY): Payer: Worker's Compensation | Admitting: Anesthesiology

## 2019-03-31 DIAGNOSIS — R0902 Hypoxemia: Secondary | ICD-10-CM | POA: Diagnosis not present

## 2019-03-31 DIAGNOSIS — Z20828 Contact with and (suspected) exposure to other viral communicable diseases: Secondary | ICD-10-CM | POA: Diagnosis present

## 2019-03-31 DIAGNOSIS — Z433 Encounter for attention to colostomy: Secondary | ICD-10-CM | POA: Diagnosis present

## 2019-03-31 DIAGNOSIS — J189 Pneumonia, unspecified organism: Secondary | ICD-10-CM | POA: Diagnosis not present

## 2019-03-31 DIAGNOSIS — Z9081 Acquired absence of spleen: Secondary | ICD-10-CM | POA: Diagnosis not present

## 2019-03-31 DIAGNOSIS — K432 Incisional hernia without obstruction or gangrene: Secondary | ICD-10-CM | POA: Diagnosis present

## 2019-03-31 DIAGNOSIS — F200 Paranoid schizophrenia: Secondary | ICD-10-CM | POA: Diagnosis present

## 2019-03-31 DIAGNOSIS — E785 Hyperlipidemia, unspecified: Secondary | ICD-10-CM | POA: Diagnosis present

## 2019-03-31 DIAGNOSIS — Z933 Colostomy status: Secondary | ICD-10-CM

## 2019-03-31 DIAGNOSIS — K660 Peritoneal adhesions (postprocedural) (postinfection): Secondary | ICD-10-CM | POA: Diagnosis present

## 2019-03-31 DIAGNOSIS — R509 Fever, unspecified: Secondary | ICD-10-CM

## 2019-03-31 HISTORY — PX: XI ROBOTIC ASSISTED COLOSTOMY TAKEDOWN: SHX6828

## 2019-03-31 SURGERY — CLOSURE, COLOSTOMY, ROBOT-ASSISTED
Anesthesia: General | Site: Abdomen

## 2019-03-31 MED ORDER — GABAPENTIN 300 MG PO CAPS
300.0000 mg | ORAL_CAPSULE | Freq: Two times a day (BID) | ORAL | Status: DC
  Administered 2019-03-31 – 2019-04-06 (×12): 300 mg via ORAL
  Filled 2019-03-31 (×12): qty 1

## 2019-03-31 MED ORDER — HYDROMORPHONE HCL 1 MG/ML IJ SOLN
0.2500 mg | INTRAMUSCULAR | Status: DC | PRN
  Administered 2019-03-31: 0.5 mg via INTRAVENOUS

## 2019-03-31 MED ORDER — 0.9 % SODIUM CHLORIDE (POUR BTL) OPTIME
TOPICAL | Status: DC | PRN
  Administered 2019-03-31: 13:00:00 2000 mL

## 2019-03-31 MED ORDER — ONDANSETRON HCL 4 MG PO TABS
4.0000 mg | ORAL_TABLET | Freq: Four times a day (QID) | ORAL | Status: DC | PRN
  Filled 2019-03-31: qty 1

## 2019-03-31 MED ORDER — BUPIVACAINE HCL (PF) 0.25 % IJ SOLN
INTRAMUSCULAR | Status: AC
  Filled 2019-03-31: qty 30

## 2019-03-31 MED ORDER — SUCCINYLCHOLINE CHLORIDE 200 MG/10ML IV SOSY
PREFILLED_SYRINGE | INTRAVENOUS | Status: DC | PRN
  Administered 2019-03-31: 140 mg via INTRAVENOUS

## 2019-03-31 MED ORDER — OXYCODONE HCL 5 MG PO TABS: 5.0000 mg | ORAL_TABLET | Freq: Once | ORAL | Status: DC | PRN

## 2019-03-31 MED ORDER — MIDAZOLAM HCL 5 MG/5ML IJ SOLN
INTRAMUSCULAR | Status: DC | PRN
  Administered 2019-03-31: 2 mg via INTRAVENOUS

## 2019-03-31 MED ORDER — ALVIMOPAN 12 MG PO CAPS
12.0000 mg | ORAL_CAPSULE | ORAL | Status: AC
  Administered 2019-03-31: 10:00:00 12 mg via ORAL
  Filled 2019-03-31: qty 1

## 2019-03-31 MED ORDER — ALUM & MAG HYDROXIDE-SIMETH 200-200-20 MG/5ML PO SUSP: 30.0000 mL | Freq: Four times a day (QID) | ORAL | Status: DC | PRN

## 2019-03-31 MED ORDER — ROCURONIUM BROMIDE 10 MG/ML (PF) SYRINGE
PREFILLED_SYRINGE | INTRAVENOUS | Status: AC
  Filled 2019-03-31: qty 10

## 2019-03-31 MED ORDER — TRAMADOL HCL 50 MG PO TABS
50.0000 mg | ORAL_TABLET | Freq: Four times a day (QID) | ORAL | Status: DC | PRN
  Administered 2019-04-01: 50 mg via ORAL
  Filled 2019-03-31: qty 1

## 2019-03-31 MED ORDER — ROCURONIUM BROMIDE 10 MG/ML (PF) SYRINGE
PREFILLED_SYRINGE | INTRAVENOUS | Status: DC | PRN
  Administered 2019-03-31: 10 mg via INTRAVENOUS
  Administered 2019-03-31: 65 mg via INTRAVENOUS
  Administered 2019-03-31: 10 mg via INTRAVENOUS
  Administered 2019-03-31: 30 mg via INTRAVENOUS
  Administered 2019-03-31: 10 mg via INTRAVENOUS
  Administered 2019-03-31: 5 mg via INTRAVENOUS
  Administered 2019-03-31: 30 mg via INTRAVENOUS

## 2019-03-31 MED ORDER — MIDAZOLAM HCL 2 MG/2ML IJ SOLN
INTRAMUSCULAR | Status: AC
  Filled 2019-03-31: qty 2

## 2019-03-31 MED ORDER — HEPARIN SODIUM (PORCINE) 5000 UNIT/ML IJ SOLN
5000.0000 [IU] | Freq: Once | INTRAMUSCULAR | Status: AC
  Administered 2019-03-31: 5000 [IU] via SUBCUTANEOUS
  Filled 2019-03-31: qty 1

## 2019-03-31 MED ORDER — PROPOFOL 10 MG/ML IV BOLUS
INTRAVENOUS | Status: AC
  Filled 2019-03-31: qty 20

## 2019-03-31 MED ORDER — PROMETHAZINE HCL 25 MG/ML IJ SOLN: 6.2500 mg | INTRAMUSCULAR | Status: DC | PRN

## 2019-03-31 MED ORDER — ENOXAPARIN SODIUM 40 MG/0.4ML ~~LOC~~ SOLN
40.0000 mg | SUBCUTANEOUS | Status: DC
  Administered 2019-04-01 – 2019-04-06 (×6): 40 mg via SUBCUTANEOUS
  Filled 2019-03-31 (×6): qty 0.4

## 2019-03-31 MED ORDER — BUPIVACAINE HCL (PF) 0.25 % IJ SOLN
INTRAMUSCULAR | Status: DC | PRN
  Administered 2019-03-31: 30 mL

## 2019-03-31 MED ORDER — KCL IN DEXTROSE-NACL 20-5-0.45 MEQ/L-%-% IV SOLN
INTRAVENOUS | Status: DC
  Administered 2019-03-31 – 2019-04-01 (×2): via INTRAVENOUS
  Filled 2019-03-31 (×2): qty 1000

## 2019-03-31 MED ORDER — DEXAMETHASONE SODIUM PHOSPHATE 10 MG/ML IJ SOLN
INTRAMUSCULAR | Status: AC
  Filled 2019-03-31: qty 1

## 2019-03-31 MED ORDER — OXYCODONE HCL 5 MG/5ML PO SOLN: 5.0000 mg | Freq: Once | ORAL | Status: DC | PRN

## 2019-03-31 MED ORDER — GABAPENTIN 300 MG PO CAPS
300.0000 mg | ORAL_CAPSULE | ORAL | Status: AC
  Administered 2019-03-31: 300 mg via ORAL
  Filled 2019-03-31: qty 1

## 2019-03-31 MED ORDER — SACCHAROMYCES BOULARDII 250 MG PO CAPS
250.0000 mg | ORAL_CAPSULE | Freq: Two times a day (BID) | ORAL | Status: DC
  Administered 2019-03-31 – 2019-04-06 (×12): 250 mg via ORAL
  Filled 2019-03-31 (×12): qty 1

## 2019-03-31 MED ORDER — LACTATED RINGERS IV SOLN
INTRAVENOUS | Status: DC
  Administered 2019-03-31 (×4): via INTRAVENOUS

## 2019-03-31 MED ORDER — SODIUM CHLORIDE 0.9 % IV SOLN
2.0000 g | INTRAVENOUS | Status: AC
  Administered 2019-03-31: 12:00:00 2 g via INTRAVENOUS
  Filled 2019-03-31: qty 2

## 2019-03-31 MED ORDER — ONDANSETRON HCL 4 MG/2ML IJ SOLN
INTRAMUSCULAR | Status: DC | PRN
  Administered 2019-03-31: 4 mg via INTRAVENOUS

## 2019-03-31 MED ORDER — PROPOFOL 10 MG/ML IV BOLUS
INTRAVENOUS | Status: DC | PRN
  Administered 2019-03-31: 50 mg via INTRAVENOUS
  Administered 2019-03-31 (×2): 30 mg via INTRAVENOUS
  Administered 2019-03-31: 200 mg via INTRAVENOUS

## 2019-03-31 MED ORDER — INFLUENZA VAC SPLIT QUAD 0.5 ML IM SUSY: 0.5000 mL | PREFILLED_SYRINGE | INTRAMUSCULAR | Status: DC

## 2019-03-31 MED ORDER — BUPIVACAINE LIPOSOME 1.3 % IJ SUSP
INTRAMUSCULAR | Status: DC | PRN
  Administered 2019-03-31: 20 mL

## 2019-03-31 MED ORDER — LIDOCAINE 2% (20 MG/ML) 5 ML SYRINGE
INTRAMUSCULAR | Status: DC | PRN
  Administered 2019-03-31: 75 mg via INTRAVENOUS

## 2019-03-31 MED ORDER — SUGAMMADEX SODIUM 200 MG/2ML IV SOLN
INTRAVENOUS | Status: DC | PRN
  Administered 2019-03-31: 200 mg via INTRAVENOUS

## 2019-03-31 MED ORDER — INDOCYANINE GREEN 25 MG IV SOLR
INTRAVENOUS | Status: DC | PRN
  Administered 2019-03-31: 3 mg via INTRAVENOUS

## 2019-03-31 MED ORDER — ONDANSETRON HCL 4 MG/2ML IJ SOLN
4.0000 mg | Freq: Four times a day (QID) | INTRAMUSCULAR | Status: DC | PRN
  Administered 2019-04-01 – 2019-04-05 (×4): 4 mg via INTRAVENOUS
  Filled 2019-03-31 (×3): qty 2

## 2019-03-31 MED ORDER — ACETAMINOPHEN 500 MG PO TABS
1000.0000 mg | ORAL_TABLET | Freq: Once | ORAL | Status: AC
  Filled 2019-03-31: qty 2

## 2019-03-31 MED ORDER — ENSURE SURGERY PO LIQD
237.0000 mL | Freq: Two times a day (BID) | ORAL | Status: DC
  Administered 2019-04-01 – 2019-04-03 (×3): 237 mL via ORAL
  Filled 2019-03-31 (×12): qty 237

## 2019-03-31 MED ORDER — ACETAMINOPHEN 500 MG PO TABS
1000.0000 mg | ORAL_TABLET | ORAL | Status: AC
  Administered 2019-03-31: 1000 mg via ORAL

## 2019-03-31 MED ORDER — LACTATED RINGERS IR SOLN
Status: DC | PRN
  Administered 2019-03-31: 2000 mL

## 2019-03-31 MED ORDER — HYDROMORPHONE HCL 1 MG/ML IJ SOLN
0.5000 mg | INTRAMUSCULAR | Status: DC | PRN
  Administered 2019-03-31 – 2019-04-01 (×4): 0.5 mg via INTRAVENOUS
  Filled 2019-03-31 (×4): qty 0.5

## 2019-03-31 MED ORDER — PHENYLEPHRINE HCL (PRESSORS) 10 MG/ML IV SOLN: INTRAVENOUS | Status: DC | PRN

## 2019-03-31 MED ORDER — LIDOCAINE HCL 2 % IJ SOLN
INTRAMUSCULAR | Status: AC
  Filled 2019-03-31: qty 20

## 2019-03-31 MED ORDER — ALVIMOPAN 12 MG PO CAPS
12.0000 mg | ORAL_CAPSULE | Freq: Two times a day (BID) | ORAL | Status: DC
  Administered 2019-04-01 – 2019-04-02 (×3): 12 mg via ORAL
  Filled 2019-03-31 (×7): qty 1

## 2019-03-31 MED ORDER — SUFENTANIL CITRATE 50 MCG/ML IV SOLN
INTRAVENOUS | Status: AC
  Filled 2019-03-31: qty 1

## 2019-03-31 MED ORDER — LABETALOL HCL 5 MG/ML IV SOLN
INTRAVENOUS | Status: DC | PRN
  Administered 2019-03-31: 5 mg via INTRAVENOUS
  Administered 2019-03-31: 2.5 mg via INTRAVENOUS
  Administered 2019-03-31: 5 mg via INTRAVENOUS

## 2019-03-31 MED ORDER — PHENYLEPHRINE 40 MCG/ML (10ML) SYRINGE FOR IV PUSH (FOR BLOOD PRESSURE SUPPORT)
PREFILLED_SYRINGE | INTRAVENOUS | Status: DC | PRN
  Administered 2019-03-31: 120 ug via INTRAVENOUS
  Administered 2019-03-31 (×2): 80 ug via INTRAVENOUS

## 2019-03-31 MED ORDER — GLYCOPYRROLATE PF 0.2 MG/ML IJ SOSY
PREFILLED_SYRINGE | INTRAMUSCULAR | Status: DC | PRN
  Administered 2019-03-31: .2 mg via INTRAVENOUS

## 2019-03-31 MED ORDER — SODIUM CHLORIDE (PF) 0.9 % IJ SOLN
INTRAMUSCULAR | Status: AC
  Filled 2019-03-31: qty 20

## 2019-03-31 MED ORDER — DEXAMETHASONE SODIUM PHOSPHATE 10 MG/ML IJ SOLN
INTRAMUSCULAR | Status: DC | PRN
  Administered 2019-03-31: 8 mg via INTRAVENOUS

## 2019-03-31 MED ORDER — SUCCINYLCHOLINE CHLORIDE 200 MG/10ML IV SOSY
PREFILLED_SYRINGE | INTRAVENOUS | Status: AC
  Filled 2019-03-31: qty 10

## 2019-03-31 MED ORDER — HYDROMORPHONE HCL 1 MG/ML IJ SOLN
INTRAMUSCULAR | Status: AC
  Filled 2019-03-31: qty 1

## 2019-03-31 MED ORDER — ONDANSETRON HCL 4 MG/2ML IJ SOLN
INTRAMUSCULAR | Status: AC
  Filled 2019-03-31: qty 2

## 2019-03-31 MED ORDER — SUFENTANIL CITRATE 50 MCG/ML IV SOLN
INTRAVENOUS | Status: DC | PRN
  Administered 2019-03-31: 10 ug via INTRAVENOUS
  Administered 2019-03-31: 5 ug via INTRAVENOUS
  Administered 2019-03-31 (×2): 10 ug via INTRAVENOUS
  Administered 2019-03-31: 5 ug via INTRAVENOUS
  Administered 2019-03-31: 10 ug via INTRAVENOUS
  Administered 2019-03-31 (×2): 15 ug via INTRAVENOUS
  Administered 2019-03-31: 5 ug via INTRAVENOUS

## 2019-03-31 MED ORDER — LIDOCAINE 2% (20 MG/ML) 5 ML SYRINGE
INTRAMUSCULAR | Status: DC | PRN
  Administered 2019-03-31: 1.5 mg/kg/h via INTRAVENOUS

## 2019-03-31 SURGICAL SUPPLY — 93 items
BLADE EXTENDED COATED 6.5IN (ELECTRODE) IMPLANT
CANNULA REDUC XI 12-8 STAPL (CANNULA) ×1
CANNULA REDUCER 12-8 DVNC XI (CANNULA) ×1 IMPLANT
CELLS DAT CNTRL 66122 CELL SVR (MISCELLANEOUS) IMPLANT
CLIP VESOLOCK LG 6/CT PURPLE (CLIP) IMPLANT
CLIP VESOLOCK MED 6/CT (CLIP) IMPLANT
COVER SURGICAL LIGHT HANDLE (MISCELLANEOUS) ×4 IMPLANT
COVER TIP SHEARS 8 DVNC (MISCELLANEOUS) ×1 IMPLANT
COVER TIP SHEARS 8MM DA VINCI (MISCELLANEOUS) ×1
COVER WAND RF STERILE (DRAPES) ×2 IMPLANT
DECANTER SPIKE VIAL GLASS SM (MISCELLANEOUS) IMPLANT
DERMABOND ADVANCED (GAUZE/BANDAGES/DRESSINGS) ×1
DERMABOND ADVANCED .7 DNX12 (GAUZE/BANDAGES/DRESSINGS) ×1 IMPLANT
DEVICE TROCAR PUNCTURE CLOSURE (ENDOMECHANICALS) ×2 IMPLANT
DRAIN CHANNEL 19F RND (DRAIN) IMPLANT
DRAPE ARM DVNC X/XI (DISPOSABLE) ×4 IMPLANT
DRAPE COLUMN DVNC XI (DISPOSABLE) ×1 IMPLANT
DRAPE DA VINCI XI ARM (DISPOSABLE) ×4
DRAPE DA VINCI XI COLUMN (DISPOSABLE) ×1
DRAPE SURG IRRIG POUCH 19X23 (DRAPES) ×2 IMPLANT
DRSG OPSITE POSTOP 4X10 (GAUZE/BANDAGES/DRESSINGS) IMPLANT
DRSG OPSITE POSTOP 4X6 (GAUZE/BANDAGES/DRESSINGS) ×2 IMPLANT
DRSG OPSITE POSTOP 4X8 (GAUZE/BANDAGES/DRESSINGS) IMPLANT
DRSG TELFA 3X8 NADH (GAUZE/BANDAGES/DRESSINGS) ×2 IMPLANT
ELECT PENCIL ROCKER SW 15FT (MISCELLANEOUS) ×2 IMPLANT
ELECT REM PT RETURN 15FT ADLT (MISCELLANEOUS) ×2 IMPLANT
ENDOLOOP SUT PDS II  0 18 (SUTURE)
ENDOLOOP SUT PDS II 0 18 (SUTURE) IMPLANT
EVACUATOR SILICONE 100CC (DRAIN) IMPLANT
GAUZE SPONGE 4X4 12PLY STRL (GAUZE/BANDAGES/DRESSINGS) ×2 IMPLANT
GLOVE BIO SURGEON STRL SZ 6.5 (GLOVE) ×6 IMPLANT
GLOVE BIOGEL PI IND STRL 7.0 (GLOVE) ×3 IMPLANT
GLOVE BIOGEL PI INDICATOR 7.0 (GLOVE) ×3
GOWN STRL REUS W/TWL 2XL LVL3 (GOWN DISPOSABLE) ×6 IMPLANT
GOWN STRL REUS W/TWL XL LVL3 (GOWN DISPOSABLE) ×8 IMPLANT
GRASPER ENDOPATH ANVIL 10MM (MISCELLANEOUS) IMPLANT
GRASPER SUT TROCAR 14GX15 (MISCELLANEOUS) IMPLANT
HOLDER FOLEY CATH W/STRAP (MISCELLANEOUS) ×2 IMPLANT
IRRIG SUCT STRYKERFLOW 2 WTIP (MISCELLANEOUS) ×2
IRRIGATION SUCT STRKRFLW 2 WTP (MISCELLANEOUS) ×1 IMPLANT
IRRIGATOR SUCT 8 DISP DVNC XI (IRRIGATION / IRRIGATOR) IMPLANT
IRRIGATOR SUCTION 8MM XI DISP (IRRIGATION / IRRIGATOR)
KIT PROCEDURE DA VINCI SI (MISCELLANEOUS) ×1
KIT PROCEDURE DVNC SI (MISCELLANEOUS) ×1 IMPLANT
KIT TURNOVER KIT A (KITS) ×2 IMPLANT
NEEDLE INSUFFLATION 14GA 120MM (NEEDLE) ×2 IMPLANT
PACK CARDIOVASCULAR III (CUSTOM PROCEDURE TRAY) ×2 IMPLANT
PACK COLON (CUSTOM PROCEDURE TRAY) ×2 IMPLANT
PORT LAP GEL ALEXIS MED 5-9CM (MISCELLANEOUS) ×2 IMPLANT
RTRCTR WOUND ALEXIS 18CM MED (MISCELLANEOUS)
SCISSORS LAP 5X35 DISP (ENDOMECHANICALS) ×2 IMPLANT
SEAL CANN UNIV 5-8 DVNC XI (MISCELLANEOUS) ×3 IMPLANT
SEAL XI 5MM-8MM UNIVERSAL (MISCELLANEOUS) ×3
SEALER VESSEL DA VINCI XI (MISCELLANEOUS) ×1
SEALER VESSEL EXT DVNC XI (MISCELLANEOUS) ×1 IMPLANT
SLEEVE ADV FIXATION 5X100MM (TROCAR) IMPLANT
SOLUTION ELECTROLUBE (MISCELLANEOUS) ×2 IMPLANT
STAPLER 45 BLU RELOAD XI (STAPLE) ×2 IMPLANT
STAPLER 45 BLUE RELOAD XI (STAPLE) ×2
STAPLER 45 GREEN RELOAD XI (STAPLE)
STAPLER 45 GRN RELOAD XI (STAPLE) IMPLANT
STAPLER CANNULA SEAL DVNC XI (STAPLE) ×1 IMPLANT
STAPLER CANNULA SEAL XI (STAPLE) ×1
STAPLER PROXIMATE 75MM BLUE (STAPLE) ×2 IMPLANT
STAPLER SHEATH (SHEATH) ×1
STAPLER SHEATH ENDOWRIST DVNC (SHEATH) ×1 IMPLANT
STAPLER VISISTAT 35W (STAPLE) IMPLANT
SUT ETHILON 2 0 PS N (SUTURE) IMPLANT
SUT NOVA NAB DX-16 0-1 5-0 T12 (SUTURE) ×6 IMPLANT
SUT PROLENE 2 0 KS (SUTURE) ×2 IMPLANT
SUT SILK 2 0 (SUTURE) ×1
SUT SILK 2 0 SH CR/8 (SUTURE) IMPLANT
SUT SILK 2-0 18XBRD TIE 12 (SUTURE) ×1 IMPLANT
SUT SILK 3 0 (SUTURE)
SUT SILK 3 0 SH CR/8 (SUTURE) ×2 IMPLANT
SUT SILK 3-0 18XBRD TIE 12 (SUTURE) IMPLANT
SUT V-LOC BARB 180 2/0GR6 GS22 (SUTURE)
SUT VIC AB 2-0 SH 18 (SUTURE) IMPLANT
SUT VIC AB 2-0 SH 27 (SUTURE) ×2
SUT VIC AB 2-0 SH 27X BRD (SUTURE) ×2 IMPLANT
SUT VIC AB 3-0 SH 18 (SUTURE) IMPLANT
SUT VIC AB 4-0 PS2 27 (SUTURE) ×4 IMPLANT
SUT VICRYL 0 UR6 27IN ABS (SUTURE) ×2 IMPLANT
SUTURE V-LC BRB 180 2/0GR6GS22 (SUTURE) IMPLANT
SYR 10ML ECCENTRIC (SYRINGE) ×2 IMPLANT
SYS LAPSCP GELPORT 120MM (MISCELLANEOUS)
SYSTEM LAPSCP GELPORT 120MM (MISCELLANEOUS) IMPLANT
TOWEL OR 17X26 10 PK STRL BLUE (TOWEL DISPOSABLE) IMPLANT
TOWEL OR NON WOVEN STRL DISP B (DISPOSABLE) ×2 IMPLANT
TRAY FOLEY MTR SLVR 16FR STAT (SET/KITS/TRAYS/PACK) ×2 IMPLANT
TROCAR ADV FIXATION 5X100MM (TROCAR) ×2 IMPLANT
TUBING CONNECTING 10 (TUBING) ×4 IMPLANT
TUBING INSUFFLATION 10FT LAP (TUBING) ×2 IMPLANT
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
## 2019-03-31 NOTE — Anesthesia Procedure Notes (Addendum)
Procedure Name: Intubation Date/Time: 03/31/2019 12:01 PM Performed by: Lissa Morales, CRNA Pre-anesthesia Checklist: Patient identified, Emergency Drugs available, Suction available and Patient being monitored Patient Re-evaluated:Patient Re-evaluated prior to induction Oxygen Delivery Method: Circle system utilized Preoxygenation: Pre-oxygenation with 100% oxygen Induction Type: IV induction Ventilation: Mask ventilation without difficulty Laryngoscope Size: Mac and 4 Grade View: Grade I Tube type: Oral Tube size: 7.5 mm Number of attempts: 2 (1st esophageal, lip fell in vision) Airway Equipment and Method: Stylet and Oral airway Placement Confirmation: ETT inserted through vocal cords under direct vision,  positive ETCO2 and breath sounds checked- equal and bilateral Secured at: 23 cm Tube secured with: Tape Dental Injury: Teeth and Oropharynx as per pre-operative assessment  Comments: Chip noted right front tooth as preop
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
## 2019-03-31 NOTE — Anesthesia Postprocedure Evaluation (Signed)
Anesthesia Post Note  Patient: Peter Golden  Procedure(s) Performed: ROBOTIC ASSISTED COLOSTOMY REVERSAL, SPLENIC FLEXURE TAKEDOWN (N/A Abdomen)     Patient location during evaluation: PACU Anesthesia Type: General Level of consciousness: awake and alert Pain management: pain level controlled Vital Signs Assessment: post-procedure vital signs reviewed and stable Respiratory status: spontaneous breathing, nonlabored ventilation and respiratory function stable Cardiovascular status: blood pressure returned to baseline and stable Postop Assessment: no apparent nausea or vomiting Anesthetic complications: no    Last Vitals:  Vitals:   03/31/19 1745 03/31/19 1811  BP: 137/84 (!) 142/95  Pulse: 88 94  Resp: 13 17  Temp:  37.3 C  SpO2: 100% 100%    Last Pain:  Vitals:   03/31/19 1816  TempSrc:   PainSc: [place]

## 2019-03-31 NOTE — Transfer of Care (Signed)
Immediate Anesthesia Transfer of Care Note  Patient: Peter Golden  Procedure(s) Performed: ROBOTIC ASSISTED COLOSTOMY REVERSAL, SPLENIC FLEXURE TAKEDOWN (N/A Abdomen)  Patient Location: PACU  Anesthesia Type:General  Level of Consciousness: awake, alert , oriented and patient cooperative  Airway & Oxygen Therapy: Patient Spontanous Breathing and Patient connected to face mask oxygen  Post-op Assessment: Report given to RN, Post -op Vital signs reviewed and stable and Patient moving all extremities X 4  Post vital signs: stable  Last Vitals:  Vitals Value Taken Time  BP 153/106 03/31/19 1705  Temp    Pulse 105 03/31/19 1659  Resp 20 03/31/19 1705  SpO2 94 % 03/31/19 1659  Vitals shown include unvalidated device data.  Last Pain:  Vitals:   03/31/19 1011  TempSrc: Oral         Complications: No apparent anesthesia complications
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
## 2019-04-01 ENCOUNTER — Encounter (HOSPITAL_COMMUNITY): Payer: Self-pay | Admitting: General Surgery

## 2019-04-01 LAB — BASIC METABOLIC PANEL
Anion gap: 9 (ref 5–15)
BUN: 8 mg/dL (ref 6–20)
CO2: 26 mmol/L (ref 22–32)
Calcium: 8.8 mg/dL — ABNORMAL LOW (ref 8.9–10.3)
Chloride: 101 mmol/L (ref 98–111)
Creatinine, Ser: 1.17 mg/dL (ref 0.61–1.24)
GFR calc Af Amer: >60 mL/min (ref >60)
GFR calc non Af Amer: >60 mL/min (ref >60)
Glucose, Bld: 153 mg/dL — ABNORMAL HIGH (ref 70–99)
Potassium: 4.4 mmol/L (ref 3.5–5.1)
Sodium: 136 mmol/L (ref 135–145)

## 2019-04-01 LAB — CBC
HCT: 42.7 % (ref 39.0–52.0)
Hemoglobin: 14.1 g/dL (ref 13.0–17.0)
MCH: 31.8 pg (ref 26.0–34.0)
MCHC: 33 g/dL (ref 30.0–36.0)
MCV: 96.4 fL (ref 80.0–100.0)
Platelets: 192 10*3/uL (ref 150–400)
RBC: 4.43 MIL/uL (ref 4.22–5.81)
RDW: 14.8 % (ref 11.5–15.5)
WBC: 10.8 10*3/uL — ABNORMAL HIGH (ref 4.0–10.5)
nRBC: 0 % (ref 0.0–0.2)

## 2019-04-01 MED ORDER — HYDROMORPHONE HCL 1 MG/ML IJ SOLN
1.0000 mg | INTRAMUSCULAR | Status: DC | PRN
  Administered 2019-04-01 – 2019-04-02 (×3): 1 mg via INTRAVENOUS
  Filled 2019-04-01 (×3): qty 1

## 2019-04-01 MED ORDER — ACETAMINOPHEN 500 MG PO TABS
1000.0000 mg | ORAL_TABLET | Freq: Four times a day (QID) | ORAL | Status: DC
  Administered 2019-04-01 – 2019-04-05 (×19): 1000 mg via ORAL
  Filled 2019-04-01 (×20): qty 2

## 2019-04-01 MED ORDER — METHOCARBAMOL 500 MG PO TABS
750.0000 mg | ORAL_TABLET | Freq: Four times a day (QID) | ORAL | Status: DC
  Administered 2019-04-01 – 2019-04-06 (×20): 750 mg via ORAL
  Filled 2019-04-01 (×20): qty 2

## 2019-04-01 MED ORDER — OXYCODONE HCL 5 MG PO TABS
10.0000 mg | ORAL_TABLET | ORAL | Status: DC | PRN
  Administered 2019-04-02: 10 mg via ORAL
  Filled 2019-04-01: qty 2

## 2019-04-01 MED ORDER — KCL IN DEXTROSE-NACL 20-5-0.9 MEQ/L-%-% IV SOLN
INTRAVENOUS | Status: DC
  Administered 2019-04-01 – 2019-04-03 (×4): via INTRAVENOUS
  Filled 2019-04-01 (×5): qty 1000

## 2019-04-01 MED ORDER — CHLORHEXIDINE GLUCONATE CLOTH 2 % EX PADS
6.0000 | MEDICATED_PAD | Freq: Every day | CUTANEOUS | Status: DC
  Administered 2019-04-01: 6 via TOPICAL

## 2019-04-01 NOTE — Progress Notes (Addendum)
   04/01/19 2126  Vitals  Temp (!) 100.8 F (38.2 C)  Temp Source Oral  BP (!) 147/88  MAP (mmHg) 105  BP Location Right Arm  BP Method Automatic  Patient Position (if appropriate) Lying  Pulse Rate (!) 117  Pulse Rate Source Monitor  Resp 18  MEW protocol started due to patient's yellow MEWs score, pt given tylenol for fever and MD on call notified. Pt is stable and will continued to be monitored, Dawson Bills, RN
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
## 2019-04-01 NOTE — Progress Notes (Signed)
Patient with severe pain and elevated BP. Tried to call [facility] office X2 to report-message that call could not be completed. Call made to Will Creig Hines who contacted Dr Marcello Moores. Orders noted. Donne Hazel, RN

## 2019-04-02 LAB — CBC
HCT: 44.6 % (ref 39.0–52.0)
Hemoglobin: 14.5 g/dL (ref 13.0–17.0)
MCH: 31.8 pg (ref 26.0–34.0)
MCHC: 32.5 g/dL (ref 30.0–36.0)
MCV: 97.8 fL (ref 80.0–100.0)
Platelets: 160 10*3/uL (ref 150–400)
RBC: 4.56 MIL/uL (ref 4.22–5.81)
RDW: 15.2 % (ref 11.5–15.5)
WBC: 8.9 10*3/uL (ref 4.0–10.5)
nRBC: 0 % (ref 0.0–0.2)

## 2019-04-02 LAB — BASIC METABOLIC PANEL
Anion gap: 12 (ref 5–15)
BUN: 8 mg/dL (ref 6–20)
CO2: 26 mmol/L (ref 22–32)
Calcium: 9.1 mg/dL (ref 8.9–10.3)
Chloride: 99 mmol/L (ref 98–111)
Creatinine, Ser: 1.17 mg/dL (ref 0.61–1.24)
GFR calc Af Amer: >60 mL/min (ref >60)
GFR calc non Af Amer: >60 mL/min (ref >60)
Glucose, Bld: 116 mg/dL — ABNORMAL HIGH (ref 70–99)
Potassium: 3.7 mmol/L (ref 3.5–5.1)
Sodium: 137 mmol/L (ref 135–145)

## 2019-04-02 LAB — SURGICAL PATHOLOGY

## 2019-04-02 MED ORDER — METOPROLOL TARTRATE 5 MG/5ML IV SOLN
5.0000 mg | Freq: Four times a day (QID) | INTRAVENOUS | Status: DC | PRN
  Administered 2019-04-02 – 2019-04-03 (×2): 5 mg via INTRAVENOUS
  Filled 2019-04-02 (×2): qty 5

## 2019-04-02 NOTE — Progress Notes (Signed)
   04/02/19 1300  Clinical Encounter Type  Visited With Patient  Visit Type Initial;Psychological support;Spiritual support  Referral From Nurse  Consult/Referral To Chaplain  Spiritual Encounters  Spiritual Needs Prayer;Emotional;Other (Comment) (Spiritual Care Conversation/Support)  Stress Factors  Patient Stress Factors Health changes   I visited with Mr. Desa per spiritual care consult for prayer. With the aid of the translation device I provided him with spiritual care, including prayer. Mr. Bedel stated that he is of the [employer] [place].  He requested prayer for healing.   Please, contact Spiritual Care for further assistance.   Chaplain Shanon Ace M.Div., [facility]
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
## 2019-04-02 NOTE — Progress Notes (Signed)
Patient's MEWs is currently yellow again. Patient was yellow earlier and MD was notified and protocol was started. MD aware of elevated temp, tylenol has been administered, IS is being used and ice pack applied to incision. Patient is currently being monitored, vitals will be taken again per protocol, Dawson Bills, RN

## 2019-04-02 NOTE — Progress Notes (Addendum)
Patient had yellow MEWs due to temp and pulse, MD on call notified and order to restart fluids requested. Patient's O2 sat was in the 80s, 2 liters of O2 per nasal cannula applied to patient. O2 sat is now currently 100%. MD called back and verbal order for fluids given.  MD notified that patient has had little output during the day and fluids have been started at 177ml/hr. Patient has voided and patient's MEWs score is now green. Patient is stable and will continue to be monitored. Dawson Bills, RN

## 2019-04-03 LAB — BASIC METABOLIC PANEL
Anion gap: 7 (ref 5–15)
BUN: 6 mg/dL (ref 6–20)
CO2: 26 mmol/L (ref 22–32)
Calcium: 8.6 mg/dL — ABNORMAL LOW (ref 8.9–10.3)
Chloride: 105 mmol/L (ref 98–111)
Creatinine, Ser: 0.92 mg/dL (ref 0.61–1.24)
GFR calc Af Amer: >60 mL/min (ref >60)
GFR calc non Af Amer: >60 mL/min (ref >60)
Glucose, Bld: 115 mg/dL — ABNORMAL HIGH (ref 70–99)
Potassium: 3.7 mmol/L (ref 3.5–5.1)
Sodium: 138 mmol/L (ref 135–145)

## 2019-04-03 LAB — CBC
HCT: 39.6 % (ref 39.0–52.0)
Hemoglobin: 13.4 g/dL (ref 13.0–17.0)
MCH: 32.3 pg (ref 26.0–34.0)
MCHC: 33.8 g/dL (ref 30.0–36.0)
MCV: 95.4 fL (ref 80.0–100.0)
Platelets: 152 10*3/uL (ref 150–400)
RBC: 4.15 MIL/uL — ABNORMAL LOW (ref 4.22–5.81)
RDW: 14.8 % (ref 11.5–15.5)
WBC: 7.5 10*3/uL (ref 4.0–10.5)
nRBC: 0 % (ref 0.0–0.2)

## 2019-04-03 MED ORDER — IBUPROFEN 200 MG PO TABS
200.0000 mg | ORAL_TABLET | Freq: Four times a day (QID) | ORAL | Status: DC | PRN
  Administered 2019-04-03: 200 mg via ORAL
  Filled 2019-04-03: qty 1

## 2019-04-03 NOTE — Progress Notes (Signed)
Pt temp was 102.9 and pulse was 111 at 1730 and then 101.9 with pulse rate of 109 at 1800.  Pt stated he was cold. Dr. Kieth Brightly paged about about above information and told nurse to just monitor pt for now.  Will continue to monitor pt.

## 2019-04-03 NOTE — Progress Notes (Signed)
Pt temp 102.3 at 2138. Scheduled tylenol given. Temp 101.8 at 2332. Pt has chills.  Dr. Kieth Brightly paged. Ordered 200 mg ibuprofen q6 PRN for fevers. Told RN to continue monitoring. Will continue to monitor.
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
## 2019-04-04 ENCOUNTER — Inpatient Hospital Stay (HOSPITAL_COMMUNITY): Payer: Worker's Compensation

## 2019-04-04 ENCOUNTER — Inpatient Hospital Stay (HOSPITAL_COMMUNITY): Payer: Self-pay

## 2019-04-04 LAB — BASIC METABOLIC PANEL
Anion gap: 11 (ref 5–15)
BUN: 8 mg/dL (ref 6–20)
CO2: 22 mmol/L (ref 22–32)
Calcium: 8.8 mg/dL — ABNORMAL LOW (ref 8.9–10.3)
Chloride: 103 mmol/L (ref 98–111)
Creatinine, Ser: 0.92 mg/dL (ref 0.61–1.24)
GFR calc Af Amer: >60 mL/min (ref >60)
GFR calc non Af Amer: >60 mL/min (ref >60)
Glucose, Bld: 98 mg/dL (ref 70–99)
Potassium: 3.4 mmol/L — ABNORMAL LOW (ref 3.5–5.1)
Sodium: 136 mmol/L (ref 135–145)

## 2019-04-04 LAB — CBC WITH DIFFERENTIAL/PLATELET
Abs Immature Granulocytes: 0.01 10*3/uL (ref 0.00–0.07)
Basophils Absolute: 0 10*3/uL (ref 0.0–0.1)
Basophils Relative: 0 %
Eosinophils Absolute: 0 10*3/uL (ref 0.0–0.5)
Eosinophils Relative: 0 %
HCT: 38.9 % — ABNORMAL LOW (ref 39.0–52.0)
Hemoglobin: 12.9 g/dL — ABNORMAL LOW (ref 13.0–17.0)
Immature Granulocytes: 0 %
Lymphocytes Relative: 33 %
Lymphs Abs: 2.2 10*3/uL (ref 0.7–4.0)
MCH: 31.7 pg (ref 26.0–34.0)
MCHC: 33.2 g/dL (ref 30.0–36.0)
MCV: 95.6 fL (ref 80.0–100.0)
Monocytes Absolute: 0.4 10*3/uL (ref 0.1–1.0)
Monocytes Relative: 5 %
Neutro Abs: 4.1 10*3/uL (ref 1.7–7.7)
Neutrophils Relative %: 62 %
Platelets: 199 10*3/uL (ref 150–400)
RBC: 4.07 MIL/uL — ABNORMAL LOW (ref 4.22–5.81)
RDW: 15 % (ref 11.5–15.5)
WBC: 6.7 10*3/uL (ref 4.0–10.5)
nRBC: 0 % (ref 0.0–0.2)

## 2019-04-04 MED ORDER — IBUPROFEN 200 MG PO TABS
600.0000 mg | ORAL_TABLET | Freq: Four times a day (QID) | ORAL | Status: DC | PRN
  Administered 2019-04-04: 15:00:00 600 mg via ORAL
  Filled 2019-04-04: qty 3

## 2019-04-04 NOTE — Progress Notes (Signed)
Pt temp 103.1 and plus 129. Pt noted having chills.  On call MD, Dr. Barry Dienes notified of above vitals.  New orders for blood cultures, urinalysis, chest x ray and ibuprofen given and carried out.  Pt not complaining of pain at this time; up ambulating in hallway and sitting up in chair today. Will continue to monitor pt.
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
## 2019-04-05 MED ORDER — LEVOFLOXACIN 750 MG PO TABS
750.0000 mg | ORAL_TABLET | Freq: Every day | ORAL | Status: DC
  Administered 2019-04-05 – 2019-04-06 (×2): 750 mg via ORAL
  Filled 2019-04-05 (×2): qty 1
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
## 2019-04-05 NOTE — Progress Notes (Signed)
Pt educated about the importance of using the urinal but pt continues to refuse.

## 2019-04-06 MED ORDER — LEVOFLOXACIN 750 MG PO TABS: 750.0000 mg | ORAL_TABLET | Freq: Every day | ORAL | 0 refills | Status: AC

## 2019-04-06 MED ORDER — OXYCODONE HCL 10 MG PO TABS: 10.0000 mg | ORAL_TABLET | Freq: Three times a day (TID) | ORAL | 0 refills | Status: DC | PRN

## 2019-04-06 NOTE — Discharge Summary (Signed)
Physician Discharge Summary  Patient ID: Peter Golden MRN: [id number] DOB/AGE: Apr 27, 1981 38 y.o.  Admit date: 03/31/2019 Discharge date: 04/06/2019  Admission Diagnoses: colosotmy  Discharge Diagnoses:  Active Problems:   Colostomy status ([place]) Community acquired pneumonia  Discharged Condition: good  Hospital Course: 38 year old male who underwent robotic colostomy reversal.  His Foley was removed on postop day 1.  His diet was advanced as tolerated.  He developed some hypoxia and fevers and a chest x-ray was ordered.  Left lower lobe pneumonia which was treated with Levaquin.  Thank you the following day the patient felt much better and was ready for discharge.  He will continue his antibiotic course at home.  Consults: None  Significant Diagnostic Studies: labs: cbc, bmet  Treatments: IV hydration, antibiotics: Levaquin and surgery: robotic assisted colostomy reversal  Discharge Exam: Blood pressure (!) 142/87, pulse (!) 107, temperature 99.6 F (37.6 C), temperature source Oral, resp. rate 18, height 5\' 7"  (1.702 m), weight 80 kg, SpO2 96 %. General appearance: alert and cooperative GI: normal findings: soft, non-tender Incision/Wound: clean, dry, intact  Disposition: home   Allergies as of 04/06/2019   No Known Allergies     Medication List    TAKE these medications   benztropine 1 MG tablet Commonly known as: COGENTIN Take 1 tablet (1 mg total) by mouth 2 (two) times daily.   Closed-End Colostomy Pouch Misc Use as directed   docusate sodium 100 MG capsule Commonly known as: COLACE Take 1 capsule (100 mg total) by mouth 2 (two) times daily as needed for mild constipation.   gabapentin 300 MG capsule Commonly known as: NEURONTIN Take 1 capsule (300 mg total) by mouth 3 (three) times daily. What changed:   when to take this  reasons to take this   haloperidol 1 MG tablet Commonly known as: HALDOL Take 1 tablet (1 mg total) by mouth 3 (three)  times daily.   haloperidol decanoate 100 MG/ML injection Commonly known as: HALDOL DECANOATE Inject 1 mL (100 mg total) into the muscle every 30 (thirty) days. Next due 4/3   levETIRAcetam 500 MG tablet Commonly known as: Keppra Take 1 tablet (500 mg total) by mouth 2 (two) times daily for 30 days.   levofloxacin 750 MG tablet Commonly known as: LEVAQUIN Take 1 tablet (750 mg total) by mouth daily for 5 days.   mirtazapine 15 MG tablet Commonly known as: REMERON Take 1 tablet (15 mg total) by mouth at bedtime. For depression/sleep   Oxycodone HCl 10 MG Tabs Take 1 tablet (10 mg total) by mouth every 8 (eight) hours as needed for moderate pain or severe pain.      Follow-up Information    Leighton Ruff, MD Follow up.   Specialty: General Surgery Contact information: [address] [place] [address] [phone]           Signed: Rosario Adie 85/01/8501, 8:00 AM

## 2019-04-06 NOTE — Progress Notes (Signed)
Refused am tylenol, says he's taking too much medication. Patient was encouraged to use IS and ambulate every  four hours, pt was also instructed to use urinal so nurses can measure urine output.

## 2019-04-06 NOTE — Discharge Instructions (Signed)
ABDOMINAL SURGERY: POST OP INSTRUCTIONS  1. DIET: Follow a light bland diet the first 24 hours after arrival home, such as soup, liquids, crackers, etc.  Be sure to include lots of fluids daily.  Avoid fast food or heavy meals as your are more likely to get nauseated.  Do not eat any uncooked fruits or vegetables for the next 2 weeks as your colon heals. 2. Take your usually prescribed home medications unless otherwise directed. 3. PAIN CONTROL: a. Pain is best controlled by a usual combination of three different methods TOGETHER: i. Ice/Heat ii. Over the counter pain medication iii. Prescription pain medication b. Most patients will experience some swelling and bruising around the incisions.  Ice packs or heating pads (30-60 minutes up to 6 times a day) will help. Use ice for the first few days to help decrease swelling and bruising, then switch to heat to help relax tight/sore spots and speed recovery.  Some people prefer to use ice alone, heat alone, alternating between ice & heat.  Experiment to what works for you.  Swelling and bruising can take several weeks to resolve.   c. It is helpful to take an over-the-counter pain medication regularly for the first few weeks.  Choose one of the following that works best for you: i. Naproxen (Aleve, etc)  Two 220mg  tabs twice a day ii. Ibuprofen (Advil, etc) Three 200mg  tabs four times a day (every meal & bedtime) iii. Acetaminophen (Tylenol, etc) 500-650mg  four times a day (every meal & bedtime) d. A  prescription for pain medication (such as oxycodone, hydrocodone, etc) should be given to you upon discharge.  Take your pain medication as prescribed.  i. If you are having problems/concerns with the prescription medicine (does not control pain, nausea, vomiting, rash, itching, etc), please call [phone] to see if we need to switch you to a different pain medicine that will work better for you and/or control your side effect better. ii. If you  need a refill on your pain medication, please contact your pharmacy.  They will contact our office to request authorization. Prescriptions will not be filled after 5 pm or on week-ends. 4. Avoid getting constipated.  Between the surgery and the pain medications, it is common to experience some constipation.  Increasing fluid intake and taking a fiber supplement (such as Metamucil, Citrucel, FiberCon, MiraLax, etc) 1-2 times a day regularly will usually help prevent this problem from occurring.  A mild laxative (prune juice, Milk of Magnesia, MiraLax, etc) should be taken according to package directions if there are no bowel movements after 48 hours.   5. Watch out for diarrhea.  If you have many loose bowel movements, simplify your diet to bland foods & liquids for a few days.  Stop any stool softeners and decrease your fiber supplement.  Switching to mild anti-diarrheal medications (Kayopectate, Pepto Bismol) can help.  If this worsens or does not improve, please call us. 6. Wash / shower every day.  You may shower over the incision / wound.  Avoid baths until the skin is fully healed.  Continue to shower over incision(s) after the dressing is off. 7. Cover with a dry dressing or bandaid and change daily.  Your wound does not need to be packed inside. 8. ACTIVITIES as tolerated:   a. You may resume regular (light) daily activities beginning the next day--such as daily self-care, walking, climbing stairs--gradually increasing activities as tolerated.  If you can walk 30 minutes without difficulty, it is  safe to try more intense activity such as jogging, treadmill, bicycling, low-impact aerobics, swimming, etc. b. Save the most intensive and strenuous activity for last such as sit-ups, heavy lifting, contact sports, etc  Refrain from any heavy lifting or straining until you are off narcotics for pain control.   c. DO NOT PUSH THROUGH PAIN.  Let pain be your guide: If it hurts to do something, don't do it.   Pain is your body warning you to avoid that activity for another week until the pain goes down. d. You may drive when you are no longer taking prescription pain medication, you can comfortably wear a seatbelt, and you can safely maneuver your car and apply brakes. e. Dennis Bast may have sexual intercourse when it is comfortable.  9. FOLLOW UP in our office a. Please call [facility] at [phone] to set up an appointment to see your surgeon in the office for a follow-up appointment approximately 1-2 weeks after your surgery. b. Make sure that you call for this appointment the day you arrive home to insure a convenient appointment time. 10. IF YOU HAVE DISABILITY OR FAMILY LEAVE FORMS, BRING THEM TO THE OFFICE FOR PROCESSING.  DO NOT GIVE THEM TO YOUR DOCTOR.   WHEN TO CALL us [phone]: 1. Poor pain control 2. Reactions / problems with new medications (rash/itching, nausea, etc)  3. Fever over 101.5 F (38.5 C) 4. Inability to urinate 5. Nausea and/or vomiting 6. Worsening swelling or bruising 7. Continued bleeding from incision. 8. Increased pain, redness, or drainage from the incision  The clinic staff is available to answer your questions during regular business hours (8:30am-5pm).  Please dont hesitate to call and ask to speak to one of our nurses for clinical concerns.   A surgeon from [facility] is always on call at the hospitals   If you have a medical emergency, go to the nearest emergency room or call 911.    [facility], [address], [place], [place], [place]  [address] ? MAIN: [phone] ? TOLL FREE: [phone] ? FAX [phone] [email or web address]

## 2019-04-06 NOTE — Progress Notes (Signed)
Discharge instructions given to pt and all questions were answered.  

## 2019-04-09 LAB — CULTURE, BLOOD (ROUTINE X 2)
Culture: NO GROWTH
Culture: NO GROWTH
Special Requests: ADEQUATE
Special Requests: ADEQUATE

## 2019-11-13 IMAGING — DX DG CHEST 2V
2 series · 2 of 2 positions shown · non-contrast
Comparison: Chest x-ray 08/29/2018.

CLINICAL DATA: 38-year-old male with history of fever to 103
degrees.

EXAM:
CHEST - 2 VIEW

[chest pa]
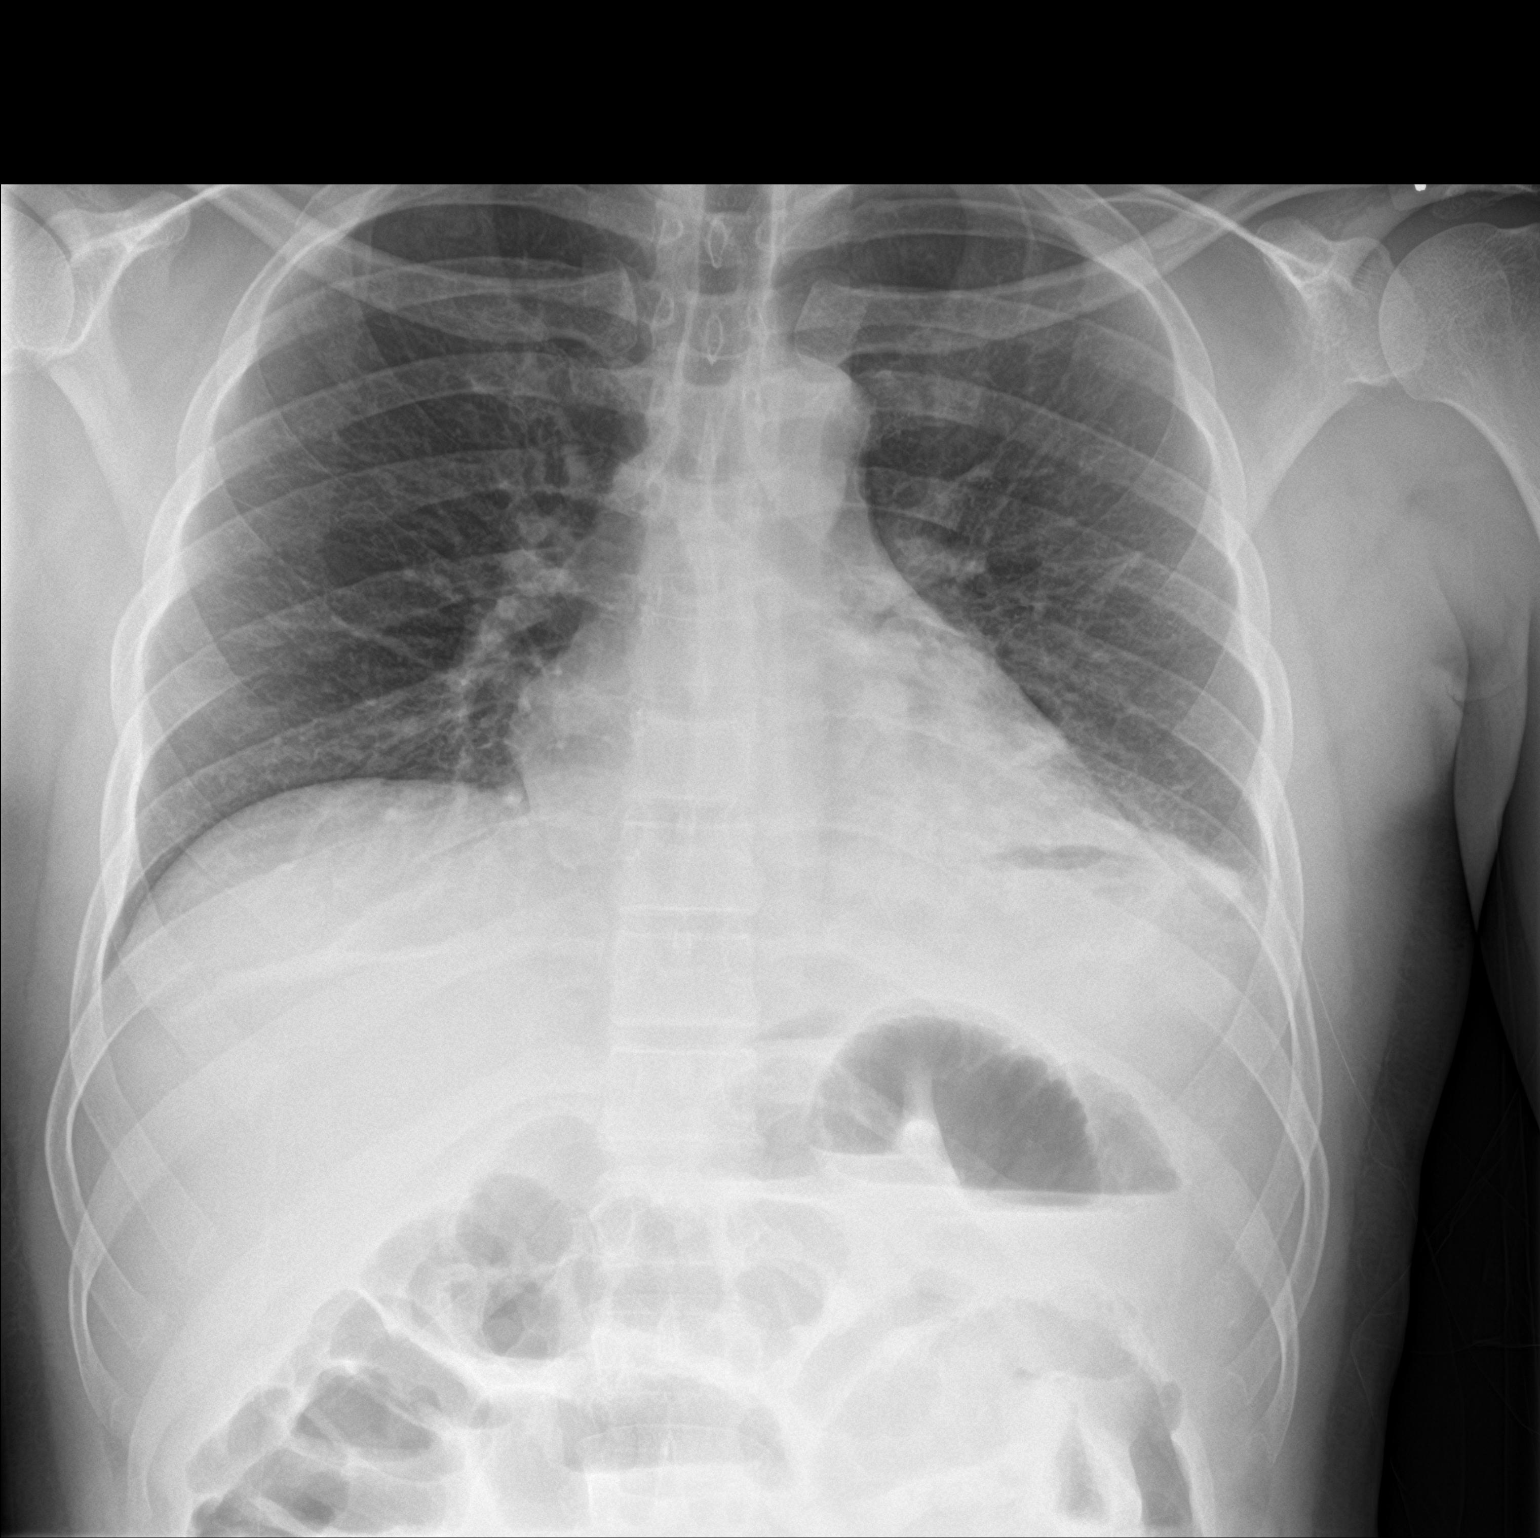

[chest lat]
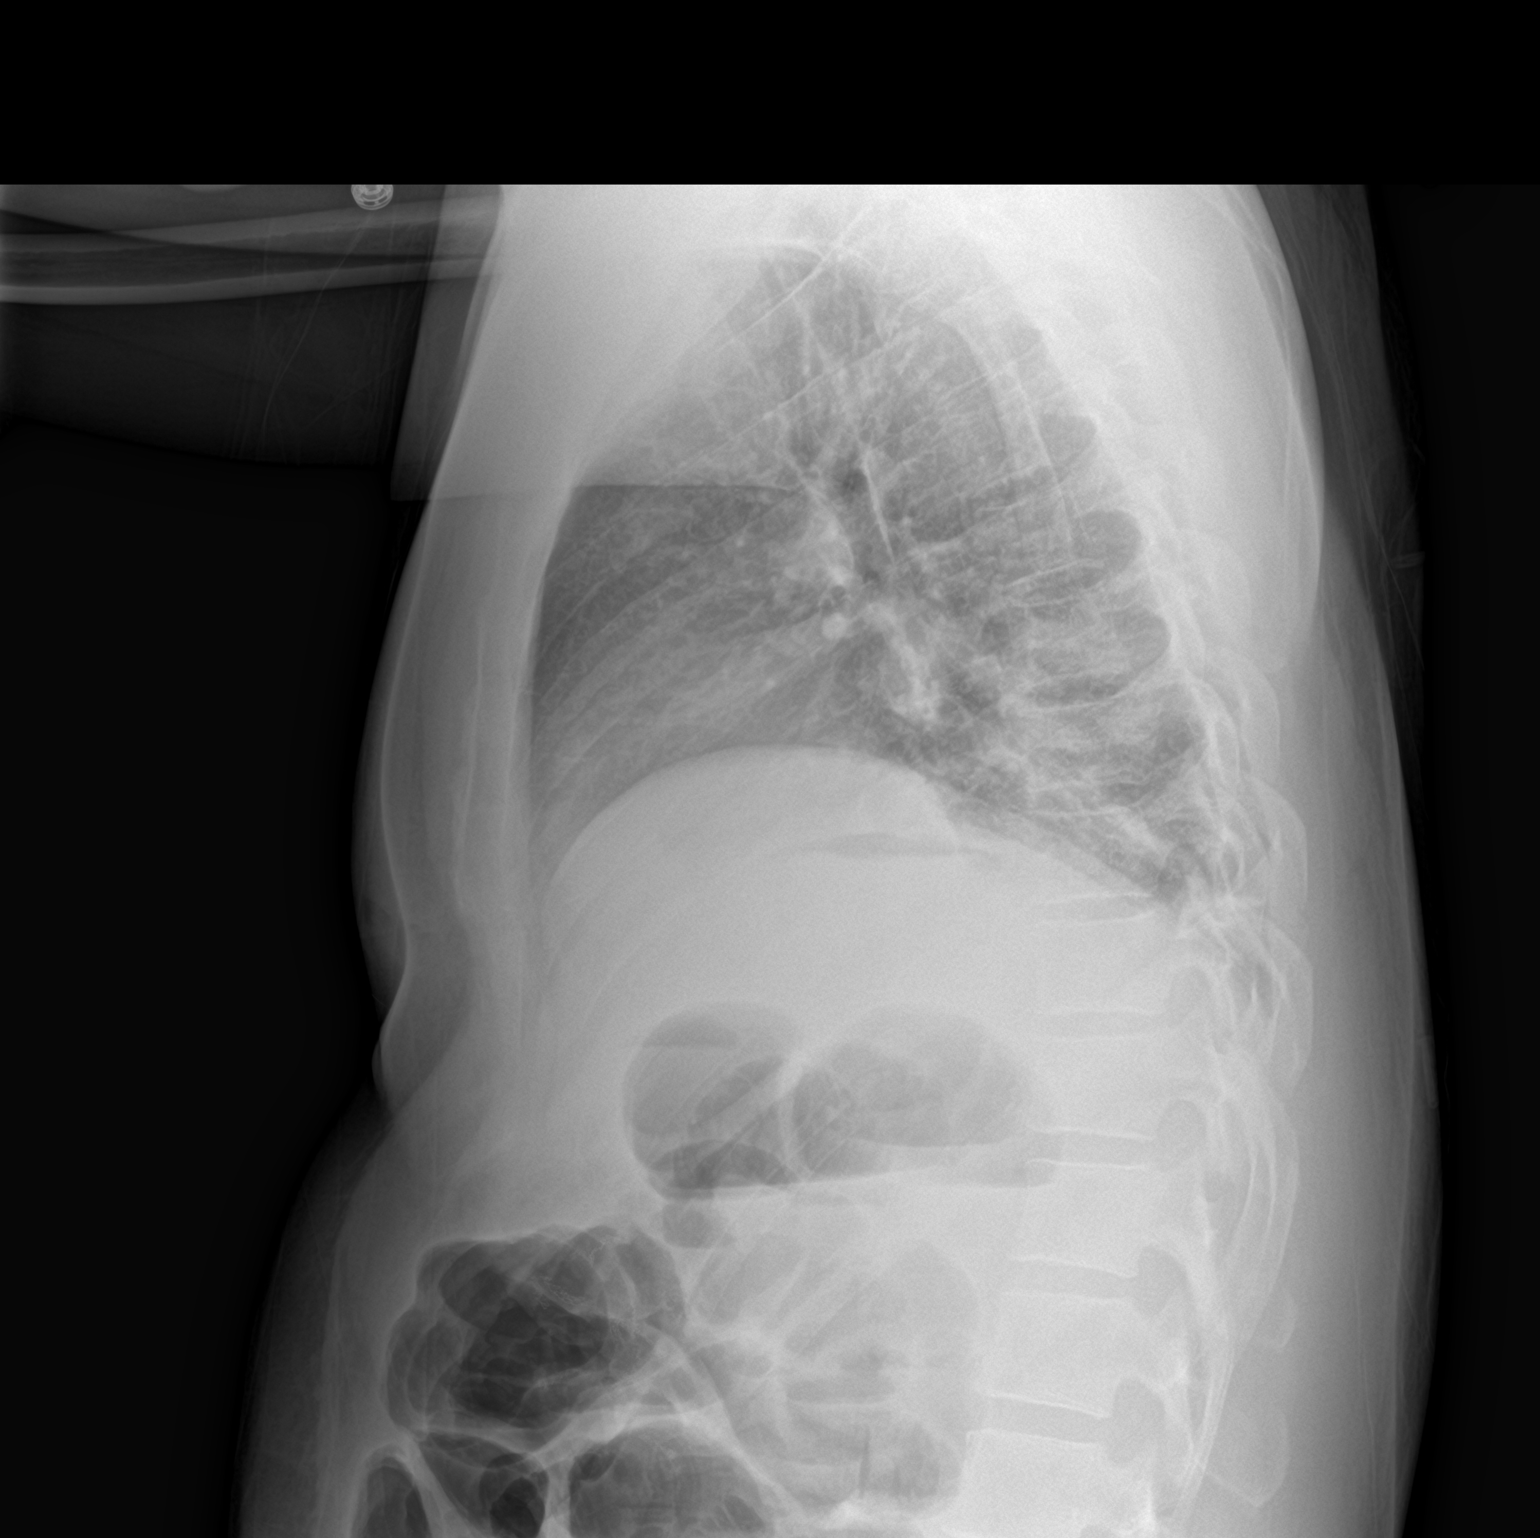

[2 of 2 positions shown; findings below may reference images not displayed]

FINDINGS: Left lower lobe airspace consolidation. Trace left pleural fluid.
Right lung is clear. No right pleural effusion. No evidence of
pulmonary edema. Heart size is normal. Upper mediastinal contours
are within normal limits. In the upper abdomen there are some
dilated loops of small bowel and air-fluid levels.
IMPRESSION: 1. Left lower lobe pneumonia with trace parapneumonic pleural
effusion.
2. Unusual bowel gas visualized in the upper abdomen which could be
seen in the setting of partial small bowel obstruction or ileus.
Further evaluation with dedicated abdominal radiographs is suggested
if clinically appropriate.

## 2019-12-27 ENCOUNTER — Ambulatory Visit (HOSPITAL_COMMUNITY): Payer: Self-pay | Admitting: Psychiatry

## 2019-12-27 ENCOUNTER — Ambulatory Visit (HOSPITAL_COMMUNITY): Payer: Self-pay | Admitting: Licensed Clinical Social Worker

## 2020-01-06 ENCOUNTER — Ambulatory Visit (HOSPITAL_COMMUNITY): Payer: Self-pay | Admitting: Licensed Clinical Social Worker

## 2020-01-10 ENCOUNTER — Ambulatory Visit (HOSPITAL_COMMUNITY)
Admission: AD | Admit: 2020-01-10 | Discharge: 2020-01-11 | Disposition: A | Discharge status: Other Acute Inpatient Hospital | Payer: No Payment, Other | Attending: Nurse Practitioner | Admitting: Nurse Practitioner

## 2020-01-10 ENCOUNTER — Other Ambulatory Visit: Payer: Self-pay

## 2020-01-10 DIAGNOSIS — Z1330 Encounter for screening examination for mental health and behavioral disorders, unspecified: Secondary | ICD-10-CM | POA: Insufficient documentation

## 2020-01-10 DIAGNOSIS — R4689 Other symptoms and signs involving appearance and behavior: Secondary | ICD-10-CM

## 2020-01-10 MED ORDER — LORAZEPAM 2 MG/ML IJ SOLN
INTRAMUSCULAR | Status: AC
  Administered 2020-01-10: 2 mg via INTRAMUSCULAR
  Filled 2020-01-10: qty 1

## 2020-01-10 MED ORDER — DIPHENHYDRAMINE HCL 50 MG/ML IJ SOLN
INTRAMUSCULAR | Status: AC
  Administered 2020-01-10: 50 mg via INTRAMUSCULAR
  Filled 2020-01-10: qty 1

## 2020-01-10 MED ORDER — LORAZEPAM 2 MG/ML IJ SOLN: 2.0000 mg | Freq: Once | INTRAMUSCULAR | Status: AC

## 2020-01-10 MED ORDER — ZIPRASIDONE MESYLATE 20 MG IM SOLR: 20.0000 mg | Freq: Once | INTRAMUSCULAR | Status: AC

## 2020-01-10 MED ORDER — ZIPRASIDONE MESYLATE 20 MG IM SOLR
INTRAMUSCULAR | Status: AC
  Administered 2020-01-10: 20 mg via INTRAMUSCULAR
  Filled 2020-01-10: qty 20

## 2020-01-10 MED ORDER — DIPHENHYDRAMINE HCL 50 MG/ML IJ SOLN: 50.0000 mg | Freq: Once | INTRAMUSCULAR | Status: AC

## 2020-01-10 NOTE — ED Notes (Signed)
Pt in assessment area. Speaking other language, screaming, hitting things, pacing, removed ID bracelet. Cooperative with RNs for IM med administration then continued previous behaviors. Security with patient for safety.

## 2020-01-11 ENCOUNTER — Emergency Department (HOSPITAL_COMMUNITY)
Admission: EM | Admit: 2020-01-11 | Discharge: 2020-01-11 | Disposition: A | Discharge status: Other Acute Inpatient Hospital | Payer: Self-pay | Attending: Emergency Medicine | Admitting: Emergency Medicine

## 2020-01-11 ENCOUNTER — Other Ambulatory Visit: Payer: Self-pay

## 2020-01-11 ENCOUNTER — Encounter (HOSPITAL_COMMUNITY): Payer: Self-pay | Admitting: Registered Nurse

## 2020-01-11 ENCOUNTER — Ambulatory Visit (HOSPITAL_COMMUNITY)
Admission: EM | Admit: 2020-01-11 | Discharge: 2020-01-12 | Disposition: A | Discharge status: Home / Self Care | Payer: No Payment, Other | Attending: Registered Nurse | Admitting: Registered Nurse

## 2020-01-11 DIAGNOSIS — F200 Paranoid schizophrenia: Secondary | ICD-10-CM | POA: Insufficient documentation

## 2020-01-11 DIAGNOSIS — F209 Schizophrenia, unspecified: Secondary | ICD-10-CM | POA: Diagnosis not present

## 2020-01-11 DIAGNOSIS — F419 Anxiety disorder, unspecified: Secondary | ICD-10-CM | POA: Diagnosis not present

## 2020-01-11 DIAGNOSIS — R454 Irritability and anger: Secondary | ICD-10-CM | POA: Diagnosis not present

## 2020-01-11 DIAGNOSIS — F4323 Adjustment disorder with mixed anxiety and depressed mood: Secondary | ICD-10-CM

## 2020-01-11 DIAGNOSIS — R451 Restlessness and agitation: Secondary | ICD-10-CM | POA: Diagnosis not present

## 2020-01-11 DIAGNOSIS — F29 Unspecified psychosis not due to a substance or known physiological condition: Secondary | ICD-10-CM | POA: Insufficient documentation

## 2020-01-11 DIAGNOSIS — E860 Dehydration: Secondary | ICD-10-CM

## 2020-01-11 DIAGNOSIS — F411 Generalized anxiety disorder: Secondary | ICD-10-CM

## 2020-01-11 DIAGNOSIS — Z20822 Contact with and (suspected) exposure to covid-19: Secondary | ICD-10-CM | POA: Insufficient documentation

## 2020-01-11 LAB — COMPREHENSIVE METABOLIC PANEL
ALT: 16 U/L (ref 0–44)
AST: 34 U/L (ref 15–41)
Albumin: 5.7 g/dL — ABNORMAL HIGH (ref 3.5–5.0)
Alkaline Phosphatase: 58 U/L (ref 38–126)
Anion gap: 19 — ABNORMAL HIGH (ref 5–15)
BUN: 20 mg/dL (ref 6–20)
CO2: 21 mmol/L — ABNORMAL LOW (ref 22–32)
Calcium: 10.6 mg/dL — ABNORMAL HIGH (ref 8.9–10.3)
Chloride: 103 mmol/L (ref 98–111)
Creatinine, Ser: 1.65 mg/dL — ABNORMAL HIGH (ref 0.61–1.24)
GFR calc Af Amer: >60 mL/min (ref >60)
GFR calc non Af Amer: 53 mL/min — ABNORMAL LOW (ref >60)
Glucose, Bld: 94 mg/dL (ref 70–99)
Potassium: 4.1 mmol/L (ref 3.5–5.1)
Sodium: 143 mmol/L (ref 135–145)
Total Bilirubin: 4.2 mg/dL — ABNORMAL HIGH (ref 0.3–1.2)
Total Protein: 9.8 g/dL — ABNORMAL HIGH (ref 6.5–8.1)

## 2020-01-11 LAB — CBC WITH DIFFERENTIAL/PLATELET
Abs Immature Granulocytes: 0.05 10*3/uL (ref 0.00–0.07)
Basophils Absolute: 0 10*3/uL (ref 0.0–0.1)
Basophils Relative: 0 %
Eosinophils Absolute: 0 10*3/uL (ref 0.0–0.5)
Eosinophils Relative: 0 %
HCT: 54 % — ABNORMAL HIGH (ref 39.0–52.0)
Hemoglobin: 18.1 g/dL — ABNORMAL HIGH (ref 13.0–17.0)
Immature Granulocytes: 0 %
Lymphocytes Relative: 15 %
Lymphs Abs: 1.9 10*3/uL (ref 0.7–4.0)
MCH: 32.2 pg (ref 26.0–34.0)
MCHC: 33.5 g/dL (ref 30.0–36.0)
MCV: 96.1 fL (ref 80.0–100.0)
Monocytes Absolute: 1.4 10*3/uL — ABNORMAL HIGH (ref 0.1–1.0)
Monocytes Relative: 11 %
Neutro Abs: 9.5 10*3/uL — ABNORMAL HIGH (ref 1.7–7.7)
Neutrophils Relative %: 74 %
Platelets: 297 10*3/uL (ref 150–400)
RBC: 5.62 MIL/uL (ref 4.22–5.81)
RDW: 14.6 % (ref 11.5–15.5)
WBC: 12.8 10*3/uL — ABNORMAL HIGH (ref 4.0–10.5)
nRBC: 0 % (ref 0.0–0.2)

## 2020-01-11 LAB — BASIC METABOLIC PANEL
Anion gap: 12 (ref 5–15)
BUN: 21 mg/dL — ABNORMAL HIGH (ref 6–20)
CO2: 23 mmol/L (ref 22–32)
Calcium: 9.1 mg/dL (ref 8.9–10.3)
Chloride: 105 mmol/L (ref 98–111)
Creatinine, Ser: 1.36 mg/dL — ABNORMAL HIGH (ref 0.61–1.24)
GFR calc Af Amer: >60 mL/min (ref >60)
GFR calc non Af Amer: >60 mL/min (ref >60)
Glucose, Bld: 176 mg/dL — ABNORMAL HIGH (ref 70–99)
Potassium: 3.9 mmol/L (ref 3.5–5.1)
Sodium: 140 mmol/L (ref 135–145)

## 2020-01-11 LAB — RAPID URINE DRUG SCREEN, HOSP PERFORMED
Amphetamines: NOT DETECTED
Barbiturates: NOT DETECTED
Benzodiazepines: NOT DETECTED
Cocaine: NOT DETECTED
Opiates: NOT DETECTED
Tetrahydrocannabinol: NOT DETECTED

## 2020-01-11 LAB — ETHANOL: Alcohol, Ethyl (B): <10 mg/dL (ref <10)

## 2020-01-11 LAB — SARS CORONAVIRUS 2 BY RT PCR (HOSPITAL ORDER, PERFORMED IN ~~LOC~~ HOSPITAL LAB): SARS Coronavirus 2: NEGATIVE

## 2020-01-11 LAB — CK
Total CK: 576 U/L — ABNORMAL HIGH (ref 49–397)
Total CK: 505 U/L — ABNORMAL HIGH (ref 49–397)

## 2020-01-11 MED ORDER — BENZTROPINE MESYLATE 1 MG PO TABS: 1.0000 mg | ORAL_TABLET | Freq: Two times a day (BID) | ORAL | Status: DC

## 2020-01-11 MED ORDER — MIRTAZAPINE 15 MG PO TABS: 15.0000 mg | ORAL_TABLET | Freq: Every day | ORAL | Status: DC

## 2020-01-11 MED ORDER — QUETIAPINE FUMARATE 50 MG PO TABS
50.0000 mg | ORAL_TABLET | Freq: Every day | ORAL | Status: DC
  Administered 2020-01-11: 50 mg via ORAL
  Filled 2020-01-11: qty 1

## 2020-01-11 MED ORDER — SODIUM CHLORIDE 0.9 % IV BOLUS
1000.0000 mL | Freq: Once | INTRAVENOUS | Status: AC
  Administered 2020-01-11: 1000 mL via INTRAVENOUS

## 2020-01-11 MED ORDER — HALOPERIDOL 5 MG PO TABS: 5.0000 mg | ORAL_TABLET | Freq: Two times a day (BID) | ORAL | Status: DC

## 2020-01-11 MED ORDER — ALUM & MAG HYDROXIDE-SIMETH 200-200-20 MG/5ML PO SUSP: 30.0000 mL | ORAL | Status: DC | PRN

## 2020-01-11 MED ORDER — QUETIAPINE FUMARATE 25 MG PO TABS
25.0000 mg | ORAL_TABLET | Freq: Two times a day (BID) | ORAL | Status: DC
  Administered 2020-01-11: 25 mg via ORAL
  Filled 2020-01-11: qty 1

## 2020-01-11 MED ORDER — HALOPERIDOL 0.5 MG PO TABS: 1.0000 mg | ORAL_TABLET | Freq: Three times a day (TID) | ORAL | Status: DC

## 2020-01-11 MED ORDER — ACETAMINOPHEN 325 MG PO TABS: 650.0000 mg | ORAL_TABLET | Freq: Four times a day (QID) | ORAL | Status: DC | PRN

## 2020-01-11 MED ORDER — MAGNESIUM HYDROXIDE 400 MG/5ML PO SUSP: 30.0000 mL | Freq: Every day | ORAL | Status: DC | PRN

## 2020-01-11 NOTE — Progress Notes (Addendum)
Patient presented voluntarily to [facility] via Patent examiner. 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 TTS and provider attempted to interview patient with [place] interpreter. However, patient would not respond to any questions. Upon chart review, it was noted that the patient has participated in previous interviews in [place]. After several minutes, the patient began yelling in [place], running down the halls, banging on walls, and throwing chairs. Charged at security guard. Appeared to be responding to internal stimuli.  Patient was given Geodon 20 mg IM, Ativan 2 mg IM, and Benadryl 50 mg IM at 2328 without any resistance from the patient.    At this time patient is sleeping. Arousable, but drowsy. Respirations even and unlabored. No acute distress noted.   TTS initiated petition for involuntary commitment.   [employer] arrived to transport patient to ED.  Patient will need TTS assessment once he is alert and able to participate in assessment.

## 2020-01-11 NOTE — Progress Notes (Signed)
Patient with bizarre behavior taking off shirt and banging on chest this evening. Patient with loud verbalizations and aggressive behavior towards staff . Pt charged at Engineer, materials. Patient given Sandy Salaam, Benadryl, and ativan per MD order  (see mar for administration). [facility] and EMS at facility to transport patient to [facility]. Patient transferred to EMS stretcher without incident. Report given to EMS. reopert called to [facility] By [facility] RN.

## 2020-01-11 NOTE — ED Provider Notes (Signed)
Patient was transferred from behavioral health urgent care after he had presented in a catatonic state and then he suddenly became agitated and was threatening to the staff.  He has been sedated.  The IVC papers were started at that facility.  When I observe the patient he is sleeping.  He does not respond when I say his name or shake his leg.  Nursing staff however states he did respond to painful stimuli when they tried to draw his blood.  First opinion papers were filled out and signed by me.  Review of his chart shows he has had similar episodes in the past.  His main diagnosis is paranoid schizophrenia  Medical screening examination/treatment/procedure(s) were conducted as a shared visit with non-physician practitioner(s) and myself.  I personally evaluated the patient during the encounter.  EKG Interpretation  Date/Time:  Tuesday January 11 2020 03:20:20 EDT Ventricular Rate:  89 PR Interval:    QRS Duration: 79 QT Interval:  338 QTC Calculation: 412 R Axis:   64 Text Interpretation: Sinus rhythm RSR' in V1 or V2, right VCD or RVH Borderline ST elevation, anterior leads No significant change since last tracing 29 Aug 2018 Confirmed by Devoria Albe ([id number]) on 01/11/2020 3:50:40 AM  Devoria Albe, MD, Concha Pyo, MD 01/11/20 [phone]

## 2020-01-11 NOTE — ED Notes (Signed)
Pt alert and ambulatory on approach. Pt is pacing on the unit and occasionally standing at the nurses' station. Pt has been cooperative. Pt denies A/VH and SI/HI. Support and encouragement provided. Pt remains safe on the unit.

## 2020-01-11 NOTE — ED Notes (Addendum)
Pt admitted to continous assessment unit for observation from [facility]. Pt stable. Belongings stored in locker. NP and social work talking in flex area with pt. Pt oriented to unit and staff.
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
## 2020-01-11 NOTE — BH Assessment (Signed)
[facility] Assessment Progress Note  Per Shuvon Rankin, FNP, pt is to be is transferred to the [facility].  Reola Calkins, NP and Randa Evens, RN agree to accept pt.  Please call report to [phone].  Pt is to be transported via [employer].  Pt's nurse has been notified.  Doylene Canning, [place] Behavioral Health Coordinator [phone]

## 2020-01-11 NOTE — Discharge Instructions (Signed)
Patient transferred to ED.

## 2020-01-11 NOTE — Consult Note (Signed)
[facility] Psych ED Progress Note  01/11/2020 2:22 PM Peter Golden  MRN:  [id number] Subjective:   Peter Golden reports that he is doing well at this time.  He stated he slept okay.  When asked what brought him into the hospital he stated something happened to him and he became very nervous.  Patient appeared to be guarded and restricted and would not provide any further details about what took place.  During the evaluation he was able to speak English the entire assessment, and he remained calm and cooperative.   He denies suicidal ideation, homicidal ideation, and or auditory visual hallucination.  Per safety sitter patient was discussing spirits and doing things against his we will and of his culture.  Per chart review it remains unclear if patient was catatonic and or psychotic on his initial examination, however he received multiple medications to help with stabilization.  He appears to present with normal thought processes and however remains disoriented at times.  Although he appears to improve, he will need ongoing monitoring.  Patient does have a significant history of psychosis and schizophrenia in which he denied.  He continues to have no insight into his mental illness at this time. He will continue to be monitored.   Principal Problem: Schizophrenia, paranoid, chronic (HCC) Diagnosis:  Principal Problem:   Schizophrenia, paranoid, chronic (HCC)  Total Time spent with patient: 15 minutes  Past Psychiatric History: Schizoaffective disorder.  Past Medical History:  Past Medical History:  Diagnosis Date   Schizo affective schizophrenia (HCC)    Seizures (HCC)     Past Surgical History:  Procedure Laterality Date   IR THORACENTESIS ASP PLEURAL SPACE W/IMG GUIDE  04/22/2018   LAPAROTOMY N/A 04/15/2018   Procedure: EXPLORATORY LAPAROTOMY with splenectomy, transverse colectomy, partial gastrectomy, repair of diaphragmatic hernia, left 14 Fr pigtail tube thoracostomy, repair of  umbilical hernia;  Surgeon: Almond Lint, MD;  Location: [facility] OR;  Service: General;  Laterality: N/A;   XI ROBOTIC ASSISTED COLOSTOMY TAKEDOWN N/A 03/31/2019   Procedure: ROBOTIC ASSISTED COLOSTOMY REVERSAL, SPLENIC FLEXURE TAKEDOWN;  Surgeon: Romie Levee, MD;  Location: [facility] ORS;  Service: General;  Laterality: N/A;   Family History:  Family History  Problem Relation Age of Onset   Mental illness Neg Hx    Family Psychiatric  History: None  Social History:  Social History   Substance and Sexual Activity  Alcohol Use Not Currently   Comment: BAC was not available at time of assessment     Social History   Substance and Sexual Activity  Drug Use Not Currently   Comment: UDS not available at time of assessment    Social History   Socioeconomic History   Marital status: Single    Spouse name: Not on file   Number of children: Not on file   Years of education: Not on file   Highest education level: Not on file  Occupational History   Not on file  Tobacco Use   Smoking status: Never Smoker   Smokeless tobacco: Never Used  Vaping Use   Vaping Use: Never used  Substance and Sexual Activity   Alcohol use: Not Currently    Comment: BAC was not available at time of assessment   Drug use: Not Currently    Comment: UDS not available at time of assessment   Sexual activity: Yes    Birth control/protection: None  Other Topics Concern   Not on file  Social History Narrative   ** Merged History Encounter **  Social Determinants of Health   Financial Resource Strain:    Difficulty of Paying Living Expenses:   Food Insecurity:    Worried About Programme researcher, broadcasting/film/videounning Out of Food in the Last Year:    Baristaan Out of Food in the Last Year:   Transportation Needs:    Freight forwarderLack of Transportation (Medical):    Lack of Transportation (Non-Medical):   Physical Activity:    Days of Exercise per Week:    Minutes of Exercise per Session:   Stress:    Feeling of Stress :    Social Connections:    Frequency of Communication with Friends and Family:    Frequency of Social Gatherings with Friends and Family:    Attends Religious Services:    Active Member of Clubs or Organizations:    Attends Engineer, structuralClub or Organization Meetings:    Marital Status:     Sleep: Good  Appetite:  Good  Current Medications: Current Facility-Administered Medications  Medication Dose Route Frequency Provider Last Rate Last Admin   haloperidol (HALDOL) tablet 5 mg  5 mg Oral BID Malvin JohnsFarah, Brian, MD       Current Outpatient Medications  Medication Sig Dispense Refill   benztropine (COGENTIN) 1 MG tablet Take 1 tablet (1 mg total) by mouth 2 (two) times daily. (Patient not taking: Reported on 03/04/2019) 60 tablet 2   docusate sodium (COLACE) 100 MG capsule Take 1 capsule (100 mg total) by mouth 2 (two) times daily as needed for mild constipation. (Patient not taking: Reported on 03/04/2019) 10 capsule 0   gabapentin (NEURONTIN) 300 MG capsule Take 1 capsule (300 mg total) by mouth 3 (three) times daily. (Patient not taking: Reported on 01/11/2020) 90 capsule 2   haloperidol (HALDOL) 1 MG tablet Take 1 tablet (1 mg total) by mouth 3 (three) times daily. (Patient not taking: Reported on 01/11/2020) 90 tablet 2   haloperidol decanoate (HALDOL DECANOATE) 100 MG/ML injection Inject 1 mL (100 mg total) into the muscle every 30 (thirty) days. Next due 4/3 (Patient not taking: Reported on 01/11/2020) 1 mL 11   levETIRAcetam (KEPPRA) 500 MG tablet Take 1 tablet (500 mg total) by mouth 2 (two) times daily for 30 days. (Patient not taking: Reported on 03/04/2019) 60 tablet 0   mirtazapine (REMERON) 15 MG tablet Take 1 tablet (15 mg total) by mouth at bedtime. For depression/sleep (Patient not taking: Reported on 01/11/2020) 30 tablet 2   Ostomy Supplies (CLOSED-END COLOSTOMY POUCH) MISC Use as directed 30 each 11   oxyCODONE 10 MG TABS Take 1 tablet (10 mg total) by mouth every 8 (eight) hours as  needed for moderate pain or severe pain. (Patient not taking: Reported on 01/11/2020) 15 tablet 0    Lab Results:  Results for orders placed or performed during the hospital encounter of 01/11/20 (from the past 48 hour(s))  Urine rapid drug screen (hosp performed)     Status: None   Collection Time: 01/11/20  3:14 AM  Result Value Ref Range   Opiates NONE DETECTED NONE DETECTED   Cocaine NONE DETECTED NONE DETECTED   Benzodiazepines NONE DETECTED NONE DETECTED   Amphetamines NONE DETECTED NONE DETECTED   Tetrahydrocannabinol NONE DETECTED NONE DETECTED   Barbiturates NONE DETECTED NONE DETECTED    Comment: (NOTE) DRUG SCREEN FOR MEDICAL PURPOSES ONLY.  IF CONFIRMATION IS NEEDED FOR ANY PURPOSE, NOTIFY LAB WITHIN 5 DAYS.  LOWEST DETECTABLE LIMITS FOR URINE DRUG SCREEN Drug Class  Cutoff (ng/mL) Amphetamine and metabolites    1000 Barbiturate and metabolites    200 Benzodiazepine                 200 Tricyclics and metabolites     300 Opiates and metabolites        300 Cocaine and metabolites        300 THC                            50 Performed at [facility], [address], [place], [place] [address]   SARS Coronavirus 2 by RT PCR (hospital order, performed in [facility] hospital lab) Nasopharyngeal Nasopharyngeal Swab     Status: None   Collection Time: 01/11/20  3:55 AM   Specimen: Nasopharyngeal Swab  Result Value Ref Range   SARS Coronavirus 2 NEGATIVE NEGATIVE    Comment: (NOTE) SARS-CoV-2 target nucleic acids are NOT DETECTED.  The SARS-CoV-2 RNA is generally detectable in upper and lower respiratory specimens during the acute phase of infection. The lowest concentration of SARS-CoV-2 viral copies this assay can detect is 250 copies / mL. A negative result does not preclude SARS-CoV-2 infection and should not be used as the sole basis for treatment or other patient management decisions.  A negative result may occur  with improper specimen collection / handling, submission of specimen other than nasopharyngeal swab, presence of viral mutation(s) within the areas targeted by this assay, and inadequate number of viral copies (<250 copies / mL). A negative result must be combined with clinical observations, patient history, and epidemiological information.  Fact Sheet for Patients:   BoilerBrush.com.cy  Fact Sheet for Healthcare Providers: https://pope.com/  This test is not yet approved or  cleared by the [place] FDA and has been authorized for detection and/or diagnosis of SARS-CoV-2 by FDA under an Emergency Use Authorization (EUA).  This EUA will remain in effect (meaning this test can be used) for the duration of the COVID-19 declaration under Section 564(b)(1) of the Act, 21 U.S.C. section 360bbb-3(b)(1), unless the authorization is terminated or revoked sooner.  Performed at [facility], [address] [place], [place] [address]   Comprehensive metabolic panel     Status: Abnormal   Collection Time: 01/11/20  4:28 AM  Result Value Ref Range   Sodium 143 135 - 145 mmol/L   Potassium 4.1 3.5 - 5.1 mmol/L   Chloride 103 98 - 111 mmol/L   CO2 21 (L) 22 - 32 mmol/L   Glucose, Bld 94 70 - 99 mg/dL    Comment: Glucose reference range applies only to samples taken after fasting for at least 8 hours.   BUN 20 6 - 20 mg/dL   Creatinine, Ser 6.78 (H) 0.61 - 1.24 mg/dL   Calcium 93.8 (H) 8.9 - 10.3 mg/dL   Total Protein 9.8 (H) 6.5 - 8.1 g/dL   Albumin 5.7 (H) 3.5 - 5.0 g/dL   AST 34 15 - 41 U/L   ALT 16 0 - 44 U/L   Alkaline Phosphatase 58 38 - 126 U/L   Total Bilirubin 4.2 (H) 0.3 - 1.2 mg/dL   GFR calc non Af Amer 53 (L) >60 mL/min   GFR calc Af Amer >60 >60 mL/min   Anion gap 19 (H) 5 - 15    Comment: Performed at [facility], [address] [place], [place] [address]  Ethanol  Status: None    Collection Time: 01/11/20  4:28 AM  Result Value Ref Range   Alcohol, Ethyl (B) <10 <10 mg/dL    Comment: (NOTE) Lowest detectable limit for serum alcohol is 10 mg/dL.  For medical purposes only. Performed at [facility], [address] [place], [place] [address]   CBC with Diff     Status: Abnormal   Collection Time: 01/11/20  4:28 AM  Result Value Ref Range   WBC 12.8 (H) 4.0 - 10.5 K/uL   RBC 5.62 4.22 - 5.81 MIL/uL   Hemoglobin 18.1 (H) 13.0 - 17.0 g/dL   HCT 20.9 (H) 39 - 52 %   MCV 96.1 80.0 - 100.0 fL   MCH 32.2 26.0 - 34.0 pg   MCHC 33.5 30.0 - 36.0 g/dL   RDW 47.0 96.2 - 83.6 %   Platelets 297 150 - 400 K/uL   nRBC 0.0 0.0 - 0.2 %   Neutrophils Relative % 74 %   Neutro Abs 9.5 (H) 1.7 - 7.7 K/uL   Lymphocytes Relative 15 %   Lymphs Abs 1.9 0.7 - 4.0 K/uL   Monocytes Relative 11 %   Monocytes Absolute 1.4 (H) 0 - 1 K/uL   Eosinophils Relative 0 %   Eosinophils Absolute 0.0 0 - 0 K/uL   Basophils Relative 0 %   Basophils Absolute 0.0 0 - 0 K/uL   Immature Granulocytes 0 %   Abs Immature Granulocytes 0.05 0.00 - 0.07 K/uL    Comment: Performed at [facility], [address], [place], [place] [address]  CK     Status: Abnormal   Collection Time: 01/11/20  4:28 AM  Result Value Ref Range   Total CK 576 (H) 49.0 - 397.0 U/L    Comment: Performed at [facility], [address] [place], [place] [address]  Basic metabolic panel     Status: Abnormal   Collection Time: 01/11/20  9:16 AM  Result Value Ref Range   Sodium 140 135 - 145 mmol/L   Potassium 3.9 3.5 - 5.1 mmol/L   Chloride 105 98 - 111 mmol/L   CO2 23 22 - 32 mmol/L   Glucose, Bld 176 (H) 70 - 99 mg/dL    Comment: Glucose reference range applies only to samples taken after fasting for at least 8 hours.   BUN 21 (H) 6 - 20 mg/dL   Creatinine, Ser 0.35 (H) 0.61 - 1.24 mg/dL   Calcium 9.1 8.9 - 46.5 mg/dL   GFR calc non Af Amer >60 >60 mL/min    GFR calc Af Amer >60 >60 mL/min   Anion gap 12 5 - 15    Comment: Performed at [facility], [address] [place], [place] [address]  CK     Status: Abnormal   Collection Time: 01/11/20  9:16 AM  Result Value Ref Range   Total CK 505 (H) 49.0 - 397.0 U/L    Comment: Performed at [facility], [address], [place], [place] [address]    Blood Alcohol level:  Lab Results  Component Value Date   [facility] <10 01/11/2020   ETH <10 08/29/2018    Musculoskeletal: Strength & Muscle Tone: within normal limits Gait & Station: UTA since patient is lying in bed. Patient leans: N/A  Psychiatric Specialty Exam: Physical Exam Vitals and nursing note reviewed.  Constitutional:      Appearance: He is well-developed.  HENT:  Head: Normocephalic and atraumatic.  Pulmonary:     Effort: Pulmonary effort is normal.  Musculoskeletal:        General: Normal range of motion.     Cervical back: Normal range of motion.  Neurological:     Mental Status: He is alert and oriented to person, place, and time.  Psychiatric:        Mood and Affect: Affect is blunt.        Speech: Speech normal.        Behavior: Behavior normal.        Thought Content: Thought content normal.        Cognition and Memory: Memory is impaired.        Judgment: Judgment is impulsive.     Review of Systems  Psychiatric/Behavioral: Negative for hallucinations and suicidal ideas.  All other systems reviewed and are negative.   Blood pressure 136/73, pulse 97, temperature 98.5 F (36.9 C), temperature source Oral, resp. rate 20, SpO2 99 %.There is no height or weight on file to calculate BMI.  General Appearance: Fairly Groomed, young, African male, wearing hospital scrubs who is lying in bed. NAD.   Eye Contact:  Fair  Speech:  Clear and Coherent and Slow  Volume:  Decreased  Mood:  Depressed and Dysphoric  Affect:  Blunt, Labile and Full Range  Thought Process:  Coherent,  Irrelevant and Descriptions of Associations: Intact  Orientation:  Full (Time, Place, and Person)  Thought Content:  Logical  Suicidal Thoughts:  No  Homicidal Thoughts:  No  Memory:  Immediate;   Fair Recent;   Fair Remote;   Fair  Judgement:  Poor  Insight:  Lacking  Psychomotor Activity:  Normal  Concentration:  Concentration: Fair and Attention Span: Fair  Recall:  [employer] of Knowledge:  Fair  Language:  Good  Akathisia:  No  Handed:  Right  AIMS (if indicated):   N/A  Assets:  Communication Skills Desire for Improvement Housing  ADL's:  Intact  Cognition:  Impaired due to psychiatric condition.   Sleep:   Okay   Assessment:  Peter Golden is a 39 y.o. male who was admitted with agitation and psychosis in the setting of poor medication compliance. He had one episode of agitation last night when he presented to the [facility]. He continues to observation however may likely need inpatient although he responded well to IM medications last night.    Treatment Plan Summary: -Start Cogentin 1 mg daily for EPS prophylaxis.  -Start Haldol 5 mg BID for psychosis.    Maryagnes Amos, FNP 01/11/2020, 2:22 PM  Review of Systems  Psychiatric/Behavioral: Negative for hallucinations and suicidal ideas.  All other systems reviewed and are negative.

## 2020-01-11 NOTE — ED Notes (Addendum)
A1C and Lipid panel blood draw stick unsuccessful. RN encouraged pt to drink fluids.

## 2020-01-11 NOTE — ED Provider Notes (Signed)
Behavioral Health Admission H&P [facility] & OBS)  Date: 01/11/20 Patient Name: Peter Golden MRN: [id number] Chief Complaint:  Chief Complaint  Patient presents with  . Psychiatric Evaluation      Diagnoses:  Final diagnoses:  GAD (generalized anxiety disorder)  Adjustment disorder with mixed anxiety and depressed mood    HPI: Peter Golden, 39 y.o., male patient [facility] with confusion, disorganization, and selective mutism.  Patient sent to [facility] ED for medical clearance and reassessment this morning.  Patient admitted to [facility] continuous assessment.  Patient has prior history of 2 psychiatric hospitalization and diagnosed with schizophrenia.     Patient seen face to face by this provider, consulted with Dr. Lucianne Muss and chart reviewed on 01/11/20.  On evaluation Peter Golden reports prior to moving to [place] no psychiatric history.  States that he has stop taking psychotropic medications about 1-2 months ago related to them making him have headaches and "not feel right."  Patient states that he is a Holiday representative at [employer] and is living in a boarding house "It is nasty, can't take it anymore.  The people are nasty, they have dogs that are nasty, they do drugs, loud music.  I can't stay there anymore to nasty.  I work, go to school, but can't study cause of the people, the dogs, the music.  No, no, no, can't take it.  I need a job a good job where I make money.  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  I need a job in a office, I'm going to school, had education before I came here; but told me they don't recognize my degree from my country so had to start over,  I work, got shot at [employer] working, I can't take it.  I need good job, and my own apartment where I can study and finish school."  The more patient talks the more agitated he becomes related to not liking where is currently living an the people he is living with and wanting another place to  live and another job.  States he has no support.    He does not appear to be responding to internal/external stimuli or delusional thoughts.  Patient denies suicidal/self-harm/homicidal ideation, psychosis, and paranoia.  Patient is agitated with living situation, school, and job.  States that he does not want to take any medication because he doesn't need it "just find me a place to stay, my own apartment, good job.       PHQ 2-9:     [facility]  C-SSRS RISK CATEGORY No Risk       Total Time spent with patient: 45 minutes  Musculoskeletal  Strength & Muscle Tone: within normal limits Gait & Station: normal Patient leans: N/A  Psychiatric Specialty Exam  Presentation General Appearance: Appropriate for Environment;Casual;Fairly Groomed  Eye Contact:Good  Speech:Pressured;Other (comment) (Patient has a very strong accent; and difficult to understand if talking fast and when upset; when asked to slow down/calm down he does)  Speech Volume:Increased  Handedness:Right   Mood and Affect  Mood:Anxious;Depressed;Irritable  Affect:Congruent;Depressed   Thought Process  Thought Processes:Coherent;Goal Directed;Linear  Descriptions of Associations:Circumstantial  Orientation:No data recorded Thought Content:Rumination;Tangential  Hallucinations:Hallucinations: None  Ideas of Reference:None  Suicidal Thoughts:Suicidal Thoughts: No  Homicidal Thoughts:Homicidal Thoughts: No   Sensorium  Memory:Immediate Good;Remote Fair;Recent Fair  Judgment:Fair  Insight:Present;Fair   Psychologist, educational  Functions  Concentration:Fair  Attention Span:Good  Recall:Fair  Fund of Knowledge:Fair  Language:Fair   Psychomotor Activity  Psychomotor Activity:Psychomotor Activity: Restlessness   Assets  Assets:Desire for Improvement;Housing   Sleep  Sleep:Sleep: Poor (Patient states that he has not been  sleepig)   Physical Exam Vitals and nursing note reviewed.  Constitutional:      Appearance: Normal appearance.  Pulmonary:     Effort: Pulmonary effort is normal.  Musculoskeletal:        General: Normal range of motion.  Skin:    General: Skin is warm and dry.  Neurological:     Mental Status: He is alert.  Psychiatric:        Attention and Perception: Attention and perception normal.        Mood and Affect: Mood is anxious. Affect is labile and angry.        Speech: Speech normal.        Behavior: Behavior is agitated. Behavior is cooperative.        Thought Content: Thought content is not paranoid or delusional. Thought content does not include homicidal or suicidal ideation.        Cognition and Memory: Cognition and memory normal.        Judgment: Judgment is impulsive.     Comments: Very strong accent, difficult to understand when patient starts talking fast and yelling    Review of Systems  Psychiatric/Behavioral: Positive for depression. Negative for hallucinations, memory loss, substance abuse and suicidal ideas. The patient is nervous/anxious and has insomnia.   All other systems reviewed and are negative.   There were no vitals taken for this visit. There is no height or weight on file to calculate BMI.  Past Psychiatric History: Schizophrenia   Is the patient at risk to self? No  Has the patient been a risk to self in the past 6 months? No .    Has the patient been a risk to self within the distant past? No   Is the patient a risk to others? No   Has the patient been a risk to others in the past 6 months? No   Has the patient been a risk to others within the distant past? No   Past Medical History:  Past Medical History:  Diagnosis Date  . Schizo affective schizophrenia (HCC)   . Seizures (HCC)     Past Surgical History:  Procedure Laterality Date  . IR THORACENTESIS ASP PLEURAL SPACE W/IMG GUIDE  04/22/2018  . LAPAROTOMY N/A 04/15/2018   Procedure:  EXPLORATORY LAPAROTOMY with splenectomy, transverse colectomy, partial gastrectomy, repair of diaphragmatic hernia, left 14 Fr pigtail tube thoracostomy, repair of umbilical hernia;  Surgeon: Almond Lint, MD;  Location: [facility] OR;  Service: General;  Laterality: N/A;  . XI ROBOTIC ASSISTED COLOSTOMY TAKEDOWN N/A 03/31/2019   Procedure: ROBOTIC ASSISTED COLOSTOMY REVERSAL, SPLENIC FLEXURE TAKEDOWN;  Surgeon: Romie Levee, MD;  Location: [facility] ORS;  Service: General;  Laterality: N/A;    Family History:  Family History  Problem Relation Age of Onset  . Mental illness Neg Hx     Social History:  Social History   Socioeconomic History  . Marital status: Single    Spouse name: Not on file  . Number of children: Not on file  . Years of education: Not on file  . Highest education level: Not on file  Occupational History  . Not on file  Tobacco Use  . Smoking status: Never Smoker  . Smokeless tobacco: Never  Used  Vaping Use  . Vaping Use: Never used  Substance and Sexual Activity  . Alcohol use: Not Currently    Comment: BAC was not available at time of assessment  . Drug use: Not Currently    Comment: UDS not available at time of assessment  . Sexual activity: Yes    Birth control/protection: None  Other Topics Concern  . Not on file  Social History Narrative   ** Merged History Encounter **       Social Determinants of Health   Financial Resource Strain:   . Difficulty of Paying Living Expenses:   Food Insecurity:   . Worried About Programme researcher, broadcasting/film/video in the Last Year:   . Barista in the Last Year:   Transportation Needs:   . Freight forwarder (Medical):   Marland Kitchen Lack of Transportation (Non-Medical):   Physical Activity:   . Days of Exercise per Week:   . Minutes of Exercise per Session:   Stress:   . Feeling of Stress :   Social Connections:   . Frequency of Communication with Friends and Family:   . Frequency of Social Gatherings with Friends and Family:   .  Attends Religious Services:   . Active Member of Clubs or Organizations:   . Attends Banker Meetings:   Marland Kitchen Marital Status:   Intimate Partner Violence:   . Fear of Current or Ex-Partner:   . Emotionally Abused:   Marland Kitchen Physically Abused:   . Sexually Abused:     SDOH:  SDOH Screenings   Alcohol Screen:   . Last Alcohol Screening Score (AUDIT):   Depression (PHQ2-9):   . PHQ-2 Score:   Financial Resource Strain:   . Difficulty of Paying Living Expenses:   Food Insecurity:   . Worried About Programme researcher, broadcasting/film/video in the Last Year:   . [employer] of Food in the Last Year:   Housing:   . Last Housing Risk Score:   Physical Activity:   . Days of Exercise per Week:   . Minutes of Exercise per Session:   Social Connections:   . Frequency of Communication with Friends and Family:   . Frequency of Social Gatherings with Friends and Family:   . Attends Religious Services:   . Active Member of Clubs or Organizations:   . Attends Banker Meetings:   Marland Kitchen Marital Status:   Stress:   . Feeling of Stress :   Tobacco Use: Low Risk   . Smoking Tobacco Use: Never Smoker  . Smokeless Tobacco Use: Never Used  Transportation Needs:   . Freight forwarder (Medical):   Marland Kitchen Lack of Transportation (Non-Medical):     Last Labs:  Admission on 01/11/2020, Discharged on 01/11/2020  Component Date Value Ref Range Status  . SARS Coronavirus 2 01/11/2020 NEGATIVE  NEGATIVE Final   Comment: (NOTE) SARS-CoV-2 target nucleic acids are NOT DETECTED.  The SARS-CoV-2 RNA is generally detectable in upper and lower respiratory specimens during the acute phase of infection. The lowest concentration of SARS-CoV-2 viral copies this assay can detect is 250 copies / mL. A negative result does not preclude SARS-CoV-2 infection and should not be used as the sole basis for treatment or other patient management decisions.  A negative result may occur with improper specimen collection /  handling, submission of specimen other than nasopharyngeal swab, presence of viral mutation(s) within the areas targeted by this assay, and inadequate number of viral copies (<  250 copies / mL). A negative result must be combined with clinical observations, patient history, and epidemiological information.  Fact Sheet for Patients:   BoilerBrush.com.cyhttps://www.fda.gov/media/136312/download  Fact Sheet for Healthcare Providers: https://pope.com/https://www.fda.gov/media/136313/download  This test is not yet approved or                           cleared by the [place] FDA and has been authorized for detection and/or diagnosis of SARS-CoV-2 by FDA under an Emergency Use Authorization (EUA).  This EUA will remain in effect (meaning this test can be used) for the duration of the COVID-19 declaration under Section 564(b)(1) of the Act, 21 U.S.C. section 360bbb-3(b)(1), unless the authorization is terminated or revoked sooner.  Performed at [facility], [address] [place], [place] [address]   . Sodium 01/11/2020 143  135 - 145 mmol/L Final  . Potassium 01/11/2020 4.1  3.5 - 5.1 mmol/L Final  . Chloride 01/11/2020 103  98 - 111 mmol/L Final  . CO2 01/11/2020 21* 22 - 32 mmol/L Final  . Glucose, Bld 01/11/2020 94  70 - 99 mg/dL Final   Glucose reference range applies only to samples taken after fasting for at least 8 hours.  . BUN 01/11/2020 20  6 - 20 mg/dL Final  . Creatinine, Ser 01/11/2020 1.65* 0.61 - 1.24 mg/dL Final  . Calcium 60/45/409807/13/2021 10.6* 8.9 - 10.3 mg/dL Final  . Total Protein 01/11/2020 9.8* 6.5 - 8.1 g/dL Final  . Albumin 11/91/478207/13/2021 5.7* 3.5 - 5.0 g/dL Final  . AST 95/62/130807/13/2021 34  15 - 41 U/L Final  . ALT 01/11/2020 16  0 - 44 U/L Final  . Alkaline Phosphatase 01/11/2020 58  38 - 126 U/L Final  . Total Bilirubin 01/11/2020 4.2* 0.3 - 1.2 mg/dL Final  . GFR calc non Af Amer 01/11/2020 53* >60 mL/min Final  . GFR calc Af Amer 01/11/2020 >60  >60 mL/min Final  . Anion gap  01/11/2020 19* 5 - 15 Final   Performed at [facility], [address] [place], [place] [address]  . Alcohol, Ethyl (B) 01/11/2020 <10  <10 mg/dL Final   Comment: (NOTE) Lowest detectable limit for serum alcohol is 10 mg/dL.  For medical purposes only. Performed at [facility], [address] [place], [place] [address]   . Opiates 01/11/2020 NONE DETECTED  NONE DETECTED Final  . Cocaine 01/11/2020 NONE DETECTED  NONE DETECTED Final  . Benzodiazepines 01/11/2020 NONE DETECTED  NONE DETECTED Final  . Amphetamines 01/11/2020 NONE DETECTED  NONE DETECTED Final  . Tetrahydrocannabinol 01/11/2020 NONE DETECTED  NONE DETECTED Final  . Barbiturates 01/11/2020 NONE DETECTED  NONE DETECTED Final   Comment: (NOTE) DRUG SCREEN FOR MEDICAL PURPOSES ONLY.  IF CONFIRMATION IS NEEDED FOR ANY PURPOSE, NOTIFY LAB WITHIN 5 DAYS.  LOWEST DETECTABLE LIMITS FOR URINE DRUG SCREEN Drug Class                     Cutoff (ng/mL) Amphetamine and metabolites    1000 Barbiturate and metabolites    200 Benzodiazepine                 200 Tricyclics and metabolites     300 Opiates and metabolites        300 Cocaine and metabolites        300 THC  50 Performed at [facility], [address] [place], [place] [address]   . WBC 01/11/2020 12.8* 4.0 - 10.5 K/uL Final  . RBC 01/11/2020 5.62  4.22 - 5.81 MIL/uL Final  . Hemoglobin 01/11/2020 18.1* 13.0 - 17.0 g/dL Final  . HCT 35/46/5681 54.0* 39 - 52 % Final  . MCV 01/11/2020 96.1  80.0 - 100.0 fL Final  . MCH 01/11/2020 32.2  26.0 - 34.0 pg Final  . MCHC 01/11/2020 33.5  30.0 - 36.0 g/dL Final  . RDW 27/51/7001 14.6  11.5 - 15.5 % Final  . Platelets 01/11/2020 297  150 - 400 K/uL Final  . nRBC 01/11/2020 0.0  0.0 - 0.2 % Final  . Neutrophils Relative % 01/11/2020 74  % Final  . Neutro Abs 01/11/2020 9.5* 1.7 - 7.7 K/uL Final  . Lymphocytes Relative 01/11/2020 15  %  Final  . Lymphs Abs 01/11/2020 1.9  0.7 - 4.0 K/uL Final  . Monocytes Relative 01/11/2020 11  % Final  . Monocytes Absolute 01/11/2020 1.4* 0 - 1 K/uL Final  . Eosinophils Relative 01/11/2020 0  % Final  . Eosinophils Absolute 01/11/2020 0.0  0 - 0 K/uL Final  . Basophils Relative 01/11/2020 0  % Final  . Basophils Absolute 01/11/2020 0.0  0 - 0 K/uL Final  . Immature Granulocytes 01/11/2020 0  % Final  . Abs Immature Granulocytes 01/11/2020 0.05  0.00 - 0.07 K/uL Final   Performed at [facility], [address] [place], [place] [address]  . Total CK 01/11/2020 576* 49.0 - 397.0 U/L Final   Performed at [facility], [address] [place], [place] [address]  . Sodium 01/11/2020 140  135 - 145 mmol/L Final  . Potassium 01/11/2020 3.9  3.5 - 5.1 mmol/L Final  . Chloride 01/11/2020 105  98 - 111 mmol/L Final  . CO2 01/11/2020 23  22 - 32 mmol/L Final  . Glucose, Bld 01/11/2020 176* 70 - 99 mg/dL Final   Glucose reference range applies only to samples taken after fasting for at least 8 hours.  . BUN 01/11/2020 21* 6 - 20 mg/dL Final  . Creatinine, Ser 01/11/2020 1.36* 0.61 - 1.24 mg/dL Final  . Calcium 16/38/4665 9.1  8.9 - 10.3 mg/dL Final  . GFR calc non Af Amer 01/11/2020 >60  >60 mL/min Final  . GFR calc Af Amer 01/11/2020 >60  >60 mL/min Final  . Anion gap 01/11/2020 12  5 - 15 Final   Performed at [facility], [address] [place], [place] [address]  . Total CK 01/11/2020 505* 49.0 - 397.0 U/L Final   Performed at [facility], [address] [place], [place] [address]    Allergies: Patient has no known allergies.  PTA Medications: (Not in a hospital admission)   Medical Decision Making  Admitted to [facility] continuous assessment unit Started Seroquel 25 mg bid and 50 mg Q Hs for anxiety, mood stabilization, and sleep.  Patient refusing to take any other medication at this time     Recommendations  Based on my evaluation the patient does not appear to have an emergency medical condition.  Zacheriah Stumpe, NP 01/11/20  4:51 PM

## 2020-01-11 NOTE — ED Notes (Signed)
Safe transport made aware of transport to [facility].

## 2020-01-11 NOTE — ED Provider Notes (Signed)
Behavioral Health Medical Screening Exam  Peter Golden is a 39 y.o. male who presented to [facility] voluntarily to [facility]. 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 TTS and provider attempted to interview patient with [place] interpreter. However, patient would not respond to any questions. Upon chart review, it was noted that the patient has participated in previous interviews in [place]. After several minutes, the patient began yelling in [place], running down the halls, banging on walls, and throwing chairs. Charged at security guard.  Patient was given Geodon 20 mg IM, Ativan 2 mg IM, and Benadryl 50 mg IM at 2328 without any resistance from the patient.  At this time patient is sitting in chair in the exam room, sleeping. Respirations even and unlabored. No acute distress noted.   TTS initiated petition for involuntary commitment.   Patient will need TTS assessment once he is alert and able to participate in assessment.  Total Time spent with patient: 30 minutes  Psychiatric Specialty Exam  Presentation  General Appearance:Fairly Groomed;Appropriate for Environment  Eye Contact:Minimal  Speech:Pressured  Speech Volume:Increased  Handedness:No data recorded  Mood and Affect  Mood:Angry  Affect:Labile   Thought Process  Thought Processes:Other (comment) (patient is screaming/yelling in [place], unable to determine)  Descriptions of Associations:No data recorded Orientation:No data recorded Thought Content:No data recorded Hallucinations:Other (comment) (patient is screaming/yelling in [place], unable to determine)  Ideas of Reference:No data recorded Suicidal Thoughts:No data recorded Homicidal Thoughts:No data recorded  Sensorium  Memory:No data recorded Judgment:No data recorded Insight:No data recorded  Executive Functions  Concentration:No data recorded Attention Span:No data recorded Recall:No data recorded Fund of  Knowledge:No data recorded Language:No data recorded  Psychomotor Activity  Psychomotor Activity:Increased   Assets  Assets:No data recorded  Sleep  Sleep:No data recorded Number of hours: No data recorded  Physical Exam: Physical Exam Constitutional:      General: He is not in acute distress.    Appearance: Normal appearance. He is not ill-appearing, toxic-appearing or diaphoretic.  HENT:     Head: Normocephalic.     Right Ear: External ear normal.     Left Ear: External ear normal.  Eyes:     Pupils: Pupils are equal, round, and reactive to light.  Pulmonary:     Effort: Pulmonary effort is normal. No respiratory distress.  Musculoskeletal:        General: Normal range of motion.  Skin:    General: Skin is warm and dry.  Neurological:     Mental Status: He is alert.  Psychiatric:        Mood and Affect: Affect is labile.        Speech: Speech is rapid and pressured.    Review of Systems  Unable to perform ROS: Psychiatric disorder   Blood pressure 96/62, pulse 95, temperature 98.6 F (37 C), resp. rate 16, SpO2 100 %. There is no height or weight on file to calculate BMI.  Musculoskeletal: Strength & Muscle Tone: within normal limits Gait & Station: normal Patient leans: N/A   Recommendations:  Based on my evaluation the patient does not appear to have an emergency medical condition.   Patient will likely need inpatient treatment. Discussed with Leeann Must at [facility]. There is currently a bed available on [address], however, patient will need TTS assessment and medical clearance prior to acceptance at [facility].  Jackelyn Poling, NP 01/11/2020, 2:24 AM
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
## 2020-01-11 NOTE — BH Assessment (Signed)
Patient was brought to [facility] by [employer].  Patient record was reviewed and it was noted that patient spoke [place].  Clinician got the interpreter machine and contacted [place] interpreter.  Patient would not talk to interpreter nor this clinician nor Nira Conn, FNP.  Interpreter machine was put away and patient was given a snack.  A few minutes later patient became agitated.  He was pacing about and yelling in [place].  Security was present and assisted with redirection.  Patient was yelling and talking loudly as if talking to someone not there.  Pt was slapping his hands on the wall and pacing.    This clinician completed the affidavit & petition for IVC as the petitioner and had it notarized at [facility].  IVC papers were faxed to magistrate who initiated the findings & custody order.    [employer] picked up patient at 02:16.

## 2020-01-11 NOTE — ED Notes (Signed)
This RN stuck patient twice for blood work, both attempts unsuccessful.

## 2020-01-11 NOTE — ED Triage Notes (Signed)
Patient brought via [facility] with [employer] escort from [facility]. EMS reports pt initially went to behavioral health urgent care voluntarily. Once at [facility] patient " wigged" out and patient had to be sedated with Geodon, Ativan, and Benadryl IM. Patient was then sedated so he had to be transferred to ED for medical clearance. Patient is now an IVC. Patient also does not speak [place], he speaks [place].

## 2020-01-11 NOTE — ED Notes (Signed)
Called [facility] twice at [phone], no answer either time.

## 2020-01-12 MED ORDER — RISPERIDONE 1 MG PO TABS: 1.0000 mg | ORAL_TABLET | Freq: Every day | ORAL | Status: DC

## 2020-01-12 MED ORDER — HYDROXYZINE HCL 25 MG PO TABS
50.0000 mg | ORAL_TABLET | Freq: Once | ORAL | Status: AC
  Administered 2020-01-12: 50 mg via ORAL
  Filled 2020-01-12: qty 2

## 2020-01-12 MED ORDER — RISPERIDONE 2 MG PO TABS: 2.0000 mg | ORAL_TABLET | Freq: Every day | ORAL | Status: DC

## 2020-01-12 MED ORDER — RISPERIDONE 1 MG PO TABS
1.0000 mg | ORAL_TABLET | Freq: Every day | ORAL | Status: DC
  Administered 2020-01-12: 1 mg via ORAL
  Filled 2020-01-12: qty 1

## 2020-01-12 MED ORDER — ZIPRASIDONE MESYLATE 20 MG IM SOLR
20.0000 mg | Freq: Two times a day (BID) | INTRAMUSCULAR | Status: DC | PRN
  Administered 2020-01-12: 20 mg via INTRAMUSCULAR
  Filled 2020-01-12: qty 20

## 2020-01-12 NOTE — BH Assessment (Signed)
Per Reola Calkins, NP the patient is recommended for inpatient psychiatric treatment. There are no appropriate beds at [facility] at this time.   Disposition CSW referred the patient to following facilities for review:  [facility]  [facility]  [facility]  [facility]  [facility]  [facility]  [facility]  [facility]  [facility]  [facility]  [facility]  [facility]  [facility]  [facility]    Disposition CSW/TTS will continue to follow for potential placements.   Baldo Daub, MSW, LCSW Clinical Social Worker [facility]

## 2020-01-12 NOTE — ED Provider Notes (Signed)
Patient presents this morning sitting in the chair. He admits to having auditory hallucinations that he hears constantly. He has poor eye contact and refuses to answer any specifics about the voices. Nursing reports that he took his Seroquel, but did not sleep at all last night and sat up in the chair and didn't lay down. He has not bathed and has body odor, not eating even when offered food. The Seroquel did not seem to have an effect on the patient and he was switched to Risperdal today. I have contacted [facility] and requested a 500 hall. He has not been aggressive or threatening during this stay at the [facility].

## 2020-01-12 NOTE — ED Notes (Signed)
Pt talking loudly in native language and pacing. Appears agitated and responding to internal stimuli. Pt continues to be monitored. Pt remains safe on the unit.

## 2020-01-12 NOTE — ED Notes (Signed)
Pt. is restless, pacing and talking loud in a local dialect.

## 2020-01-12 NOTE — ED Notes (Signed)
Pt alternately pacing obs area speaking something other than English, appears to be RIS, and sitting in chair quietly picking at the socks on his feet. At one point did say clearly in English "What time is it? I need someone to drive me to church." Pt told time and that if after speaking to dr in the morning he was cleared to leave someone could assist in transportation to where he wanted to go. Pt agreeable then continued speaking, at times loudly, in other language while pacing.

## 2020-01-12 NOTE — Discharge Instructions (Signed)
Recommend to follow up with psychiatry

## 2020-01-12 NOTE — ED Notes (Signed)
Pt. is speaking very loud in local dialect. He is looking away from clinical staff. He does not respond in anyone. He continues to have a serous conversation with nobody.

## 2020-01-12 NOTE — ED Notes (Signed)
Pt. is still pacing but quite.

## 2020-01-12 NOTE — ED Notes (Signed)
Patient refused food.  Patient requested and given drink.  Patient is anxious and talking aloud to himself.  Patient is not responding to staff's directives.  Patient responding to internal stimuli. Patient is being given medication.

## 2020-01-12 NOTE — ED Notes (Signed)
Patient appears ccamer since receiving medication.  Patient wetn to sleep for short period of time.  Patient interacted with staff stating he wants to go home.  Patient is waiting calmly on unit.

## 2020-01-12 NOTE — ED Notes (Signed)
Pt. Still awake and pacing. Pt. took pleasure in counting all the white spots on his socks.

## 2020-01-12 NOTE — ED Notes (Signed)
Patient called for ride home.  Patient received washed clothing. Patient is cooperative

## 2020-01-12 NOTE — ED Provider Notes (Signed)
FBC/OBS ASAP Discharge Summary  Date and Time: 01/12/2020 1:18 PM  Name: Peter Golden  MRN:  [id number]   Discharge Diagnoses:  Final diagnoses:  GAD (generalized anxiety disorder)  Adjustment disorder with mixed anxiety and depressed mood    Subjective: Patient first reported this morning that he was having hallucinations and that he needed to stay in the hospital.  Patient was started on Risperdal and received some Geodon as well.  Patient slept.  Patient woke up and stated that he was fine and he was ready to go home.  Patient denied having any hallucinations and stated that he was not having any hallucinations earlier.  Patient reports to me that he has a place to live and he is not have any thoughts of wanting to harm himself or harm anyone else.  Patient stated that he needed to get back to his life.  Patient refuses to have a follow-up appointment made with outpatient services.  Patient states that he does not need medications when he discharges.  Stay Summary: Patient had originally presented to [facility] as a walk-in and due to his agitation at that time the patient was sent to the ED.  Patient was then assessed in the ED and was much more calmer and cooperative and was sent back [facility].  Patient was started on Seroquel 50 mg at bedtime and 25 mg twice daily.  It was reported the patient did not sleep and this morning the patient was started on Risperdal 1 mg every morning and 2 mg p.o. nightly.  Patient slept for a few hours and then when awoke patient was stating that he was feeling much better.  Unsure if patient was trying to seek secondary gain from the hospital of having a place to stay as his continue reports in the notes was needing new housing and trying to finish school.  Patient is employed as well.  Patient denied having any suicidal homicidal ideations and denies any hallucinations.  Patient was asked about his reported auditory hallucinations this morning and he  denied having those now.  Patient also refused medications as well as psychiatric follow-up when offered today.  Patient does not meet inpatient psychiatric treatment at this time and is psychiatrically cleared.  Total Time spent with patient: 30 minutes  Past Psychiatric History: Schizophrenia Past Medical History:  Past Medical History:  Diagnosis Date   Schizo affective schizophrenia (HCC)    Seizures (HCC)     Past Surgical History:  Procedure Laterality Date   IR THORACENTESIS ASP PLEURAL SPACE W/IMG GUIDE  04/22/2018   LAPAROTOMY N/A 04/15/2018   Procedure: EXPLORATORY LAPAROTOMY with splenectomy, transverse colectomy, partial gastrectomy, repair of diaphragmatic hernia, left 14 Fr pigtail tube thoracostomy, repair of umbilical hernia;  Surgeon: Almond LintByerly, Faera, MD;  Location: [facility] OR;  Service: General;  Laterality: N/A;   XI ROBOTIC ASSISTED COLOSTOMY TAKEDOWN N/A 03/31/2019   Procedure: ROBOTIC ASSISTED COLOSTOMY REVERSAL, SPLENIC FLEXURE TAKEDOWN;  Surgeon: Romie Leveehomas, Alicia, MD;  Location: [facility] ORS;  Service: General;  Laterality: N/A;   Family History:  Family History  Problem Relation Age of Onset   Mental illness Neg Hx    Family Psychiatric History: None reported Social History:  Social History   Substance and Sexual Activity  Alcohol Use Not Currently   Comment: BAC was not available at time of assessment     Social History   Substance and Sexual Activity  Drug Use Not Currently   Comment: UDS not  available at time of assessment    Social History   Socioeconomic History   Marital status: Single    Spouse name: Not on file   Number of children: Not on file   Years of education: Not on file   Highest education level: Not on file  Occupational History   Not on file  Tobacco Use   Smoking status: Never Smoker   Smokeless tobacco: Never Used  Vaping Use   Vaping Use: Never used  Substance and Sexual Activity   Alcohol use: Not Currently     Comment: BAC was not available at time of assessment   Drug use: Not Currently    Comment: UDS not available at time of assessment   Sexual activity: Yes    Birth control/protection: None  Other Topics Concern   Not on file  Social History Narrative   ** Merged History Encounter **       Social Determinants of Health   Financial Resource Strain:    Difficulty of Paying Living Expenses:   Food Insecurity:    Worried About Programme researcher, broadcasting/film/video in the Last Year:    Barista in the Last Year:   Transportation Needs:    Freight forwarder (Medical):    Lack of Transportation (Non-Medical):   Physical Activity:    Days of Exercise per Week:    Minutes of Exercise per Session:   Stress:    Feeling of Stress :   Social Connections:    Frequency of Communication with Friends and Family:    Frequency of Social Gatherings with Friends and Family:    Attends Religious Services:    Active Member of Clubs or Organizations:    Attends Engineer, structural:    Marital Status:    SDOH:  SDOH Screenings   Alcohol Screen:    Last Alcohol Screening Score (AUDIT):   Depression (PHQ2-9):    PHQ-2 Score:   Physicist, medical Strain:    Difficulty of Paying Living Expenses:   Food Insecurity:    Worried About Programme researcher, broadcasting/film/video in the Last Year:    Barista in the Last Year:   Housing:    Last Housing Risk Score:   Physical Activity:    Days of Exercise per Week:    Minutes of Exercise per Session:   Social Connections:    Frequency of Communication with Friends and Family:    Frequency of Social Gatherings with Friends and Family:    Attends Religious Services:    Active Member of Clubs or Organizations:    Attends Engineer, structural:    Marital Status:   Stress:    Feeling of Stress :   Tobacco Use: Low Risk    Smoking Tobacco Use: Never Smoker   Smokeless Tobacco Use: Never Used  Transportation Needs:     Freight forwarder (Medical):    Lack of Transportation (Non-Medical):     Has this patient used any form of tobacco in the last 30 days? (Cigarettes, Smokeless Tobacco, Cigars, and/or Pipes) A prescription for an FDA-approved tobacco cessation medication was offered at discharge and the patient refused  Current Medications:  Current Facility-Administered Medications  Medication Dose Route Frequency Provider Last Rate Last Admin   acetaminophen (TYLENOL) tablet 650 mg  650 mg Oral Q6H PRN Rankin, Shuvon B, NP       alum & mag hydroxide-simeth (MAALOX/MYLANTA) 200-200-20 MG/5ML suspension 30 mL  30 mL  Oral Q4H PRN Rankin, Shuvon B, NP       haloperidol (HALDOL) tablet 5 mg  5 mg Oral BID Malvin Johns, MD       magnesium hydroxide (MILK OF MAGNESIA) suspension 30 mL  30 mL Oral Daily PRN Rankin, Shuvon B, NP       risperiDONE (RISPERDAL) tablet 1 mg  1 mg Oral Daily Usama Harkless B, FNP   1 mg at 01/12/20 0834   risperiDONE (RISPERDAL) tablet 2 mg  2 mg Oral QHS Liese Dizdarevic, Feliz Beam B, FNP       ziprasidone (GEODON) injection 20 mg  20 mg Intramuscular Q12H PRN Haneef Hallquist, Gerlene Burdock, FNP   20 mg at 01/12/20 2595   Current Outpatient Medications  Medication Sig Dispense Refill   benztropine (COGENTIN) 1 MG tablet Take 1 tablet (1 mg total) by mouth 2 (two) times daily. (Patient not taking: Reported on 03/04/2019) 60 tablet 2   docusate sodium (COLACE) 100 MG capsule Take 1 capsule (100 mg total) by mouth 2 (two) times daily as needed for mild constipation. (Patient not taking: Reported on 03/04/2019) 10 capsule 0   gabapentin (NEURONTIN) 300 MG capsule Take 1 capsule (300 mg total) by mouth 3 (three) times daily. (Patient not taking: Reported on 01/11/2020) 90 capsule 2   haloperidol (HALDOL) 1 MG tablet Take 1 tablet (1 mg total) by mouth 3 (three) times daily. (Patient not taking: Reported on 01/11/2020) 90 tablet 2   haloperidol decanoate (HALDOL DECANOATE) 100 MG/ML injection Inject 1 mL  (100 mg total) into the muscle every 30 (thirty) days. Next due 4/3 (Patient not taking: Reported on 01/11/2020) 1 mL 11   levETIRAcetam (KEPPRA) 500 MG tablet Take 1 tablet (500 mg total) by mouth 2 (two) times daily for 30 days. (Patient not taking: Reported on 03/04/2019) 60 tablet 0   mirtazapine (REMERON) 15 MG tablet Take 1 tablet (15 mg total) by mouth at bedtime. For depression/sleep (Patient not taking: Reported on 01/11/2020) 30 tablet 2   Ostomy Supplies (CLOSED-END COLOSTOMY POUCH) MISC Use as directed 30 each 11   oxyCODONE 10 MG TABS Take 1 tablet (10 mg total) by mouth every 8 (eight) hours as needed for moderate pain or severe pain. (Patient not taking: Reported on 01/11/2020) 15 tablet 0    PTA Medications: (Not in a hospital admission)   Musculoskeletal  Strength & Muscle Tone: within normal limits Gait & Station: normal Patient leans: N/A  Psychiatric Specialty Exam  Presentation  General Appearance: Appropriate for Environment;Casual  Eye Contact:Good  Speech:Normal Rate;Clear and Coherent  Speech Volume:Normal  Handedness:Right   Mood and Affect  Mood:Euthymic  Affect:Congruent   Thought Process  Thought Processes:Coherent  Descriptions of Associations:Intact  Orientation:Full (Time, Place and Person)  Thought Content:WDL  Hallucinations:Hallucinations: None  Ideas of Reference:None  Suicidal Thoughts:Suicidal Thoughts: No  Homicidal Thoughts:Homicidal Thoughts: No   Sensorium  Memory:Immediate Good;Recent Good;Remote Good  Judgment:Fair  Insight:Fair   Executive Functions  Concentration:Fair  Attention Span:Good  Recall:Good  Fund of Knowledge:Fair  Language:Fair   Psychomotor Activity  Psychomotor Activity:Psychomotor Activity: Normal   Assets  Assets:Desire for Improvement;Housing;Physical Health;Social Support   Sleep  Sleep:Sleep: Fair   Physical Exam  Physical Exam Vitals and nursing note reviewed.   Constitutional:      Appearance: He is well-developed.  Cardiovascular:     Rate and Rhythm: Normal rate.  Pulmonary:     Effort: Pulmonary effort is normal.  Musculoskeletal:        General:  Normal range of motion.  Skin:    General: Skin is warm.  Neurological:     Mental Status: He is alert and oriented to person, place, and time.    Review of Systems  Constitutional: Negative.   HENT: Negative.   Eyes: Negative.   Respiratory: Negative.   Cardiovascular: Negative.   Gastrointestinal: Negative.   Genitourinary: Negative.   Musculoskeletal: Negative.   Skin: Negative.   Neurological: Negative.   Endo/Heme/Allergies: Negative.   Psychiatric/Behavioral: Negative.    Blood pressure (!) 164/101, pulse (!) 105, temperature (!) 96.6 F (35.9 C), temperature source Temporal, resp. rate 16, SpO2 100 %. There is no height or weight on file to calculate BMI.  Demographic Factors:  Male  Loss Factors: NA  Historical Factors: Impulsivity  Risk Reduction Factors:   Employed  Continued Clinical Symptoms:  Previous Psychiatric Diagnoses and Treatments  Cognitive Features That Contribute To Risk:  None    Suicide Risk:  Minimal: No identifiable suicidal ideation.  Patients presenting with no risk factors but with morbid ruminations; may be classified as minimal risk based on the severity of the depressive symptoms  Plan Of Care/Follow-up recommendations:  Continue activity as tolerated. Continue diet as recommended by your PCP. Ensure to keep all appointments with outpatient providers.  Disposition: Discharge home. Refused follow up  Maryfrances Bunnell, FNP 01/12/2020, 1:18 PM

## 2020-01-12 NOTE — ED Notes (Signed)
Pt alert and oriented on the unit. Education, support, and encouragement provided. Discharge summary, medications and follow up appointments reviewed with pt. Suicide prevention resources provided. Pt had no belongings in locker. Pt denies SI/HI, A/VH, pain, or any concerns at this time. Pt ambulatory on and off unit. Pt discharged to lobby. 

## 2020-01-12 NOTE — ED Notes (Signed)
Pt not sleeping, sitting up in reclined chair. When asked if he wanted medication he declined stating "I'm okay". Declined offer of blanket and other comfort items. After speaking to this writer is pacing obs area.

## 2020-01-13 ENCOUNTER — Ambulatory Visit (HOSPITAL_COMMUNITY)
Admission: EM | Admit: 2020-01-13 | Discharge: 2020-01-14 | Disposition: A | Discharge status: Home / Self Care | Payer: No Payment, Other | Attending: Family | Admitting: Family

## 2020-01-13 ENCOUNTER — Other Ambulatory Visit: Payer: Self-pay

## 2020-01-13 DIAGNOSIS — Z20822 Contact with and (suspected) exposure to covid-19: Secondary | ICD-10-CM | POA: Insufficient documentation

## 2020-01-13 DIAGNOSIS — R569 Unspecified convulsions: Secondary | ICD-10-CM | POA: Insufficient documentation

## 2020-01-13 DIAGNOSIS — F259 Schizoaffective disorder, unspecified: Secondary | ICD-10-CM

## 2020-01-13 DIAGNOSIS — Z79899 Other long term (current) drug therapy: Secondary | ICD-10-CM | POA: Insufficient documentation

## 2020-01-13 DIAGNOSIS — Z818 Family history of other mental and behavioral disorders: Secondary | ICD-10-CM | POA: Insufficient documentation

## 2020-01-13 DIAGNOSIS — F209 Schizophrenia, unspecified: Secondary | ICD-10-CM | POA: Insufficient documentation

## 2020-01-13 LAB — POC SARS CORONAVIRUS 2 AG: SARS Coronavirus 2 Ag: NEGATIVE

## 2020-01-13 LAB — POC SARS CORONAVIRUS 2 AG -  ED: SARS Coronavirus 2 Ag: NEGATIVE

## 2020-01-13 LAB — SARS CORONAVIRUS 2 BY RT PCR (HOSPITAL ORDER, PERFORMED IN ~~LOC~~ HOSPITAL LAB): SARS Coronavirus 2: NEGATIVE

## 2020-01-13 MED ORDER — ACETAMINOPHEN 325 MG PO TABS: 650.0000 mg | ORAL_TABLET | Freq: Four times a day (QID) | ORAL | Status: DC | PRN

## 2020-01-13 MED ORDER — HYDROXYZINE HCL 25 MG PO TABS: 25.0000 mg | ORAL_TABLET | Freq: Three times a day (TID) | ORAL | Status: DC | PRN

## 2020-01-13 MED ORDER — MAGNESIUM HYDROXIDE 400 MG/5ML PO SUSP: 30.0000 mL | Freq: Every day | ORAL | Status: DC | PRN

## 2020-01-13 MED ORDER — RISPERIDONE 1 MG PO TBDP
1.0000 mg | ORAL_TABLET | Freq: Once | ORAL | Status: AC
  Administered 2020-01-13: 1 mg via ORAL
  Filled 2020-01-13: qty 1

## 2020-01-13 MED ORDER — ALUM & MAG HYDROXIDE-SIMETH 200-200-20 MG/5ML PO SUSP: 30.0000 mL | ORAL | Status: DC | PRN

## 2020-01-13 NOTE — ED Notes (Signed)
Pt had to be coaxed out of floor twice.
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
## 2020-01-13 NOTE — ED Notes (Signed)
Pt. is extremely loud. He does not listen or pay attention to staff.

## 2020-01-13 NOTE — ED Notes (Signed)
Pt refused EKG. Pt states, "NO". Safety maintained.

## 2020-01-13 NOTE — ED Notes (Signed)
Pt observed talking to self in African [place] dialect. Able to be redirected. Pt denies concerns. Safety maintained.
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
## 2020-01-13 NOTE — ED Notes (Signed)
Pt making continuous loud grunting noises while standing in front of tv. Asked repeatedly to lower his volume. Pt stares blankly at staff with no response & no change in behavior. Writer asked if pt needs anything, pt repeated 'do I need anything' then walked away. Unable to engage pt further.
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
## 2020-01-14 MED ORDER — RISPERIDONE 1 MG PO TABS
1.0000 mg | ORAL_TABLET | Freq: Every day | ORAL | Status: DC
  Administered 2020-01-14: 1 mg via ORAL
  Filled 2020-01-14: qty 1

## 2020-01-14 MED ORDER — RISPERIDONE 2 MG PO TABS: 2.0000 mg | ORAL_TABLET | Freq: Every day | ORAL | Status: DC

## 2020-01-14 MED ORDER — RISPERIDONE 2 MG PO TABS: 2.0000 mg | ORAL_TABLET | Freq: Every day | ORAL | 0 refills | Status: DC

## 2020-01-14 MED ORDER — ZIPRASIDONE MESYLATE 20 MG IM SOLR: 20.0000 mg | Freq: Two times a day (BID) | INTRAMUSCULAR | Status: DC | PRN

## 2020-01-14 MED ORDER — RISPERIDONE 1 MG PO TABS: 1.0000 mg | ORAL_TABLET | Freq: Every day | ORAL | 0 refills | Status: DC

## 2020-01-14 NOTE — ED Notes (Signed)
Patient discharged to [facility] via [employer]. AVS/Prescriptions/Follow-Up reviewed with patient and written copies given to patient and he verbalized understanding. Patient escorted off unit to meet [employer] driver.

## 2020-01-14 NOTE — ED Notes (Signed)
Patient sitting in chair looking at television.
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
## 2020-01-14 NOTE — ED Notes (Signed)
Pt is awake, constantly slams his feet on the floor creating  noise.  Pt's actions awakes other patients but pt. continued despite being  asked to stop.

## 2020-01-14 NOTE — ED Notes (Addendum)
Patient alert. Patient is not engaging in conversation with staff this morning. Patient is sitting and staring at television and ambulating in room. Patient is cooperative and calm. Monitoring continues and patient remains safe on unit.

## 2020-02-22 ENCOUNTER — Encounter (HOSPITAL_COMMUNITY): Payer: Self-pay | Admitting: Psychiatry

## 2020-02-22 ENCOUNTER — Ambulatory Visit (HOSPITAL_COMMUNITY): Payer: Self-pay

## 2020-02-22 ENCOUNTER — Ambulatory Visit (HOSPITAL_COMMUNITY): Payer: Federal, State, Local not specified - Other | Admitting: *Deleted

## 2020-02-22 ENCOUNTER — Other Ambulatory Visit: Payer: Self-pay

## 2020-02-22 ENCOUNTER — Encounter (HOSPITAL_COMMUNITY): Payer: Self-pay

## 2020-02-22 ENCOUNTER — Ambulatory Visit (INDEPENDENT_AMBULATORY_CARE_PROVIDER_SITE_OTHER): Payer: No Payment, Other | Admitting: Psychiatry

## 2020-02-22 VITALS — BP 114/76 | HR 71 | Ht 68.0 in | Wt 162.5 lb

## 2020-02-22 DIAGNOSIS — F209 Schizophrenia, unspecified: Secondary | ICD-10-CM | POA: Diagnosis not present

## 2020-02-22 MED ORDER — BENZTROPINE MESYLATE 1 MG PO TABS: 1.0000 mg | ORAL_TABLET | Freq: Two times a day (BID) | ORAL | 2 refills | Status: DC

## 2020-02-22 MED ORDER — RISPERIDONE 4 MG PO TABS: 4.0000 mg | ORAL_TABLET | Freq: Every day | ORAL | 2 refills | Status: DC

## 2020-02-22 MED ORDER — LORAZEPAM 0.5 MG PO TABS: 0.5000 mg | ORAL_TABLET | Freq: Two times a day (BID) | ORAL | 2 refills | Status: DC

## 2020-02-22 MED ORDER — HALOPERIDOL DECANOATE 100 MG/ML IM SOLN
100.0000 mg | Freq: Once | INTRAMUSCULAR | Status: AC
  Administered 2020-02-22: 100 mg via INTRAMUSCULAR

## 2020-02-22 MED ORDER — HALOPERIDOL DECANOATE 100 MG/ML IM SOLN: 100.0000 mg | INTRAMUSCULAR | 11 refills | Status: DC

## 2020-02-22 NOTE — Progress Notes (Signed)
First appt with Clinical research associate for Haldol D injection. He was given per order 100 mg IM in R gluteal muscle. He tolerated it well. He was given education re the shot and verbalized his understanding. He was seen prior to getting his injection by Toy Cookey NP for initial eval. He states he has paranoia which is why he is getting Haldol. He is from [place] and [place] is his first language but he speaks very good [place]. He is a Consulting civil engineer at [employer]. He completed the PAP form for his Haldol D. Also given information re [facility] for his oral medications. He is indigent and is a Consulting civil engineer and has no income other than savings. To return in one month but states he is going home to [place] until early Nov so appt made for him on 11/8.
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
## 2020-03-09 ENCOUNTER — Telehealth (HOSPITAL_COMMUNITY): Payer: Self-pay | Admitting: *Deleted

## 2020-03-09 NOTE — Telephone Encounter (Signed)
Call from a woman named Mauri Brooklyn, she states she is trying to help him with his medications. Currently when checked on his Risperdal at [facility] his oral Risperdal was nearly 400. He recently lost his job and cant afford medicines at that cost. He also recieves a monthly injection of Haldol. Will discuss with NP changing PO meds to something more affordable or increasing his IM med dose. He leaves the end of Sept for [place] but gets his shot before he leaves. He will not be back until nov.

## 2020-03-10 ENCOUNTER — Telehealth (HOSPITAL_COMMUNITY): Payer: Self-pay | Admitting: *Deleted

## 2020-03-10 NOTE — Telephone Encounter (Signed)
Call from Mauri Brooklyn who identifies self as community peer support person for him concerned with his inability to afford his po meds. He has [employer] and with that thru [employer] its 388.00 Unsure why he is on both an injection and po meds, Angelique Blonder thought it was because he wanted to come off his shot and NP lowered it and started him on Risperdal, though Angelique Blonder is not 100 % sure of this. He has an appt for his shot on 9/23 and then he leaves for [place] till early Nov. Will try to work him in to see Dr that day to see if an adjustment can be made to make his medications more affordable. I spoke with [facility] and their suggestion was for him to cx his insurance and then he could get it for 4 or 10 dollars. Angelique Blonder will speak with him and his supportive community re options.

## 2020-03-13 ENCOUNTER — Other Ambulatory Visit (HOSPITAL_COMMUNITY): Payer: Self-pay | Admitting: Psychiatry

## 2020-03-13 ENCOUNTER — Telehealth (HOSPITAL_COMMUNITY): Payer: Self-pay | Admitting: *Deleted

## 2020-03-13 DIAGNOSIS — F209 Schizophrenia, unspecified: Secondary | ICD-10-CM

## 2020-03-13 MED ORDER — RISPERIDONE 4 MG PO TABS: 4.0000 mg | ORAL_TABLET | Freq: Every day | ORAL | 2 refills | Status: DC

## 2020-03-13 MED ORDER — LORAZEPAM 0.5 MG PO TABS: 0.5000 mg | ORAL_TABLET | Freq: Two times a day (BID) | ORAL | 2 refills | Status: DC

## 2020-03-13 NOTE — Telephone Encounter (Signed)
Call back from Mauri Brooklyn his peer support stating he is happy for the information his Risperdal 4 mg will be $4.00 at [facility], and she reminded writer he also needs his Ativan. I dont believe that one will be so expensive. SHe states he has a big support system that will help him with the expense if needed. Will ask NP to call Rx to [facility] on [address][phone].

## 2020-03-13 NOTE — Telephone Encounter (Signed)
Medications refilled and sent to preferred pharmacy.

## 2020-03-13 NOTE — Telephone Encounter (Signed)
Opened in error, no further documentation needed. 

## 2020-03-14 ENCOUNTER — Other Ambulatory Visit (HOSPITAL_COMMUNITY): Payer: Self-pay | Admitting: *Deleted

## 2020-03-14 ENCOUNTER — Other Ambulatory Visit (HOSPITAL_COMMUNITY): Payer: Self-pay | Admitting: Psychiatry

## 2020-03-14 DIAGNOSIS — F209 Schizophrenia, unspecified: Secondary | ICD-10-CM

## 2020-03-14 MED ORDER — RISPERIDONE 4 MG PO TABS: 4.0000 mg | ORAL_TABLET | Freq: Every day | ORAL | 2 refills | Status: DC

## 2020-03-14 MED ORDER — LORAZEPAM 0.5 MG PO TABS: 0.5000 mg | ORAL_TABLET | Freq: Two times a day (BID) | ORAL | 2 refills | Status: DC

## 2020-03-14 NOTE — Progress Notes (Unsigned)
Call from Mauri Brooklyn his peer support from [place], she is at [employer] and Rxs not there. Checked chart and they were called in to [facility]. Cx the RXs at [facility] and called them in to [place] at [phone]. Informed Brittney NP of the change.

## 2020-03-24 ENCOUNTER — Ambulatory Visit (HOSPITAL_COMMUNITY): Payer: No Payment, Other | Admitting: *Deleted

## 2020-03-24 ENCOUNTER — Encounter (HOSPITAL_COMMUNITY): Payer: Self-pay

## 2020-03-24 ENCOUNTER — Other Ambulatory Visit: Payer: Self-pay

## 2020-03-24 VITALS — BP 132/77 | HR 68 | Temp 97.8°F | Ht 68.0 in | Wt 168.0 lb

## 2020-03-24 DIAGNOSIS — F209 Schizophrenia, unspecified: Secondary | ICD-10-CM

## 2020-03-24 NOTE — Progress Notes (Signed)
In as scheduled for his injection of Haldol. A friend brought him to his appt this am. He is leaving for [place] next week, he has not been home for 18 years and is very excited to be going. He denies any problems, contacted Brittney NP re change in his meds due to conversations with his peer support person but he doesn't want to change, doesn't see a need for a change and did drop his insurance so ultimately his oral Risperdal became affordable without him having insurance. He still has not found a new job. Offered support, Injection of Haldol 100 mg Im given in R deltoid. To return in 6 weeks when returns from [place].

## 2020-05-08 ENCOUNTER — Encounter (HOSPITAL_COMMUNITY): Payer: Federal, State, Local not specified - Other | Admitting: Psychiatry

## 2020-05-08 ENCOUNTER — Other Ambulatory Visit: Payer: Self-pay

## 2020-05-08 ENCOUNTER — Ambulatory Visit (HOSPITAL_COMMUNITY): Payer: No Payment, Other | Admitting: *Deleted

## 2020-05-08 ENCOUNTER — Encounter (HOSPITAL_COMMUNITY): Payer: Self-pay

## 2020-05-08 VITALS — BP 126/82 | HR 69 | Ht 68.0 in | Wt 183.0 lb

## 2020-05-08 DIAGNOSIS — F209 Schizophrenia, unspecified: Secondary | ICD-10-CM

## 2020-05-08 MED ORDER — HALOPERIDOL DECANOATE 50 MG/ML IM SOLN
100.0000 mg | INTRAMUSCULAR | Status: AC
  Administered 2020-05-08 – 2021-08-07 (×12): 100 mg via INTRAMUSCULAR

## 2020-05-08 NOTE — Progress Notes (Signed)
In late today for his injection. Due to be being late today missed his appt with Toy Cookey NP who checking in with him as a three month assessment. He was given his shot per OK from provider and rescheduled with Brittney for mid Dec. He is just back from [place] where he spent a month visiting his family who he hasnt seen for 7 years. He said it was "wonderful". Denies any sx of his illness. 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 Injection of Haldol D 100 mg given in his L deltoid without difficulty. To return in one month for his next shot and also to see provider.

## 2020-05-24 ENCOUNTER — Ambulatory Visit: Payer: Self-pay | Attending: Nurse Practitioner | Admitting: Nurse Practitioner

## 2020-05-24 ENCOUNTER — Other Ambulatory Visit: Payer: Self-pay

## 2020-06-07 ENCOUNTER — Other Ambulatory Visit: Payer: Self-pay

## 2020-06-07 ENCOUNTER — Encounter (HOSPITAL_COMMUNITY): Payer: Self-pay

## 2020-06-07 ENCOUNTER — Ambulatory Visit (HOSPITAL_COMMUNITY): Payer: No Payment, Other | Admitting: *Deleted

## 2020-06-07 VITALS — BP 135/71 | HR 78 | Ht 68.0 in | Wt 188.0 lb

## 2020-06-07 DIAGNOSIS — F209 Schizophrenia, unspecified: Secondary | ICD-10-CM

## 2020-06-07 NOTE — Progress Notes (Signed)
In as scheduled for his monthly injection. 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 States he likes it and is working 5 days a week from 68 a - 7 p. He denies any issues. He answers appropriately to questions asked but doesn't initiate conversation or ask questions. His affect is flat. His shot was given in his R deltoid today of Haldol 100 mg without difficulty. He is scheduled to return in one month, 07/06/19 at 10.

## 2020-06-15 ENCOUNTER — Ambulatory Visit (INDEPENDENT_AMBULATORY_CARE_PROVIDER_SITE_OTHER): Payer: No Payment, Other | Admitting: Psychiatry

## 2020-06-15 ENCOUNTER — Encounter (HOSPITAL_COMMUNITY): Payer: Self-pay | Admitting: Psychiatry

## 2020-06-15 ENCOUNTER — Other Ambulatory Visit: Payer: Self-pay

## 2020-06-15 DIAGNOSIS — F209 Schizophrenia, unspecified: Secondary | ICD-10-CM | POA: Diagnosis not present

## 2020-06-15 MED ORDER — BENZTROPINE MESYLATE 1 MG PO TABS: 1.0000 mg | ORAL_TABLET | Freq: Two times a day (BID) | ORAL | 2 refills | Status: DC

## 2020-06-15 MED ORDER — LORAZEPAM 0.5 MG PO TABS: 0.5000 mg | ORAL_TABLET | Freq: Every day | ORAL | 2 refills | Status: DC

## 2020-06-15 MED ORDER — HALOPERIDOL DECANOATE 100 MG/ML IM SOLN: 100.0000 mg | INTRAMUSCULAR | 11 refills | Status: DC

## 2020-06-15 NOTE — Progress Notes (Signed)
BH MD/PA/NP OP Progress Note  06/15/2020 12:20 PM Peter Golden  MRN:  [id number]  Chief Complaint: "Im good" Chief Complaint    Medication Management       HPI: 39 year old male seen today for follow-up psychiatric evaluation.  He has a psychiatric history of paranoid schizophrenia, schizoaffective disorder, and depression.  He is currently managed on Haldol 100 mg q. 28 days, Ativan 0.5 mg twice daily, Risperdal 4 mg nightly, and Cogentin 1 mg twice daily.  He informed Clinical research associate that he has only been taking Ativan once daily he notes his medications are effective in managing his psychiatric conditions.  Also informed provider that he has not been taking Cogentin.  Today he is fairly groomed, pleasant, cooperative, engaged in conversation, and maintained eye contact.  His affect was flat and his speech was coherent but the voice was low.  Today he denies symptoms of anxiety depression.  Provider conducted a GAD-7 and patient scored a 0.  Provider also conducted a PHQ-9 and patient scored a 4.  He denies SI/HI/VAH or paranoia.  He informed Clinical research associate that he is doing well in school.  He notes that he still continues to play soccer and enjoys it.  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  Patient notes that he has not been taking his Cogentin however he is agreeable to restart it. He is also agreeable to reduce Ativan 0.5 mg twice daily to 0.5 mg once daily as this is what he has been taking.  He will discontinue Risperdal 4 mg to prevent adverse reactions while on Haldol.  He will continue all other medications as prescribed.  No other concerns noted at this time.  Visit Diagnosis:    ICD-10-CM   1. Schizophrenia, unspecified type (HCC)  F20.9 benztropine (COGENTIN) 1 MG tablet    haloperidol decanoate (HALDOL DECANOATE) 100 MG/ML injection    LORazepam (ATIVAN) 0.5 MG tablet    Past Psychiatric History: aranoid schizophrenia,  schizoaffective disorder, and depression.  Past Medical History:  Past Medical History:  Diagnosis Date  . Schizo affective schizophrenia (HCC)   . Seizures (HCC)     Past Surgical History:  Procedure Laterality Date  . IR THORACENTESIS ASP PLEURAL SPACE W/IMG GUIDE  04/22/2018  . LAPAROTOMY N/A 04/15/2018   Procedure: EXPLORATORY LAPAROTOMY with splenectomy, transverse colectomy, partial gastrectomy, repair of diaphragmatic hernia, left 14 Fr pigtail tube thoracostomy, repair of umbilical hernia;  Surgeon: Almond Lint, MD;  Location: [facility] OR;  Service: General;  Laterality: N/A;  . XI ROBOTIC ASSISTED COLOSTOMY TAKEDOWN N/A 03/31/2019   Procedure: ROBOTIC ASSISTED COLOSTOMY REVERSAL, SPLENIC FLEXURE TAKEDOWN;  Surgeon: Romie Levee, MD;  Location: [facility] ORS;  Service: General;  Laterality: N/A;    Family Psychiatric History: Denies  Family History:  Family History  Problem Relation Age of Onset  . Mental illness Neg Hx     Social History:  Social History   Socioeconomic History  . Marital status: Single    Spouse name: Not on file  . Number of children: Not on file  . Years of education: Not on file  . Highest education level: Not on file  Occupational History  . Not on file  Tobacco Use  . Smoking status: Never Smoker  . Smokeless tobacco: Never Used  Vaping Use  . Vaping Use: Never used  Substance and Sexual Activity  . Alcohol use: Not Currently    Comment: BAC was not available at time  of assessment  . Drug use: Not Currently    Comment: UDS not available at time of assessment  . Sexual activity: Yes    Birth control/protection: None  Other Topics Concern  . Not on file  Social History Narrative   ** Merged History Encounter **       Social Determinants of Health   Financial Resource Strain: Not on file  Food Insecurity: Not on file  Transportation Needs: Not on file  Physical Activity: Not on file  Stress: Not on file  Social Connections: Not on file     Allergies: No Known Allergies  Metabolic Disorder Labs: Lab Results  Component Value Date   HGBA1C 5.2 12/15/2015   MPG 103 12/15/2015   MPG 111 07/13/2015   Lab Results  Component Value Date   PROLACTIN 26.7 (H) 12/15/2015   PROLACTIN 47.9 (H) 07/13/2015   Lab Results  Component Value Date   CHOL 261 (H) 12/15/2015   TRIG 63 04/15/2018   HDL 69 12/15/2015   CHOLHDL 3.8 12/15/2015   VLDL 13 12/15/2015   LDLCALC 179 (H) 12/15/2015   LDLCALC 181 (H) 07/13/2015   Lab Results  Component Value Date   TSH 0.557 07/18/2018   TSH 1.800 12/15/2015    Therapeutic Level Labs: No results found for: LITHIUM No results found for: VALPROATE No components found for:  CBMZ  Current Medications: Current Outpatient Medications  Medication Sig Dispense Refill  . benztropine (COGENTIN) 1 MG tablet Take 1 tablet (1 mg total) by mouth 2 (two) times daily. 60 tablet 2  . gabapentin (NEURONTIN) 300 MG capsule Take 1 capsule (300 mg total) by mouth 3 (three) times daily. (Patient not taking: Reported on 01/11/2020) 90 capsule 2  . haloperidol decanoate (HALDOL DECANOATE) 100 MG/ML injection Inject 1 mL (100 mg total) into the muscle every 30 (thirty) days. Next due 4/3 1 mL 11  . LORazepam (ATIVAN) 0.5 MG tablet Take 1 tablet (0.5 mg total) by mouth daily. 30 tablet 2  . Ostomy Supplies (CLOSED-END COLOSTOMY POUCH) MISC Use as directed 30 each 11   Current Facility-Administered Medications  Medication Dose Route Frequency Provider Last Rate Last Admin  . haloperidol decanoate (HALDOL DECANOATE) 50 MG/ML injection 100 mg  100 mg Intramuscular Q30 days Toy Cookey E, NP   100 mg at 06/07/20 1039     Musculoskeletal: Strength & Muscle Tone: within normal limits Gait & Station: normal Patient leans: N/A  Psychiatric Specialty Exam: Review of Systems  Blood pressure 129/73, pulse 98, SpO2 100 %.There is no height or weight on file to calculate BMI.  General Appearance: Well  Groomed  Eye Contact:  Good  Speech:  Clear and Coherent and Normal Rate  Volume:  Decreased  Mood:  Euthymic  Affect:  Appropriate and Congruent  Thought Process:  Coherent, Goal Directed and Linear  Orientation:  Full (Time, Place, and Person)  Thought Content: WDL and Logical   Suicidal Thoughts:  No  Homicidal Thoughts:  No  Memory:  Immediate;   Good Recent;   Good Remote;   Good  Judgement:  Good  Insight:  Good  Psychomotor Activity:  Normal  Concentration:  Concentration: Good and Attention Span: Good  Recall:  Good  Fund of Knowledge: Good  Language: Good  Akathisia:  No  Handed:  Right  AIMS (if indicated): Not done  Assets:  Communication Skills Desire for Improvement Financial Resources/Insurance Housing Social Support Talents/Skills Vocational/Educational  ADL's:  Intact  Cognition: WNL  Sleep:  Good   Screenings: AIMS   Flowsheet Row [facility] Ad[facility] (Discharged) [facility] Admission (Discharged) from OP [facility] Admission (Discharged) [facility]  AIMS Total Score 0 0 0 0    AUDIT   Flowsheet Row Ad[facility]) [facility] Admission (Discharged) from OP [facility] Admission (Discharged) [facility]  Alcohol Use Disorder Identification Test Final Score (AUDIT) 0 0 0    GAD-7   Flowsheet Row Clinical Support from 06/15/2020 in [facility]  Total GAD-7 Score 0    PHQ2-9   Flowsheet Row Clinical Support from 06/15/2020 in [facility]  PHQ-2 Total Score 0  PHQ-9 Total Score 4        Assessment and Plan: Patient reports that he has been doing well.  He denies symptoms of anxiety, depression, or psychosis. Patient notes that he has not been taking his Cogentin however he is agreeable to restart it. He is also agreeable to reduce Ativan 0.5 mg twice daily to 0.5 mg once daily as this is what he has been taking.  He will discontinue Risperdal 4 mg to prevent adverse reactions while on Haldol.  He will continue all other medications as prescribed.   1. Schizophrenia, unspecified type (HCC)  Restart- benztropine (COGENTIN) 1 MG tablet; Take 1 tablet (1 mg total) by mouth 2 (two) times daily.  Dispense: 60 tablet; Refill: 2 Continue- haloperidol decanoate (HALDOL DECANOATE) 100 MG/ML injection; Inject 1 mL (100 mg total) into the muscle every 30 (thirty) days. Next due 4/3  Dispense: 1 mL; Refill: 11 Reduced- LORazepam (ATIVAN) 0.5 MG tablet; Take 1 tablet (0.5 mg total) by mouth daily.  Dispense: 30 tablet; Refill: 2  Follow up in 3 months Shanna Cisco, NP 06/15/2020, 12:20 PM

## 2020-07-05 ENCOUNTER — Encounter (HOSPITAL_COMMUNITY): Payer: Self-pay

## 2020-07-05 ENCOUNTER — Other Ambulatory Visit: Payer: Self-pay

## 2020-07-05 ENCOUNTER — Ambulatory Visit (HOSPITAL_COMMUNITY): Payer: No Payment, Other | Admitting: *Deleted

## 2020-07-05 VITALS — BP 109/64 | HR 74 | Ht 68.0 in | Wt 186.0 lb

## 2020-07-05 DIAGNOSIS — F209 Schizophrenia, unspecified: Secondary | ICD-10-CM

## 2020-07-05 NOTE — Progress Notes (Signed)
In as scheduled for his monthly injection of Haldol D. Today her received the 100 mg in his L deltoid. He is working today, and says work is going well. He offers no complaints. To return in one month for his next injection.

## 2020-08-02 ENCOUNTER — Ambulatory Visit (HOSPITAL_COMMUNITY): Payer: No Payment, Other | Admitting: *Deleted

## 2020-08-02 ENCOUNTER — Other Ambulatory Visit: Payer: Self-pay

## 2020-08-02 ENCOUNTER — Encounter (HOSPITAL_COMMUNITY): Payer: Self-pay

## 2020-08-02 VITALS — BP 125/69 | HR 73 | Ht 68.0 in | Wt 184.0 lb

## 2020-08-02 DIAGNOSIS — F209 Schizophrenia, unspecified: Secondary | ICD-10-CM

## 2020-08-02 NOTE — Progress Notes (Signed)
In as scheduled for his injection of Haldol D 100 mg. Today he got his shot in his R deltoid without difficulty. He was in waiting room with his eyes closed. States he didn't sleep well last HS but even when asked had no reason why. He denies any issues and offers no complaints. He is often quiet and offers little and answers only with words needed. He did smile when saying good bye to staff. To return in one month.

## 2020-08-30 ENCOUNTER — Encounter (HOSPITAL_COMMUNITY): Payer: Self-pay

## 2020-08-30 ENCOUNTER — Ambulatory Visit (INDEPENDENT_AMBULATORY_CARE_PROVIDER_SITE_OTHER): Payer: No Payment, Other | Admitting: *Deleted

## 2020-08-30 ENCOUNTER — Other Ambulatory Visit: Payer: Self-pay

## 2020-08-30 VITALS — BP 112/67 | HR 71 | Ht 68.0 in | Wt 182.6 lb

## 2020-08-30 DIAGNOSIS — F209 Schizophrenia, unspecified: Secondary | ICD-10-CM

## 2020-08-30 DIAGNOSIS — F200 Paranoid schizophrenia: Secondary | ICD-10-CM

## 2020-08-30 NOTE — Progress Notes (Signed)
In as scheduled for monthly injection of Haldol D. He denies any issues and offers no complaints. 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 Haldol D 100 mg given today in his L deltoid without difficulty. To return in one month for both a med check with provider and also for his monthly injection.

## 2020-09-01 ENCOUNTER — Telehealth: Payer: Self-pay | Admitting: General Practice

## 2020-09-01 NOTE — Telephone Encounter (Signed)
Steward Drone calling from [place], [place], and [employer] Fearing is calling to request Medical records for the patient. Please advise Cb- [phone] Fax- [phone]

## 2020-09-08 NOTE — Telephone Encounter (Signed)
Medical Records were released 3/11 and Steward Drone at firm confirmed that the received were received.

## 2020-09-13 ENCOUNTER — Encounter (HOSPITAL_COMMUNITY): Payer: Self-pay | Admitting: Psychiatry

## 2020-09-13 ENCOUNTER — Other Ambulatory Visit: Payer: Self-pay

## 2020-09-13 ENCOUNTER — Telehealth (INDEPENDENT_AMBULATORY_CARE_PROVIDER_SITE_OTHER): Payer: No Payment, Other | Admitting: Psychiatry

## 2020-09-13 DIAGNOSIS — F209 Schizophrenia, unspecified: Secondary | ICD-10-CM | POA: Diagnosis not present

## 2020-09-13 MED ORDER — HALOPERIDOL DECANOATE 100 MG/ML IM SOLN: 100.0000 mg | INTRAMUSCULAR | 10 refills | Status: DC

## 2020-09-13 NOTE — Progress Notes (Signed)
BH MD/PA/NP OP Progress Note Virtual Visit via Telephone Note  I connected with Peter Golden on 09/13/20 at 10:00 AM EDT by telephone and verified that I am speaking with the correct person using two identifiers.  Location: Patient: home Provider: Clinic   I discussed the limitations, risks, security and privacy concerns of performing an evaluation and management service by telephone and the availability of in person appointments. I also discussed with the patient that there may be a patient responsible charge related to this service. The patient expressed understanding and agreed to proceed.   I provided 30 minutes of non-face-to-face time during this encounter.   09/13/2020 10:37 AM Peter Golden  MRN:  [id number]  Chief Complaint: "I only take haldol injection. I stopped everything else."     HPI: 40 year old male seen today for follow-up psychiatric evaluation.  He has a psychiatric history of paranoid schizophrenia, schizoaffective disorder, and depression.  He is currently managed on Haldol 100 mg q. 28 days, Ativan 0.5 mg daily, and Cogentin 1 mg twice daily.  He informed Clinical research associate that he discontinued Ativan and Cogentin. He notes that Haldol is managing his psychiatric conditions well.   Today he was unable to login virtually so his exam was done over the phone. During assessment he was  pleasant, cooperative, and engaged in conversation. He informed provider that since his last visit he discontinued Ativan and Cogentin. He notes that the pharmacy did not fill it and notes that he felt that he did not need it. He informed provider that he his minimal anxiety and depression.  Provider conducted a GAD-7 and patient scored a 0, at his last visit he scored a 0.  Provider also conducted a PHQ-9 and patient scored a 7, at his last visit he scored a 4.  He denies SI/HI/VAH or paranoia.  At this time patient agreeable to discontinue cogentin and ativan.  He will continue all  other medications as prescribed.  No other concerns noted at this time.  Visit Diagnosis:    ICD-10-CM   1. Schizophrenia, unspecified type (HCC)  F20.9 haloperidol decanoate (HALDOL DECANOATE) 100 MG/ML injection    Past Psychiatric History: aranoid schizophrenia, schizoaffective disorder, and depression.  Past Medical History:  Past Medical History:  Diagnosis Date  . Schizo affective schizophrenia (HCC)   . Seizures (HCC)     Past Surgical History:  Procedure Laterality Date  . IR THORACENTESIS ASP PLEURAL SPACE W/IMG GUIDE  04/22/2018  . LAPAROTOMY N/A 04/15/2018   Procedure: EXPLORATORY LAPAROTOMY with splenectomy, transverse colectomy, partial gastrectomy, repair of diaphragmatic hernia, left 14 Fr pigtail tube thoracostomy, repair of umbilical hernia;  Surgeon: Almond Lint, MD;  Location: [facility];  Service: General;  Laterality: N/A;  . XI ROBOTIC ASSISTED COLOSTOMY TAKEDOWN N/A 03/31/2019   Procedure: ROBOTIC ASSISTED COLOSTOMY REVERSAL, SPLENIC FLEXURE TAKEDOWN;  Surgeon: Romie Levee, MD;  Location: [facility];  Service: General;  Laterality: N/A;    Family Psychiatric History: Denies  Family History:  Family History  Problem Relation Age of Onset  . Mental illness Neg Hx     Social History:  Social History   Socioeconomic History  . Marital status: Single    Spouse name: Not on file  . Number of children: Not on file  . Years of education: Not on file  . Highest education level: Not on file  Occupational History  . Not on file  Tobacco Use  . Smoking status: Never Smoker  . Smokeless tobacco: Never Used  Vaping Use  . Vaping Use: Never used  Substance and Sexual Activity  . Alcohol use: Not Currently    Comment: BAC was not available at time of assessment  . Drug use: Not Currently    Comment: UDS not available at time of assessment  . Sexual activity: Yes    Birth control/protection: None  Other Topics Concern  . Not on file  Social History Narrative    ** Merged History Encounter **       Social Determinants of Health   Financial Resource Strain: Not on file  Food Insecurity: Not on file  Transportation Needs: Not on file  Physical Activity: Not on file  Stress: Not on file  Social Connections: Not on file    Allergies: No Known Allergies  Metabolic Disorder Labs: Lab Results  Component Value Date   HGBA1C 5.2 12/15/2015   MPG 103 12/15/2015   MPG 111 07/13/2015   Lab Results  Component Value Date   PROLACTIN 26.7 (H) 12/15/2015   PROLACTIN 47.9 (H) 07/13/2015   Lab Results  Component Value Date   CHOL 261 (H) 12/15/2015   TRIG 63 04/15/2018   HDL 69 12/15/2015   CHOLHDL 3.8 12/15/2015   VLDL 13 12/15/2015   LDLCALC 179 (H) 12/15/2015   LDLCALC 181 (H) 07/13/2015   Lab Results  Component Value Date   TSH 0.557 07/18/2018   TSH 1.800 12/15/2015    Therapeutic Level Labs: No results found for: LITHIUM No results found for: VALPROATE No components found for:  CBMZ  Current Medications: Current Outpatient Medications  Medication Sig Dispense Refill  . benztropine (COGENTIN) 1 MG tablet Take 1 tablet (1 mg total) by mouth 2 (two) times daily. 60 tablet 2  . gabapentin (NEURONTIN) 300 MG capsule Take 1 capsule (300 mg total) by mouth 3 (three) times daily. (Patient not taking: Reported on 01/11/2020) 90 capsule 2  . haloperidol decanoate (HALDOL DECANOATE) 100 MG/ML injection Inject 1 mL (100 mg total) into the muscle every 30 (thirty) days. Next due 4/3 1 mL 10  . LORazepam (ATIVAN) 0.5 MG tablet Take 1 tablet (0.5 mg total) by mouth daily. 30 tablet 2  . Ostomy Supplies (CLOSED-END COLOSTOMY POUCH) MISC Use as directed 30 each 11   Current Facility-Administered Medications  Medication Dose Route Frequency Provider Last Rate Last Admin  . haloperidol decanoate (HALDOL DECANOATE) 50 MG/ML injection 100 mg  100 mg Intramuscular Q30 days Toy Cookey E, NP   100 mg at 08/30/20 1030      Musculoskeletal: Strength & Muscle Tone: Unable to assess due to telephone visit.  Gait & Station: Unable to assess due to telephone visit.  Patient leans: N/A  Psychiatric Specialty Exam: Review of Systems  There were no vitals taken for this visit.There is no height or weight on file to calculate BMI.  General Appearance: Unable to assess due to telephone visit.   Eye Contact:  Unable to assess due to telephone visit.   Speech:  Clear and Coherent and Normal Rate  Volume:  Decreased  Mood:  Euthymic  Affect:  Appropriate and Congruent  Thought Process:  Coherent, Goal Directed and Linear  Orientation:  Full (Time, Place, and Person)  Thought Content: WDL and Logical   Suicidal Thoughts:  No  Homicidal Thoughts:  No  Memory:  Immediate;   Good Recent;   Good Remote;   Good  Judgement:  Good  Insight:  Good  Psychomotor Activity:  Normal  Concentration:  Concentration:  Good and Attention Span: Good  Recall:  Good  Fund of Knowledge: Good  Language: Good  Akathisia:  No  Handed:  Right  AIMS (if indicated): Not done  Assets:  Communication Skills Desire for Improvement Financial Resources/Insurance Housing Social Support Talents/Skills Vocational/Educational  ADL's:  Intact  Cognition: WNL  Sleep:  Good   Screenings: AIMS   Flowsheet Row [facility] Ad[facility] (Discharged) [facility] Admission (Discharged) from OP Visit [facility] Admission (Discharged) [facility]  AIMS Total Score 0 0 0 0    AUDIT   Flowsheet Row Ad[facility]) [facility] Admission (Discharged) from OP Visit [facility] Admission (Discharged) from [facility]11[facility]2015  [facility]  Alcohol Use Disorder Identification Test Final Score (AUDIT) 0 0 0    GAD-7   Flowsheet Row Video [facility]13[facility]2020 [facility]  Total GAD-7 Score 0 0    PHQ2-9   Flowsheet Row Video Visit from 09/13/2020 in [facility] Clinical Support from 06/15/2020 in [facility]  PHQ-2 Total Score 1 0  PHQ-9 Total Score 7 4    Flowsheet Row ED from 01/13/2020 in [facility] (Discharged[facility]  C-SSRS RISK CATEGORY Error: Question 6 not populated No Risk       Assessment and Plan: Patient reports that he has been doing well on Haldol alone. At this time patient agreeable to discontinue cogentin and ativan.  He will continue all other medications as prescribed.   1. Schizophrenia, unspecified type (HCC)  Continue- haloperidol decanoate (HALDOL DECANOATE) 100 MG/ML injection; Inject 1 mL (100 mg total) into the muscle every 30 (thirty) days. Next due 4/3  Dispense: 1 mL; Refill: 10   Follow up in 3 months Shanna Cisco, NP 09/13/2020, 10:37 AM

## 2020-09-27 ENCOUNTER — Ambulatory Visit (HOSPITAL_COMMUNITY): Payer: No Payment, Other | Admitting: *Deleted

## 2020-09-27 ENCOUNTER — Other Ambulatory Visit: Payer: Self-pay

## 2020-09-27 ENCOUNTER — Encounter (HOSPITAL_COMMUNITY): Payer: Self-pay

## 2020-09-27 VITALS — BP 120/68 | HR 74 | Ht 68.0 in | Wt 187.0 lb

## 2020-09-27 DIAGNOSIS — F209 Schizophrenia, unspecified: Secondary | ICD-10-CM

## 2020-09-27 NOTE — Progress Notes (Signed)
In as scheduled for his monthly injection of haldol d 100 mg, given today in his R deltoid. He seems off his baseline today, but when asked if he had something to tell me, and if he was OK he said he was OK and had nothing to tell me. Seemed more remote today. He is dressed to go to work from here and said he is working. No complaints offered. To return in one month for his next shot.

## 2020-10-27 ENCOUNTER — Ambulatory Visit (INDEPENDENT_AMBULATORY_CARE_PROVIDER_SITE_OTHER): Payer: No Payment, Other | Admitting: *Deleted

## 2020-10-27 ENCOUNTER — Other Ambulatory Visit: Payer: Self-pay

## 2020-10-27 ENCOUNTER — Encounter (HOSPITAL_COMMUNITY): Payer: Self-pay

## 2020-10-27 VITALS — BP 127/74 | HR 69 | Ht 68.0 in | Wt 187.0 lb

## 2020-10-27 DIAGNOSIS — F209 Schizophrenia, unspecified: Secondary | ICD-10-CM | POA: Diagnosis not present

## 2020-10-27 NOTE — Progress Notes (Signed)
Patient arrived for injection Haldol Decanoate  100 mg  Patient pleasant. Heading back to work afterwards. Tolerated injection well in LEFT ARM. NO HI/SI NOR AH/VH

## 2020-11-24 ENCOUNTER — Encounter (HOSPITAL_COMMUNITY): Payer: Self-pay

## 2020-11-24 ENCOUNTER — Ambulatory Visit (HOSPITAL_COMMUNITY): Payer: No Payment, Other | Admitting: *Deleted

## 2020-11-24 ENCOUNTER — Other Ambulatory Visit: Payer: Self-pay

## 2020-11-24 VITALS — BP 116/67 | HR 65 | Resp 99 | Ht 68.0 in | Wt 186.0 lb

## 2020-11-24 DIAGNOSIS — F209 Schizophrenia, unspecified: Secondary | ICD-10-CM

## 2020-11-24 NOTE — Progress Notes (Signed)
In as scheduled for his monthly injection of Haldol D 100 mg. Today he got his injection in his R deltoid without difficulty. He is dressed for work and goes to work after his injection. He is at his baseline, flat, offers nothing but will respond appropriately to questions asked, but minimal words used in answering. He denies any problems when asked. No SI or HI or psychosis noted or reported. To return in 28-30 days for his next injection.

## 2020-12-12 ENCOUNTER — Ambulatory Visit (INDEPENDENT_AMBULATORY_CARE_PROVIDER_SITE_OTHER): Payer: No Payment, Other | Admitting: Psychiatry

## 2020-12-12 ENCOUNTER — Encounter (HOSPITAL_COMMUNITY): Payer: Self-pay | Admitting: Psychiatry

## 2020-12-12 DIAGNOSIS — F209 Schizophrenia, unspecified: Secondary | ICD-10-CM

## 2020-12-12 MED ORDER — HALOPERIDOL DECANOATE 100 MG/ML IM SOLN: 100.0000 mg | INTRAMUSCULAR | 10 refills | Status: DC

## 2020-12-12 NOTE — Progress Notes (Signed)
BH MD/PA/NP OP Progress Note    12/12/2020 10:29 AM Peter Golden  MRN:  [id number]  Chief Complaint: "I only take haldol injection. I stopped everything else."  Chief Complaint   Medication Management      HPI: 40 year old male seen today for follow-up psychiatric evaluation.  He has a psychiatric history of paranoid schizophrenia, schizoaffective disorder, and depression.  He is currently managed on Haldol 100 mg q. 28 days. He notes that Haldol is managing his psychiatric conditions.   Today, he is well-groomed, calm, cooperative, engages in conversation, and maintains good eye contact. Patient informed provider that since his last visit he has been doing well. 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 He continues to play soccer. He plans to return to school next semester to continue pursuing his finance degree. He will graduate next year and hopes to move to [place] afterwards.  Patient notes that he has minimal anxiety and depression. Provider conducted a GAD-7 and patient scored a 0, at his  last visit he scored a 0. Provider also conducted a PHQ-9 and patient scored a 2, at his last visit he scored a 7. Today, he denies SI/HI/VAH, mania, paranoia. He denies any noticeable side effect of his medication and denies abnormal movements. Provider conducted an aims assessment and patient scored a 0. He has a good appetite and is maintaining his weight. He sleeps 7-8 hours a night.  No medication changes made today.  Patient agreeable to continue medications prescribed.  Visit Diagnosis:    ICD-10-CM   1. Schizophrenia, unspecified type (HCC)  F20.9 haloperidol decanoate (HALDOL DECANOATE) 100 MG/ML injection      Past Psychiatric History: aranoid schizophrenia, schizoaffective disorder, and depression.  Past Medical History:  Past Medical History:  Diagnosis Date   Schizo affective schizophrenia (HCC)    Seizures (HCC)     Past  Surgical History:  Procedure Laterality Date   IR THORACENTESIS ASP PLEURAL SPACE W/IMG GUIDE  04/22/2018   LAPAROTOMY N/A 04/15/2018   Procedure: EXPLORATORY LAPAROTOMY with splenectomy, transverse colectomy, partial gastrectomy, repair of diaphragmatic hernia, left 14 Fr pigtail tube thoracostomy, repair of umbilical hernia;  Surgeon: Almond Lint, MD;  Location: [facility] OR;  Service: General;  Laterality: N/A;   XI ROBOTIC ASSISTED COLOSTOMY TAKEDOWN N/A 03/31/2019   Procedure: ROBOTIC ASSISTED COLOSTOMY REVERSAL, SPLENIC FLEXURE TAKEDOWN;  Surgeon: Romie Levee, MD;  Location: [facility];  Service: General;  Laterality: N/A;    Family Psychiatric History: Denies  Family History:  Family History  Problem Relation Age of Onset   Mental illness Neg Hx     Social History:  Social History   Socioeconomic History   Marital status: Single    Spouse name: Not on file   Number of children: Not on file   Years of education: Not on file   Highest education level: Not on file  Occupational History   Not on file  Tobacco Use   Smoking status: Never   Smokeless tobacco: Never  Vaping Use   Vaping Use: Never used  Substance and Sexual Activity   Alcohol use: Not Currently    Comment: BAC was not available at time of assessment   Drug use: Not Currently    Comment: UDS not available at time of assessment   Sexual activity: Yes    Birth control/protection: None  Other Topics Concern   Not on file  Social History Narrative   ** Merged  History Encounter **       Social Determinants of Health   Financial Resource Strain: Not on file  Food Insecurity: Not on file  Transportation Needs: Not on file  Physical Activity: Not on file  Stress: Not on file  Social Connections: Not on file    Allergies: No Known Allergies  Metabolic Disorder Labs: Lab Results  Component Value Date   HGBA1C 5.2 12/15/2015   MPG 103 12/15/2015   MPG 111 07/13/2015   Lab Results  Component Value Date    PROLACTIN 26.7 (H) 12/15/2015   PROLACTIN 47.9 (H) 07/13/2015   Lab Results  Component Value Date   CHOL 261 (H) 12/15/2015   TRIG 63 04/15/2018   HDL 69 12/15/2015   CHOLHDL 3.8 12/15/2015   VLDL 13 12/15/2015   LDLCALC 179 (H) 12/15/2015   LDLCALC 181 (H) 07/13/2015   Lab Results  Component Value Date   TSH 0.557 07/18/2018   TSH 1.800 12/15/2015    Therapeutic Level Labs: No results found for: LITHIUM No results found for: VALPROATE No components found for:  CBMZ  Current Medications: Current Outpatient Medications  Medication Sig Dispense Refill   haloperidol decanoate (HALDOL DECANOATE) 100 MG/ML injection Inject 1 mL (100 mg total) into the muscle every 30 (thirty) days. Next due 4/3 1 mL 10   Ostomy Supplies (CLOSED-END COLOSTOMY POUCH) MISC Use as directed 30 each 11   Current Facility-Administered Medications  Medication Dose Route Frequency Provider Last Rate Last Admin   haloperidol decanoate (HALDOL DECANOATE) 50 MG/ML injection 100 mg  100 mg Intramuscular Q30 days Toy Cookey E, NP   100 mg at 11/24/20 7341     Musculoskeletal: Strength & Muscle Tone: within normal limits Gait & Station: normal Patient leans: N/A  Psychiatric Specialty Exam: Review of Systems  Blood pressure 131/64, pulse 74, height 5\' 8"  (1.727 m), weight 186 lb (84.4 kg), SpO2 100 %.Body mass index is 28.28 kg/m.  General Appearance: Well Groomed  Eye Contact:  Good  Speech:  Clear and Coherent and Normal Rate  Volume:  Normal  Mood:  Euthymic  Affect:  Appropriate and Congruent  Thought Process:  Coherent, Goal Directed and Linear  Orientation:  Full (Time, Place, and Person)  Thought Content: WDL and Logical   Suicidal Thoughts:  No  Homicidal Thoughts:  No  Memory:  Immediate;   Good Recent;   Good Remote;   Good  Judgement:  Good  Insight:  Good  Psychomotor Activity:  Normal  Concentration:  Concentration: Good and Attention Span: Good  Recall:  Good  Fund  of Knowledge: Good  Language: Good  Akathisia:  No  Handed:  Right  AIMS (if indicated): Not done  Assets:  Communication Skills Desire for Improvement Financial Resources/Insurance Housing Social Support Talents/Skills Vocational/Educational  ADL's:  Intact  Cognition: WNL  Sleep:  Good   Screenings: AIMS    Flowsheet Row [facility]) [facility] Admission (Discharged) from OP [facility] Ad[facility] ([facility]) [facility]  AIMS Total Score 0 0 0 0      AUDIT    Flowsheet Row [facility]) [facility] Admission (Discharged) from OP [facility]  Alcohol Use Disorder Identification Test Final Score (AUDIT) 0 0 0      GAD-7    Flowsheet Row Clinical Support from 12/12/2020 in [facility] Video Visit from 09/13/2020 in [facility] Clinical Support from 06/15/2020 in [facility]  Total GAD-7 Score 0 0 0      PHQ2-9    Flowsheet Row Clinical Support from 12/12/2020 in [facility] Video Visit from 09/13/2020 in [facility] Clinical Support from 06/15/2020 in [facility]  PHQ-2 Total Score 0 1 0  PHQ-9 Total Score 2 7 4       Flowsheet Row ED from 01/13/2020 in [facility] Ad[facility]Discharged[facility]  C-SSRS RISK CATEGORY Error: Question 6 not populated No Risk         Assessment and Plan: Patient reports that he has been doing well on his current medication regimen.  No medication changes made today.  Patient agreeable to continue medications as prescribed.    1. Schizophrenia, unspecified type (HCC)  Continue- haloperidol decanoate (HALDOL DECANOATE) 100 MG/ML injection; Inject 1 mL (100 mg total) into the muscle every 30 (thirty) days. Next due 4/3  Dispense: 1 mL; Refill: 10   Follow up in 3 months 6/3, NP 12/12/2020, 10:29 AM

## 2020-12-22 ENCOUNTER — Ambulatory Visit (INDEPENDENT_AMBULATORY_CARE_PROVIDER_SITE_OTHER): Payer: No Payment, Other | Admitting: *Deleted

## 2020-12-22 ENCOUNTER — Encounter (HOSPITAL_COMMUNITY): Payer: Self-pay

## 2020-12-22 ENCOUNTER — Other Ambulatory Visit: Payer: Self-pay

## 2020-12-22 VITALS — BP 125/71 | HR 66 | Ht 68.0 in | Wt 188.0 lb

## 2020-12-22 DIAGNOSIS — F209 Schizophrenia, unspecified: Secondary | ICD-10-CM

## 2020-12-22 NOTE — Progress Notes (Signed)
Patient arrived today for haloperidol decanoate (HALDOL DECANOATE) 50 MG/ML injection 100 mg. Patient pleasant as always . Tolerated injection well in Left Arm. NO HI/SI NOR AH/VH.

## 2021-01-23 ENCOUNTER — Ambulatory Visit (HOSPITAL_COMMUNITY): Payer: No Payment, Other

## 2021-01-30 ENCOUNTER — Ambulatory Visit (INDEPENDENT_AMBULATORY_CARE_PROVIDER_SITE_OTHER): Payer: No Payment, Other | Admitting: *Deleted

## 2021-01-30 ENCOUNTER — Other Ambulatory Visit: Payer: Self-pay

## 2021-01-30 ENCOUNTER — Encounter (HOSPITAL_COMMUNITY): Payer: Self-pay

## 2021-01-30 VITALS — BP 113/58 | HR 60 | Ht 68.0 in | Wt 190.0 lb

## 2021-01-30 DIAGNOSIS — F209 Schizophrenia, unspecified: Secondary | ICD-10-CM | POA: Diagnosis not present

## 2021-01-30 NOTE — Progress Notes (Signed)
Patient arrived for his Injection haloperidol decanoate (HALDOL DECANOATE) 50 MG/ML injection 100 mg. Tolerated injection well in Right arm. Patient pleasant & talkative today. Groomed very nicely today. NO HI/SI NOR AH/VH.

## 2021-02-27 ENCOUNTER — Ambulatory Visit (HOSPITAL_COMMUNITY): Payer: No Payment, Other

## 2021-03-01 ENCOUNTER — Ambulatory Visit (HOSPITAL_COMMUNITY): Payer: No Payment, Other

## 2021-03-15 ENCOUNTER — Encounter (HOSPITAL_COMMUNITY): Payer: No Payment, Other | Admitting: Psychiatry

## 2021-03-30 ENCOUNTER — Telehealth (HOSPITAL_COMMUNITY): Payer: Self-pay | Admitting: *Deleted

## 2021-03-30 NOTE — Telephone Encounter (Signed)
Contacted him because he has not shown up for his monthly injection. I contacted him about two weeks ago, he said he would come and then didn't. Today it sounded like I had woken him up at 1050 when I called. 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 Agreed to come on tues for his shot around 11a.

## 2021-04-03 ENCOUNTER — Encounter (HOSPITAL_COMMUNITY): Payer: Self-pay | Admitting: Family

## 2021-04-03 ENCOUNTER — Ambulatory Visit (INDEPENDENT_AMBULATORY_CARE_PROVIDER_SITE_OTHER): Payer: No Payment, Other | Admitting: Family

## 2021-04-03 ENCOUNTER — Other Ambulatory Visit: Payer: Self-pay

## 2021-04-03 ENCOUNTER — Ambulatory Visit (HOSPITAL_COMMUNITY): Payer: No Payment, Other | Admitting: *Deleted

## 2021-04-03 VITALS — BP 113/78 | HR 76 | Ht 68.0 in | Wt 192.0 lb

## 2021-04-03 DIAGNOSIS — F209 Schizophrenia, unspecified: Secondary | ICD-10-CM

## 2021-04-03 DIAGNOSIS — F251 Schizoaffective disorder, depressive type: Secondary | ICD-10-CM

## 2021-04-03 NOTE — Progress Notes (Signed)
In as scheduled for his monthly injection. He is late for it and Clinical research associate called him last week to schedule his appt for today. He states he is working a new job. Today found out he is working at [employer] in [place]. He got a referral to them from a church friend and the friend takes him to work. He is working nights and states its going well. He seemed brighter and more spontaneous today than his baseline. He states he is gaining weight and discussed this with the NP today as he also needed to be seen by a provider for an assessment. Today, no changes made on his medicine because he did not want to trial oral meds. Also, he revealed to the NP he has been eating a lot of fast food because he doesn't have a wife. No other complaints offered. Injection of Haldol D 100 mg given in his L DELTOID without issue. He is to return in one month for his next injection.

## 2021-04-03 NOTE — Progress Notes (Signed)
BH MD/PA/NP OP Progress Note  04/03/2021 11:22 AM Peter Golden  MRN:  [id number]  Chief Complaint:  Chief Complaint   Injections   Schizophrenia, Long-acting injectable medication administration HPI:   Peter Golden "Peter Golden" is a 40 year old male seen today for follow-up psychiatric evaluation and long-acting injectable medication administration. Psychiatric history includes schizophrenia and schizoaffective disorder, depressive type.  He has been managed historically Haldol Decanoate. He reports feeling that his mood is managed effectively by LAI. Initially he verbalizes mild concern for weight gain, he is not interested in trial of alternative medication at this time. Discussed his recent increase in fast food intake related to his new employment opportunity on second shift. Reviewed healthful eating, he verbalizes understanding. Peter Golden is not currently followed by outpatient therapy/counseling, states " I don't have time for that, I work 40 hours per week."   Patient is assessed face-to-face by nurse practitioner, chart reviewed.  Today patient is seated, no acute distress. He is appropriately groomed. He is alert and oriented, pleasant and cooperative,  He has clear and coherent speech average volume.  Behavior calm and appropriate, with good eye contact.  Today provider conducted a GAD-7, patient scored a 0, last visit he scored a 0. Provider also conducted a PHQ-2, he scored a 0, at last visit he scored a 0. He denies suicidal and homicidal ideations currently.  He contracts verbally for safety with this Clinical research associate.     He denies both auditory and visual hallucinations.  There is no indication that he is responding to internal stimuli, no evidence of delusional thought content.  Patient is able to converse coherently with goal-directed thoughts and no distractibility or preoccupation. He denies paranoia.  Objectively there is no evidence of psychosis/mania or delusional thinking. Patient is  insightful regarding treatment and diagnosis.   Patient is tolerating medications with no adverse effects/reactions per his report.  He denies physical pain currently. He denies both alcohol and substance use.  Patient to receive monthly [facility]I Haldol Decanoate 100 mg monthly. Plan to follow-up in one month for Haldol Decanoate injection.    Patient provided support and encouragement.   Visit Diagnosis:    ICD-10-CM   1. Schizophrenia, unspecified type (HCC)  F20.9       Past Psychiatric History: Schizophrenia, schizoaffective disorder  Past Medical History:  Past Medical History:  Diagnosis Date   Schizo affective schizophrenia (HCC)    Seizures (HCC)     Past Surgical History:  Procedure Laterality Date   IR THORACENTESIS ASP PLEURAL SPACE W/IMG GUIDE  04/22/2018   LAPAROTOMY N/A 04/15/2018   Procedure: EXPLORATORY LAPAROTOMY with splenectomy, transverse colectomy, partial gastrectomy, repair of diaphragmatic hernia, left 14 Fr pigtail tube thoracostomy, repair of umbilical hernia;  Surgeon: Almond Lint, MD;  Location: [facility] OR;  Service: General;  Laterality: N/A;   XI ROBOTIC ASSISTED COLOSTOMY TAKEDOWN N/A 03/31/2019   Procedure: ROBOTIC ASSISTED COLOSTOMY REVERSAL, SPLENIC FLEXURE TAKEDOWN;  Surgeon: Romie Levee, MD;  Location: [facility];  Service: General;  Laterality: N/A;    Family Psychiatric History: none reported  Family History:  Family History  Problem Relation Age of Onset   Mental illness Neg Hx     Social History:  Social History   Socioeconomic History   Marital status: Single    Spouse name: Not on file   Number of children: Not on file   Years of education: Not on file   Highest education level: Not on file  Occupational History   Not on file  Tobacco Use   Smoking status: Never   Smokeless tobacco: Never  Vaping Use   Vaping Use: Never used  Substance and Sexual Activity   Alcohol use: Not Currently    Comment: BAC was not available at time of  assessment   Drug use: Not Currently    Comment: UDS not available at time of assessment   Sexual activity: Yes    Birth control/protection: None  Other Topics Concern   Not on file  Social History Narrative   ** Merged History Encounter **       Social Determinants of Health   Financial Resource Strain: Not on file  Food Insecurity: Not on file  Transportation Needs: Not on file  Physical Activity: Not on file  Stress: Not on file  Social Connections: Not on file    Allergies: No Known Allergies  Metabolic Disorder Labs: Lab Results  Component Value Date   HGBA1C 5.2 12/15/2015   MPG 103 12/15/2015   MPG 111 07/13/2015   Lab Results  Component Value Date   PROLACTIN 26.7 (H) 12/15/2015   PROLACTIN 47.9 (H) 07/13/2015   Lab Results  Component Value Date   CHOL 261 (H) 12/15/2015   TRIG 63 04/15/2018   HDL 69 12/15/2015   CHOLHDL 3.8 12/15/2015   VLDL 13 12/15/2015   LDLCALC 179 (H) 12/15/2015   LDLCALC 181 (H) 07/13/2015   Lab Results  Component Value Date   TSH 0.557 07/18/2018   TSH 1.800 12/15/2015    Therapeutic Level Labs: No results found for: LITHIUM No results found for: VALPROATE No components found for:  CBMZ  Current Medications: Current Outpatient Medications  Medication Sig Dispense Refill   haloperidol decanoate (HALDOL DECANOATE) 100 MG/ML injection Inject 1 mL (100 mg total) into the muscle every 30 (thirty) days. Next due 4/3 1 mL 10   Ostomy Supplies (CLOSED-END COLOSTOMY POUCH) MISC Use as directed 30 each 11   Current Facility-Administered Medications  Medication Dose Route Frequency Provider Last Rate Last Admin   haloperidol decanoate (HALDOL DECANOATE) 50 MG/ML injection 100 mg  100 mg Intramuscular Q30 days Toy Cookey E, NP   100 mg at 01/30/21 0930     Musculoskeletal: Strength & Muscle Tone: within normal limits Gait & Station: normal Patient leans: N/A  Psychiatric Specialty Exam: Review of Systems  Blood  pressure 113/78, pulse 76, height 5\' 8"  (1.727 m), weight 192 lb (87.1 kg), SpO2 100 %.Body mass index is 29.19 kg/m.  General Appearance: Casual and Fairly Groomed  Eye Contact:  Good  Speech:  Clear and Coherent and Normal Rate  Volume:  Normal  Mood:  Euthymic  Affect:  Appropriate and Congruent  Thought Process:  Coherent, Goal Directed, and Linear  Orientation:  Full (Time, Place, and Person)  Thought Content: WDL and Logical   Suicidal Thoughts:  No  Homicidal Thoughts:  No  Memory:  Immediate;   Good Recent;   Good Remote;   Good  Judgement:  Good  Insight:  Good  Psychomotor Activity:  Normal  Concentration:  Concentration: Good and Attention Span: Good  Recall:  Good  Fund of Knowledge: Good  Language: Good  Akathisia:  No  Handed:  Right  AIMS (if indicated): not done  Assets:  Communication Skills Desire for Improvement Financial Resources/Insurance Housing Intimacy Leisure Time Physical Health Resilience  ADL's:  Intact  Cognition: WNL  Sleep:  Good   Screenings: AIMS    Flowsheet Row [facility] (Dis[facility]) [facility] Admission (Discharged) from OP Visit [facility] Ad[facility] (Dis[facility]) [facility]  AIMS Total Score 0 0 0 0      AUDIT    Flowsheet Row Ad[facility]) [facility] Admission (Discharged) from OP Visit [facility] Ad[facility] (Discharged[facility]  Alcohol Use Disorder Identification Test Final Score (AUDIT) 0 0 0      GAD-7    Flowsheet Row Clinical Support from 12/12/2020 in [facility] Video Visit from  09/13/2020 in [facility] Clinical Support from 06/15/2020 in [facility]  Total GAD-7 Score 0 0 0      PHQ2-9    Flowsheet Row Office Visit from 04/03/2021 in [facility] Clinical Support from 12/12/2020 in [facility] Video Visit from 09/13/2020 in [facility] Clinical Support from 06/15/2020 in [facility]  PHQ-2 Total Score 0 0 1 0  PHQ-9 Total Score -- 2 7 4       Flowsheet Row Office Visit from 04/03/2021 in [facility] ED from 01/13/2020 in [facility] Ad[facility] (Discharged[facility]  C-SSRS RISK CATEGORY No Risk Error: Question 6 not populated No Risk        Assessment and Plan: Patient to receive monthly [facility] Haldol Decanoate 100 mg monthly. Plan to follow-up in one month for Haldol Decanoate injection. Laboratory studies ordered for collection at next visit in approximately one month including CBC, CMP, lipid panel, prolactin, A1C.    11/02/2018, FNP 04/03/2021, 11:22 AM

## 2021-05-01 ENCOUNTER — Ambulatory Visit (HOSPITAL_COMMUNITY): Payer: No Payment, Other

## 2021-07-10 ENCOUNTER — Encounter (HOSPITAL_COMMUNITY): Payer: Self-pay | Admitting: Family

## 2021-07-10 ENCOUNTER — Other Ambulatory Visit: Payer: Self-pay

## 2021-07-10 ENCOUNTER — Ambulatory Visit (INDEPENDENT_AMBULATORY_CARE_PROVIDER_SITE_OTHER): Payer: No Payment, Other | Admitting: *Deleted

## 2021-07-10 ENCOUNTER — Ambulatory Visit (INDEPENDENT_AMBULATORY_CARE_PROVIDER_SITE_OTHER): Payer: No Payment, Other | Admitting: Family

## 2021-07-10 VITALS — BP 121/80 | HR 62 | Ht 68.0 in | Wt 196.0 lb

## 2021-07-10 DIAGNOSIS — F251 Schizoaffective disorder, depressive type: Secondary | ICD-10-CM

## 2021-07-10 DIAGNOSIS — F209 Schizophrenia, unspecified: Secondary | ICD-10-CM

## 2021-07-10 MED ORDER — HALOPERIDOL DECANOATE 100 MG/ML IM SOLN
100.0000 mg | INTRAMUSCULAR | Status: AC
Start: 2021-08-10 — End: 2021-11-07

## 2021-07-10 NOTE — Progress Notes (Signed)
PATIENT ARRIVED FOR INJECTION haloperidol decanoate (HALDOL DECANOATE) 50 MG/ML injection 100 mg AFTER MONTHS OF BEGIN NOT SEEN BY PROVIDERS OR COMING FOR  INJECTION.  PLEASANT AS ALWAYS & PASSING OUT HAPPY NEW YEAR HUGS (LOL)

## 2021-07-10 NOTE — Progress Notes (Signed)
BH MD/PA/NP OP Progress Note  07/10/2021 11:22 AM Peter Golden  MRN:  [id number]  Chief Complaint:  HPI: Patient states "sometimes when I sleep I see my family in [place], it is like a dream." Patient denies both auditory and visual hallucinations.    Peter Golden resides in [place] with a friend, who is also originally from [place]. He denies access to weapons. He enjoys speaking with his girlfriend in [place] on the telephone. He is planning to marry his girlfriend moving forward, possibly be the end of this calendar year. He is currently working 45-50 hours per week in [employer].  Recent stressors include,  little time to prepare meals related to working evening shift. He states "I don't cook much, I buy food in restaurants, like [employer]. I think I may be gaining weight." Patient is insightful, verbalizes plan to stop consuming soda in a effort to eat more healthfully.   Peter Golden is a 41 year old male seen today for follow-up psychiatric evaluation. Psychiatric history includes schizoaffective disorder and schizophrenia as well as, major depressive disorder.    He has been managed historically Haldol decanoate 100mg  IM. ,  He reports feeling that his mood is managed effectively by LAI. He is not followed by outpatient therapy, feels he does require therapy and he does not have time to follow up with outpatient therapy.   Patient is assessed face-to-face by nurse practitioner, chart reviewed.    Today patient is seated, no acute distress. He is appropriately groomed. He is alert and oriented, pleasant and cooperative.Peter Golden has clear and coherent speech average volume.  Behavior calm and appropriate, with good eye contact.  Mood today appears euthymic, congruent affect.    Today provider conducted a PHQ-2, patient scored a 0, at last visit, on 04/03/2021,  he also scored 0.   He easily contracts verbally for safety with this Clinical research associatewriter.    He denies both auditory and visual  hallucinations.  There is no indication that he is responding to internal stimuli, no evidence of delusional thought content.  Patient is able to converse coherently with goal-directed thoughts and no distractibility or preoccupation. He denies paranoia.  Objectively there is no evidence of psychosis/mania or delusional thinking.  Patient is insightful regarding treatment and diagnosis.   Patient is tolerating medications with no adverse effects/reactions per his report.  Patient reports average sleep and appetite. No nightmares reported.   He denies physical pain.  He denies both alcohol and substance use.    Patient provided support and encouragement.  Patient to receive monthly [facility] Haldol Decanoate 100mg . Plan to follow-up in one month for Haldol Decanoate 100 mg IM [facility]. Today's Vitals   07/10/21 1047  PainSc: 0-No pain   There is no height or weight on file to calculate BMI.    Visit Diagnosis: No diagnosis found.  Past Psychiatric History: schizophrenia, schizoaffective disorder, MDD  Past Medical History:  Past Medical History:  Diagnosis Date   Schizo affective schizophrenia (HCC)    Seizures (HCC)     Past Surgical History:  Procedure Laterality Date   IR THORACENTESIS ASP PLEURAL SPACE W/IMG GUIDE  04/22/2018   LAPAROTOMY N/A 04/15/2018   Procedure: EXPLORATORY LAPAROTOMY with splenectomy, transverse colectomy, partial gastrectomy, repair of diaphragmatic hernia, left 14 Fr pigtail tube thoracostomy, repair of umbilical hernia;  Surgeon: Almond LintByerly, Faera, MD;  [facility];  Service: General;  Laterality: N/A;   XI ROBOTIC ASSISTED COLOSTOMY TAKEDOWN N/A 03/31/2019   Procedure: ROBOTIC ASSISTED COLOSTOMY REVERSAL, SPLENIC FLEXURE TAKEDOWN;  Surgeon: Romie Levee, MD;  Location: [facility]S;  Service: General;  Laterality: N/A;    Family Psychiatric History: none reported  Family History:  Family History  Problem Relation Age of Onset   Mental illness Neg Hx     Social  History:  Social History   Socioeconomic History   Marital status: Single    Spouse name: Not on file   Number of children: Not on file   Years of education: Not on file   Highest education level: Not on file  Occupational History   Not on file  Tobacco Use   Smoking status: Never   Smokeless tobacco: Never  Vaping Use   Vaping Use: Never used  Substance and Sexual Activity   Alcohol use: Not Currently    Comment: BAC was not available at time of assessment   Drug use: Not Currently    Comment: UDS not available at time of assessment   Sexual activity: Yes    Birth control/protection: None  Other Topics Concern   Not on file  Social History Narrative   ** Merged History Encounter **       Social Determinants of Health   Financial Resource Strain: Not on file  Food Insecurity: Not on file  Transportation Needs: Not on file  Physical Activity: Not on file  Stress: Not on file  Social Connections: Not on file    Allergies: No Known Allergies  Metabolic Disorder Labs: Lab Results  Component Value Date   HGBA1C 5.2 12/15/2015   MPG 103 12/15/2015   MPG 111 07/13/2015   Lab Results  Component Value Date   PROLACTIN 26.7 (H) 12/15/2015   PROLACTIN 47.9 (H) 07/13/2015   Lab Results  Component Value Date   CHOL 261 (H) 12/15/2015   TRIG 63 04/15/2018   HDL 69 12/15/2015   CHOLHDL 3.8 12/15/2015   VLDL 13 12/15/2015   LDLCALC 179 (H) 12/15/2015   LDLCALC 181 (H) 07/13/2015   Lab Results  Component Value Date   TSH 0.557 07/18/2018   TSH 1.800 12/15/2015    Therapeutic Level Labs: No results found for: LITHIUM No results found for: VALPROATE No components found for:  CBMZ  Current Medications: Current Outpatient Medications  Medication Sig Dispense Refill   haloperidol decanoate (HALDOL DECANOATE) 100 MG/ML injection Inject 1 mL (100 mg total) into the muscle every 30 (thirty) days. Next due 4/3 1 mL 10   Ostomy Supplies (CLOSED-END COLOSTOMY POUCH)  MISC Use as directed 30 each 11   Current Facility-Administered Medications  Medication Dose Route Frequency Provider Last Rate Last Admin   haloperidol decanoate (HALDOL DECANOATE) 50 MG/ML injection 100 mg  100 mg Intramuscular Q30 days Toy Cookey E, NP   100 mg at 07/10/21 1101     Musculoskeletal: Strength & Muscle Tone: within normal limits Gait & Station: normal Patient leans: N/A  Psychiatric Specialty Exam: Review of Systems  Constitutional: Negative.   HENT: Negative.    Eyes: Negative.   Respiratory: Negative.    Cardiovascular: Negative.   Gastrointestinal: Negative.   Genitourinary: Negative.   Musculoskeletal: Negative.   Skin: Negative.   Neurological: Negative.   Hematological: Negative.   Psychiatric/Behavioral: Negative.     There were no vitals taken for this visit.There is no height or weight on file to calculate BMI.  General Appearance: Casual and Fairly Groomed  Eye Contact:  Good  Speech:  Clear and Coherent and Normal Rate  Volume:  Normal  Mood:  Euthymic  Affect:  Appropriate and Congruent  Thought Process:  Coherent, Goal Directed, and Linear  Orientation:  Full (Time, Place, and Person)  Thought Content: WDL and Logical   Suicidal Thoughts:  No  Homicidal Thoughts:  No  Memory:  Immediate;   Good Recent;   Good Remote;   Good  Judgement:  Good  Insight:  Good  Psychomotor Activity:  Normal  Concentration:  Concentration: Good and Attention Span: Good  Recall:  Good  Fund of Knowledge: Good  Language: Good  Akathisia:  No  Handed:  Right  AIMS (if indicated): done  Assets:  Communication Skills Desire for Improvement Housing Intimacy Leisure Time Physical Health Resilience Social Support  ADL's:  Intact  Cognition: WNL  Sleep:  Good   Screenings: AIMS    Flowsheet Row [facility] Ad[facility] (Discharged[facility] from [facility]) [facility]  AIMS Total Score 0 0 0 0      AUDIT    Flowsheet Row [facility]) [facility] Ad[facility] (Discharged) from OP [facility]) [facility]  Alcohol Use Disorder Identification Test Final Score (AUDIT) 0 0 0      GAD-7    Flowsheet Row Office Visit from 04/03/2021 in [facility] Clinical Support from 12/12/2020 in [facility] Video Visit from 09/13/2020 in [facility] Clinical Support from 06/15/2020 in [facility]  Total GAD-7 Score 0 0 0 0      PHQ2-9    Flowsheet Row Office Visit from 07/10/2021 in [facility] Office Visit from 04/03/2021 in [facility] Clinical Support from 12/12/2020 in [facility] Video Visit from 09/13/2020 in [facility] Clinical Support from 06/15/2020 in [facility]  PHQ-2 Total Score 0 0 0 1 0  PHQ-9 Total Score -- -- 2 7 4       Flowsheet Row Office Visit from 07/10/2021 in [facility] Office Visit from 04/03/2021 in [facility] ED from 01/13/2020 in [facility]  C-SSRS RISK CATEGORY No Risk No Risk Error: Question 6 not populated        Assessment and Plan: Patient reviewed with Dr [facility].Patient to receive monthly [facility] Haldol Decanoate 100mg . Plan to follow-up in one month for Haldol Decanoate 100 mg IM [facility].    Nelly Rout, FNP 07/10/2021, 11:22 AM

## 2021-08-07 ENCOUNTER — Encounter (HOSPITAL_COMMUNITY): Payer: Self-pay

## 2021-08-07 ENCOUNTER — Other Ambulatory Visit: Payer: Self-pay

## 2021-08-07 ENCOUNTER — Ambulatory Visit (INDEPENDENT_AMBULATORY_CARE_PROVIDER_SITE_OTHER): Payer: No Payment, Other | Admitting: *Deleted

## 2021-08-07 DIAGNOSIS — F209 Schizophrenia, unspecified: Secondary | ICD-10-CM

## 2021-08-07 NOTE — Progress Notes (Signed)
PATIENT ARRIVED FOR haloperidol decanoate (HALDOL DECANOATE) 100 MG/ML injection. TOLERATED INJECTION WELL IN Left-Arm. PATIENT MENTIONED HIS WORK SHIFT HAS CHANGED . HE NOW WORKS FROM 2 PM -11 PM.

## 2021-09-04 ENCOUNTER — Encounter (HOSPITAL_COMMUNITY): Payer: Self-pay

## 2021-09-04 ENCOUNTER — Ambulatory Visit (INDEPENDENT_AMBULATORY_CARE_PROVIDER_SITE_OTHER): Payer: No Payment, Other | Admitting: *Deleted

## 2021-09-04 ENCOUNTER — Other Ambulatory Visit: Payer: Self-pay

## 2021-09-04 VITALS — BP 123/84 | HR 62 | Ht 68.0 in | Wt 193.0 lb

## 2021-09-04 DIAGNOSIS — F209 Schizophrenia, unspecified: Secondary | ICD-10-CM

## 2021-09-04 NOTE — Progress Notes (Signed)
Patient arrived for haloperidol decanoate (HALDOL DECANOATE) 50 MG/ML injection 100 mg  ?Patient pleasant as always , still enjoying his job & excited about upcoming wedding. ?Tolerated Injection Well in Right-Arm ?

## 2021-10-02 ENCOUNTER — Ambulatory Visit (HOSPITAL_COMMUNITY): Payer: No Payment, Other

## 2023-03-20 DIAGNOSIS — Z7689 Persons encountering health services in other specified circumstances: Secondary | ICD-10-CM | POA: Diagnosis not present

## 2023-03-20 DIAGNOSIS — Z1159 Encounter for screening for other viral diseases: Secondary | ICD-10-CM | POA: Diagnosis not present

## 2023-03-20 DIAGNOSIS — Z114 Encounter for screening for human immunodeficiency virus [HIV]: Secondary | ICD-10-CM | POA: Diagnosis not present

## 2023-03-20 DIAGNOSIS — Z125 Encounter for screening for malignant neoplasm of prostate: Secondary | ICD-10-CM | POA: Diagnosis not present

## 2023-03-20 DIAGNOSIS — R350 Frequency of micturition: Secondary | ICD-10-CM | POA: Diagnosis not present

## 2023-03-20 DIAGNOSIS — Z131 Encounter for screening for diabetes mellitus: Secondary | ICD-10-CM | POA: Diagnosis not present

## 2023-03-20 DIAGNOSIS — N528 Other male erectile dysfunction: Secondary | ICD-10-CM | POA: Diagnosis not present

## 2023-03-20 DIAGNOSIS — Z1321 Encounter for screening for nutritional disorder: Secondary | ICD-10-CM | POA: Diagnosis not present

## 2023-03-20 DIAGNOSIS — Z13220 Encounter for screening for lipoid disorders: Secondary | ICD-10-CM | POA: Diagnosis not present

## 2023-03-24 DIAGNOSIS — R3129 Other microscopic hematuria: Secondary | ICD-10-CM | POA: Diagnosis not present

## 2023-03-24 DIAGNOSIS — Z0001 Encounter for general adult medical examination with abnormal findings: Secondary | ICD-10-CM | POA: Diagnosis not present

## 2023-03-24 DIAGNOSIS — Z136 Encounter for screening for cardiovascular disorders: Secondary | ICD-10-CM | POA: Diagnosis not present

## 2023-03-24 DIAGNOSIS — R300 Dysuria: Secondary | ICD-10-CM | POA: Diagnosis not present

## 2023-03-24 DIAGNOSIS — Z113 Encounter for screening for infections with a predominantly sexual mode of transmission: Secondary | ICD-10-CM | POA: Diagnosis not present

## 2023-03-24 DIAGNOSIS — E663 Overweight: Secondary | ICD-10-CM | POA: Diagnosis not present

## 2023-03-24 DIAGNOSIS — Z131 Encounter for screening for diabetes mellitus: Secondary | ICD-10-CM | POA: Diagnosis not present

## 2023-03-24 DIAGNOSIS — Z23 Encounter for immunization: Secondary | ICD-10-CM | POA: Diagnosis not present

## 2023-03-25 DIAGNOSIS — Z113 Encounter for screening for infections with a predominantly sexual mode of transmission: Secondary | ICD-10-CM | POA: Diagnosis not present

## 2023-03-25 DIAGNOSIS — Z0001 Encounter for general adult medical examination with abnormal findings: Secondary | ICD-10-CM | POA: Diagnosis not present

## 2023-03-25 DIAGNOSIS — Z136 Encounter for screening for cardiovascular disorders: Secondary | ICD-10-CM | POA: Diagnosis not present

## 2023-03-25 DIAGNOSIS — Z131 Encounter for screening for diabetes mellitus: Secondary | ICD-10-CM | POA: Diagnosis not present

## 2023-04-03 DIAGNOSIS — E782 Mixed hyperlipidemia: Secondary | ICD-10-CM | POA: Diagnosis not present

## 2023-04-03 DIAGNOSIS — E789 Disorder of lipoprotein metabolism, unspecified: Secondary | ICD-10-CM | POA: Diagnosis not present

## 2023-05-21 DIAGNOSIS — R300 Dysuria: Secondary | ICD-10-CM | POA: Diagnosis not present

## 2023-05-21 DIAGNOSIS — R3129 Other microscopic hematuria: Secondary | ICD-10-CM | POA: Diagnosis not present

## 2023-05-21 DIAGNOSIS — R399 Unspecified symptoms and signs involving the genitourinary system: Secondary | ICD-10-CM | POA: Diagnosis not present

## 2023-05-21 DIAGNOSIS — E663 Overweight: Secondary | ICD-10-CM | POA: Diagnosis not present

## 2023-05-21 DIAGNOSIS — E7849 Other hyperlipidemia: Secondary | ICD-10-CM | POA: Diagnosis not present

## 2023-06-30 DIAGNOSIS — R350 Frequency of micturition: Secondary | ICD-10-CM | POA: Diagnosis not present

## 2023-06-30 DIAGNOSIS — R7309 Other abnormal glucose: Secondary | ICD-10-CM | POA: Diagnosis not present

## 2023-06-30 DIAGNOSIS — Z3141 Encounter for fertility testing: Secondary | ICD-10-CM | POA: Diagnosis not present

## 2023-06-30 DIAGNOSIS — R0683 Snoring: Secondary | ICD-10-CM | POA: Diagnosis not present

## 2023-07-14 DIAGNOSIS — E291 Testicular hypofunction: Secondary | ICD-10-CM | POA: Diagnosis not present

## 2023-08-11 DIAGNOSIS — N469 Male infertility, unspecified: Secondary | ICD-10-CM | POA: Diagnosis not present

## 2023-09-16 DIAGNOSIS — E559 Vitamin D deficiency, unspecified: Secondary | ICD-10-CM | POA: Diagnosis not present

## 2023-09-16 DIAGNOSIS — Z0001 Encounter for general adult medical examination with abnormal findings: Secondary | ICD-10-CM | POA: Diagnosis not present

## 2023-09-16 DIAGNOSIS — R7303 Prediabetes: Secondary | ICD-10-CM | POA: Diagnosis not present

## 2023-09-16 DIAGNOSIS — E7849 Other hyperlipidemia: Secondary | ICD-10-CM | POA: Diagnosis not present

## 2024-02-11 DIAGNOSIS — R4182 Altered mental status, unspecified: Secondary | ICD-10-CM | POA: Diagnosis not present

## 2024-02-11 DIAGNOSIS — R519 Headache, unspecified: Secondary | ICD-10-CM | POA: Diagnosis not present

## 2024-02-11 NOTE — ED Triage Notes (Signed)
 Pt presents to ED via EMS from work for reports of altered mental status. Staff reported pt had been acting strange today and tonight started having periods that he would stare off and not respond. Reports on EMS arrival, pt was Aox4 however would not speak during transport.

## 2024-02-12 ENCOUNTER — Other Ambulatory Visit: Payer: Self-pay

## 2024-02-12 ENCOUNTER — Emergency Department (HOSPITAL_COMMUNITY)

## 2024-02-12 ENCOUNTER — Emergency Department (HOSPITAL_COMMUNITY)
Admission: EM | Admit: 2024-02-12 | Discharge: 2024-02-13 | Disposition: A | Discharge status: Home / Self Care | Attending: Emergency Medicine | Admitting: Emergency Medicine

## 2024-02-12 ENCOUNTER — Encounter (HOSPITAL_COMMUNITY): Payer: Self-pay

## 2024-02-12 DIAGNOSIS — F29 Unspecified psychosis not due to a substance or known physiological condition: Secondary | ICD-10-CM | POA: Diagnosis not present

## 2024-02-12 DIAGNOSIS — Y900 Blood alcohol level of less than 20 mg/100 ml: Secondary | ICD-10-CM | POA: Insufficient documentation

## 2024-02-12 DIAGNOSIS — R220 Localized swelling, mass and lump, head: Secondary | ICD-10-CM | POA: Diagnosis not present

## 2024-02-12 DIAGNOSIS — F329 Major depressive disorder, single episode, unspecified: Secondary | ICD-10-CM | POA: Insufficient documentation

## 2024-02-12 DIAGNOSIS — R451 Restlessness and agitation: Secondary | ICD-10-CM | POA: Diagnosis not present

## 2024-02-12 DIAGNOSIS — F1310 Sedative, hypnotic or anxiolytic abuse, uncomplicated: Secondary | ICD-10-CM | POA: Diagnosis not present

## 2024-02-12 DIAGNOSIS — R519 Headache, unspecified: Secondary | ICD-10-CM | POA: Diagnosis not present

## 2024-02-12 DIAGNOSIS — R410 Disorientation, unspecified: Secondary | ICD-10-CM | POA: Diagnosis not present

## 2024-02-12 DIAGNOSIS — R4182 Altered mental status, unspecified: Secondary | ICD-10-CM | POA: Insufficient documentation

## 2024-02-12 DIAGNOSIS — S0990XA Unspecified injury of head, initial encounter: Secondary | ICD-10-CM | POA: Diagnosis not present

## 2024-02-12 DIAGNOSIS — R55 Syncope and collapse: Secondary | ICD-10-CM | POA: Diagnosis not present

## 2024-02-12 DIAGNOSIS — F209 Schizophrenia, unspecified: Secondary | ICD-10-CM | POA: Diagnosis present

## 2024-02-12 DIAGNOSIS — S0993XA Unspecified injury of face, initial encounter: Secondary | ICD-10-CM | POA: Diagnosis not present

## 2024-02-12 DIAGNOSIS — R404 Transient alteration of awareness: Secondary | ICD-10-CM | POA: Diagnosis not present

## 2024-02-12 DIAGNOSIS — S199XXA Unspecified injury of neck, initial encounter: Secondary | ICD-10-CM | POA: Diagnosis not present

## 2024-02-12 LAB — CBC WITH DIFFERENTIAL/PLATELET
Abs Immature Granulocytes: 0.03 K/uL (ref 0.00–0.07)
Basophils Absolute: 0 K/uL (ref 0.0–0.1)
Basophils Relative: 0 %
Eosinophils Absolute: 0 K/uL (ref 0.0–0.5)
Eosinophils Relative: 0 %
HCT: 48.2 % (ref 39.0–52.0)
Hemoglobin: 16.6 g/dL (ref 13.0–17.0)
Immature Granulocytes: 0 %
Lymphocytes Relative: 22 %
Lymphs Abs: 3 K/uL (ref 0.7–4.0)
MCH: 31.7 pg (ref 26.0–34.0)
MCHC: 34.4 g/dL (ref 30.0–36.0)
MCV: 92.2 fL (ref 80.0–100.0)
Monocytes Absolute: 1.5 K/uL — ABNORMAL HIGH (ref 0.1–1.0)
Monocytes Relative: 11 %
Neutro Abs: 9 K/uL — ABNORMAL HIGH (ref 1.7–7.7)
Neutrophils Relative %: 67 %
Platelets: 233 K/uL (ref 150–400)
RBC: 5.23 MIL/uL (ref 4.22–5.81)
RDW: 14.1 % (ref 11.5–15.5)
WBC: 13.5 K/uL — ABNORMAL HIGH (ref 4.0–10.5)
nRBC: 0 % (ref 0.0–0.2)

## 2024-02-12 LAB — COMPREHENSIVE METABOLIC PANEL WITH GFR
ALT: 19 U/L (ref 0–44)
AST: 41 U/L (ref 15–41)
Albumin: 4.8 g/dL (ref 3.5–5.0)
Alkaline Phosphatase: 49 U/L (ref 38–126)
Anion gap: 11 (ref 5–15)
BUN: 8 mg/dL (ref 6–20)
CO2: 25 mmol/L (ref 22–32)
Calcium: 10.1 mg/dL (ref 8.9–10.3)
Chloride: 102 mmol/L (ref 98–111)
Creatinine, Ser: 1.17 mg/dL (ref 0.61–1.24)
GFR, Estimated: >60 mL/min (ref >60)
Glucose, Bld: 103 mg/dL — ABNORMAL HIGH (ref 70–99)
Potassium: 3.4 mmol/L — ABNORMAL LOW (ref 3.5–5.1)
Sodium: 138 mmol/L (ref 135–145)
Total Bilirubin: 4.4 mg/dL — ABNORMAL HIGH (ref 0.0–1.2)
Total Protein: 8.1 g/dL (ref 6.5–8.1)

## 2024-02-12 LAB — ETHANOL: Alcohol, Ethyl (B): <15 mg/dL (ref <15)

## 2024-02-12 LAB — ACETAMINOPHEN LEVEL: Acetaminophen (Tylenol), Serum: <10 ug/mL — ABNORMAL LOW (ref 10–30)

## 2024-02-12 LAB — SALICYLATE LEVEL: Salicylate Lvl: <7 mg/dL — ABNORMAL LOW (ref 7.0–30.0)

## 2024-02-12 MED ORDER — ZIPRASIDONE MESYLATE 20 MG IM SOLR: 20.0000 mg | Freq: Two times a day (BID) | INTRAMUSCULAR | Status: DC | PRN

## 2024-02-12 MED ORDER — HALOPERIDOL LACTATE 5 MG/ML IJ SOLN
10.0000 mg | Freq: Once | INTRAMUSCULAR | Status: AC
  Administered 2024-02-12: 10 mg via INTRAVENOUS
  Filled 2024-02-12: qty 2

## 2024-02-12 MED ORDER — ACETAMINOPHEN 325 MG PO TABS: 650.0000 mg | ORAL_TABLET | ORAL | Status: DC | PRN

## 2024-02-12 MED ORDER — LORAZEPAM 1 MG PO TABS: 1.0000 mg | ORAL_TABLET | ORAL | Status: DC | PRN

## 2024-02-12 MED ORDER — MIDAZOLAM HCL 2 MG/2ML IJ SOLN
2.0000 mg | Freq: Once | INTRAMUSCULAR | Status: AC
  Administered 2024-02-12: 2 mg via INTRAMUSCULAR
  Filled 2024-02-12: qty 2

## 2024-02-12 MED ORDER — ONDANSETRON HCL 4 MG PO TABS
4.0000 mg | ORAL_TABLET | Freq: Three times a day (TID) | ORAL | Status: DC | PRN
Start: 2024-02-12 — End: 2024-02-13

## 2024-02-12 NOTE — ED Notes (Signed)
 Patient transported to CT with this RN at bedside. Pt calm and cooperative since his pastor arrived and agrees to CT at this time. C collar remains in place.

## 2024-02-12 NOTE — ED Triage Notes (Addendum)
 Patient BIB EMS from home after wife called 911 stating that the patient was brought home sometime tonight or this morning from work where his coworkers brought him to the ED last night due to AMS and he started becoming aggressive and violent throwing things around the house and then while she was outside on the phone she saw him pass out through the window. Patient does appear to have chipped his tooth and bit his tongue. Patient was not aggressive with EMS, however, becomes boisterous when techs try to hook him up to monitor.
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
## 2024-02-12 NOTE — ED Notes (Addendum)
 Attempted to take patient to CT x2. Patient became aggressive swinging arms around at staff after attempting to move him onto the CT table so we came back and gave him 10 of haloperidol . Patient was taken to CT again after medicine had time to kick in and he became aggressive again when attempting to get him situated onto the CT table blowing air/spitting at staff. Unable to take patient to CT at this time.

## 2024-02-12 NOTE — ED Provider Notes (Signed)
 [place] EMERGENCY DEPARTMENT AT [facility] Provider Note   CSN: [id number] Arrival date & time: 02/12/24  1535     Patient presents with: Altered Mental Status and Loss of Consciousness   B D Kokou Ritthaler is a 43 y.o. male.   HPI Presents initially via EMS who provide history, but subsequently joined by his wife and pastor. Patient has a history of schizophrenia, had been doing generally well until a few days ago.  Wife notes that the patient did not receive an anticipated promotion at work, and over the past 2 days in particular has decompensated, with new withdrawn status, minimal verbal interaction, and yesterday after being taken to the hospital, he absconded, was found in a wheelchair at 3 AM outside of the facility.  He was brought to work, and from there taken home, subsequently brought here for evaluation.  On the patient is not offering any verbal interaction.  Per family the patient was at home, agitated, fell striking his face, sustaining a laceration of his tongue, possible broken tooth.  Patient cannot provide any of these details.  EMS reports patient was hemodynamically stable and route though he did have episodes of aggressiveness when he was being positioned.     Prior to Admission medications   Not on File    Allergies: Atorvastatin    Review of Systems  Updated Vital Signs BP (!) 147/82   Pulse 92   Temp 98.5 F (36.9 C) (Axillary)   Resp 17   SpO2 100%   Physical Exam Vitals and nursing note reviewed.  Constitutional:      General: He is not in acute distress.    Appearance: He is well-developed.  HENT:     Head: Normocephalic and atraumatic.  Eyes:     Conjunctiva/sclera: Conjunctivae normal.  Cardiovascular:     Rate and Rhythm: Normal rate and regular rhythm.     Pulses: Normal pulses.  Pulmonary:     Effort: Pulmonary effort is normal. No respiratory distress.     Breath sounds: No stridor.  Abdominal:     General: There is no  distension.  Skin:    General: Skin is warm and dry.  Neurological:     Mental Status: He is alert.     Comments: Patient is awake, alert, sitting upright, sticking on his tongue intermittently, but not verbal. He is moving all extremity spontaneously.   Psychiatric:     Comments: Not forthcoming with any details     (all labs ordered are listed, but only abnormal results are displayed) Labs Reviewed  COMPREHENSIVE METABOLIC PANEL WITH GFR - Abnormal; Notable for the following components:      Result Value   Potassium 3.4 (*)    Glucose, Bld 103 (*)    Total Bilirubin 4.4 (*)    All other components within normal limits  ACETAMINOPHEN  LEVEL - Abnormal; Notable for the following components:   Acetaminophen  (Tylenol ), Serum <10 (*)    All other components within normal limits  SALICYLATE LEVEL - Abnormal; Notable for the following components:   Salicylate Lvl <7.0 (*)    All other components within normal limits  CBC WITH DIFFERENTIAL/PLATELET - Abnormal; Notable for the following components:   WBC 13.5 (*)    Neutro Abs 9.0 (*)    Monocytes Absolute 1.5 (*)    All other components within normal limits  ETHANOL  RAPID URINE DRUG SCREEN, HOSP PERFORMED    EKG: None  Radiology: CT CERVICAL SPINE WO  CONTRAST Result Date: 02/12/2024 EXAM: CT HEAD, FACIAL BONES AND CERVICAL SPINE WITHOUT CONTRAST 02/12/2024 08:34:17 PM TECHNIQUE: CT of the head, facial bones and cervical spine was performed without the administration of intravenous contrast. Multiplanar reformatted images are provided for review. Automated exposure control, iterative reconstruction, and/or weight based adjustment of the mA/kV was utilized to reduce the radiation dose to as low as reasonably achievable. COMPARISON: CT head, cervical spine, and Max low facial 07/16/2018 CLINICAL HISTORY: Head trauma, moderate-severe. Head trauma, facial trauma, c-collar in place. FINDINGS: CT HEAD BRAIN AND VENTRICLES: No acute  intracranial hemorrhage. No mass effect or midline shift. No extra-axial fluid collection. Gray-white differentiation is maintained. No hydrocephalus. SKULL AND SCALP: No acute skull fracture. Mild soft tissue swelling over the left forehead and left aspect of the superorbital ridge. CT FACIAL BONES FACIAL BONES: No acute maxillofacial fracture. No mandibular condyle dislocation. There is a periapical lucency involving the right maxillary central incisor with chronic defect of the overlying cortex. ORBITS: No acute traumatic injury. SINUSES AND MASTOIDS: No acute abnormality. SOFT TISSUES: No acute abnormality. CT CERVICAL SPINE BONES AND ALIGNMENT: The cervical lordosis is maintained. No listhesis, no facet subluxation or dislocation. DEGENERATIVE CHANGES: Mild degenerative endplate osteophytes at multiple levels. No high-grade osseous foraminal stenosis. SOFT TISSUES: No prevertebral soft tissue swelling. IMPRESSION: 1. No acute intracranial abnormality. 2. No acute fracture or traumatic malalignment of the cervical spine. 3. No acute fracture of the facial bones. 4. Mild soft tissue swelling over the left forehead and left aspect of the supraorbital ridge. Electronically signed by: Donnice Mania MD 02/12/2024 09:22 PM EDT RP Workstation: [id number]   CT MAXILLOFACIAL WO CONTRAST Result Date: 02/12/2024 EXAM: CT HEAD, FACIAL BONES AND CERVICAL SPINE WITHOUT CONTRAST 02/12/2024 08:34:17 PM TECHNIQUE: CT of the head, facial bones and cervical spine was performed without the administration of intravenous contrast. Multiplanar reformatted images are provided for review. Automated exposure control, iterative reconstruction, and/or weight based adjustment of the mA/kV was utilized to reduce the radiation dose to as low as reasonably achievable. COMPARISON: CT head, cervical spine, and Max low facial 07/16/2018 CLINICAL HISTORY: Head trauma, moderate-severe. Head trauma, facial trauma, c-collar in place. FINDINGS: CT  HEAD BRAIN AND VENTRICLES: No acute intracranial hemorrhage. No mass effect or midline shift. No extra-axial fluid collection. Gray-white differentiation is maintained. No hydrocephalus. SKULL AND SCALP: No acute skull fracture. Mild soft tissue swelling over the left forehead and left aspect of the superorbital ridge. CT FACIAL BONES FACIAL BONES: No acute maxillofacial fracture. No mandibular condyle dislocation. There is a periapical lucency involving the right maxillary central incisor with chronic defect of the overlying cortex. ORBITS: No acute traumatic injury. SINUSES AND MASTOIDS: No acute abnormality. SOFT TISSUES: No acute abnormality. CT CERVICAL SPINE BONES AND ALIGNMENT: The cervical lordosis is maintained. No listhesis, no facet subluxation or dislocation. DEGENERATIVE CHANGES: Mild degenerative endplate osteophytes at multiple levels. No high-grade osseous foraminal stenosis. SOFT TISSUES: No prevertebral soft tissue swelling. IMPRESSION: 1. No acute intracranial abnormality. 2. No acute fracture or traumatic malalignment of the cervical spine. 3. No acute fracture of the facial bones. 4. Mild soft tissue swelling over the left forehead and left aspect of the supraorbital ridge. Electronically signed by: Donnice Mania MD 02/12/2024 09:22 PM EDT RP Workstation: [id number]   CT HEAD WO CONTRAST Result Date: 02/12/2024 EXAM: CT HEAD, FACIAL BONES AND CERVICAL SPINE WITHOUT CONTRAST 02/12/2024 08:34:17 PM TECHNIQUE: CT of the head, facial bones and cervical spine was performed without the administration of intravenous  contrast. Multiplanar reformatted images are provided for review. Automated exposure control, iterative reconstruction, and/or weight based adjustment of the mA/kV was utilized to reduce the radiation dose to as low as reasonably achievable. COMPARISON: CT head, cervical spine, and Max low facial 07/16/2018 CLINICAL HISTORY: Head trauma, moderate-severe. Head trauma, facial trauma,  c-collar in place. FINDINGS: CT HEAD BRAIN AND VENTRICLES: No acute intracranial hemorrhage. No mass effect or midline shift. No extra-axial fluid collection. Gray-white differentiation is maintained. No hydrocephalus. SKULL AND SCALP: No acute skull fracture. Mild soft tissue swelling over the left forehead and left aspect of the superorbital ridge. CT FACIAL BONES FACIAL BONES: No acute maxillofacial fracture. No mandibular condyle dislocation. There is a periapical lucency involving the right maxillary central incisor with chronic defect of the overlying cortex. ORBITS: No acute traumatic injury. SINUSES AND MASTOIDS: No acute abnormality. SOFT TISSUES: No acute abnormality. CT CERVICAL SPINE BONES AND ALIGNMENT: The cervical lordosis is maintained. No listhesis, no facet subluxation or dislocation. DEGENERATIVE CHANGES: Mild degenerative endplate osteophytes at multiple levels. No high-grade osseous foraminal stenosis. SOFT TISSUES: No prevertebral soft tissue swelling. IMPRESSION: 1. No acute intracranial abnormality. 2. No acute fracture or traumatic malalignment of the cervical spine. 3. No acute fracture of the facial bones. 4. Mild soft tissue swelling over the left forehead and left aspect of the supraorbital ridge. 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  Concern for acute psychosis due to this, though other possibilities including electrolytes,  dehydration, intoxication all considered. In addition, patient had a traumatic event, head CT, neck, face all ordered.   Amount and/or Complexity of Data Reviewed Independent Historian: spouse and EMS External Data Reviewed: notes.    Details: Hospitalization at [facility] yesterday notes reviewed Labs: ordered. Decision-making details documented in ED Course. Radiology: ordered and independent interpretation performed. Decision-making details documented in ED Course. ECG/medicine tests: ordered and independent interpretation performed. Decision-making details documented in ED Course.  Risk Prescription drug management. Decision regarding hospitalization. Diagnosis or treatment significantly limited by social determinants of health.   7:28 PM Attempts at labs, imaging thwarted by the patient's aggressiveness.  He has received Haldol , is now more calm.   10:43 PM Patient calm, cervical collar removed, no agitation.  He remains with atypical speech, according to the patient's wife.  He is now medically appropriate for behavioral health evaluation with reassuring head CT, neck CT, face CT, no intracranial hemorrhage, fracture, labs unremarkable. Suspicion for acute psychosis with agitation secondary to emotional precipitant, as described by family members.    Final diagnoses:  Agitation    ED Discharge Orders     None          Garrick Charleston, MD 02/12/24 2244

## 2024-02-13 ENCOUNTER — Encounter (HOSPITAL_COMMUNITY): Payer: Self-pay | Admitting: Psychiatry

## 2024-02-13 DIAGNOSIS — F209 Schizophrenia, unspecified: Secondary | ICD-10-CM

## 2024-02-13 LAB — RAPID URINE DRUG SCREEN, HOSP PERFORMED
Amphetamines: NOT DETECTED
Barbiturates: NOT DETECTED
Benzodiazepines: POSITIVE — AB
Cocaine: NOT DETECTED
Opiates: NOT DETECTED
Tetrahydrocannabinol: NOT DETECTED

## 2024-02-13 MED ORDER — HALOPERIDOL 5 MG PO TABS: 5.0000 mg | ORAL_TABLET | Freq: Every day | ORAL | 0 refills | Status: AC

## 2024-02-13 MED ORDER — HALOPERIDOL 5 MG PO TABS
5.0000 mg | ORAL_TABLET | Freq: Every day | ORAL | Status: DC
  Administered 2024-02-13: 5 mg via ORAL
  Filled 2024-02-13: qty 1

## 2024-02-13 NOTE — Discharge Instructions (Signed)
-   Recommend please restart Haldol  5 mg by mouth daily - Recommend close outpatient follow-up with the [facility] behavioral health outpatient medication management center - Recommend safety plan listed below   Safety Plan Peter Golden will reach out to Wife, [name], call 911 or call mobile crisis, or go to nearest emergency room if condition worsens or if suicidal thoughts become active [name] will follow up with [facility] for outpatient psychiatric services (therapy/medication management).  The suicide prevention education provided includes the following: Suicide risk factors Suicide prevention and interventions National Suicide Hotline telephone number [facility] assessment telephone number [facility] Emergency Assistance 911 [facility] and/or Residential Mobile Crisis Unit telephone number Request made of family/significant other to:  Wife, Peter Golden weapons (e.g., guns, rifles, knives), all items previously/currently identified as safety concern.   Remove drugs/medications (over the counter, prescriptions, illicit drugs), all items previously/currently identified as a safety concern.

## 2024-02-13 NOTE — ED Provider Notes (Addendum)
 Emergency Medicine Observation Re-evaluation Note  Daelan Gatt is a 43 y.o. male, seen on rounds today.  Pt initially presented to the ED for complaints of Altered Mental Status and Loss of Consciousness Currently, the patient is asleep resting without agitation.  Physical Exam  BP (!) 147/82   Pulse 92   Temp 98.5 F (36.9 C) (Axillary)   Resp 17   SpO2 100%  Physical Exam General: Asleep and resting without distress Cardiac: Not tachycardic unless vital signs Lungs: Symmetric rise and fall of chest without respiratory distress Psych: Agitation at this time  ED Course / MDM  EKG:   I have reviewed the labs performed to date as well as medications administered while in observation.  Recent changes in the last 24 hours include none reported by overnight nursing.  Plan  Current plan is for awaiting psychiatric reevaluation this morning to determine disposition.    Serjio Deupree, Lonni PARAS, MD 02/13/24 0827   2:29 PM Was informed by psychiatry the patient is now safe for discharge home for outpatient psychiatric follow-up.  They recommended 5 to Haldol  daily which she will be prescribed by psychiatry.   Patient will be discharged for outpatient follow-up   Darneshia Demary, Lonni PARAS, MD 02/13/24 1429

## 2024-02-13 NOTE — Consult Note (Cosign Needed Addendum)
 [facility] Psychiatric Consult Follow-up  Patient Name: .Peter Golden  MRN: [id number]  DOB: April 23, 1981  Consult Order details:  Orders (From admission, onward)     Start     Ordered   02/12/24 2212  CONSULT TO CALL ACT TEAM       Ordering Provider: Garrick Charleston, MD  Provider:  (Not yet assigned)  Question:  Reason for Consult?  Answer:  psychosis acute w Hx of schizophrenia   02/12/24 2211             Mode of Visit: In person    Psychiatry Consult Evaluation  Service Date: February 13, 2024 LOS:  LOS: 0 days  Chief Complaint: Psychosis   Primary Psychiatric Diagnoses  Schizophrenia   Assessment   Peter Golden is a 43 y.o. AA male with a past pertinent psychiatric history of schizophrenia, with pertinent medical comorbidities/history that includes none, who presented this encounter by way of EMS after wife called 911 for concerns for altered mental status, who upon EDP evaluation, consulted psychiatry for concerns for psychosis, for specialty evaluation and recommendations.  Patient is currently voluntary at this time, as well as medically clear, per EDP team.  Upon reevaluation and investigation conducted, patient appears to have presented this encounter with symptomology that is most consistent with a brief acute decompensation of the patient's primary mental health illness course of schizophrenia, in the context of psychosocial stressors.  Evidence of this is appreciable from reevaluation and investigation conducted, where it is revealed that recently the patient was passed over for a promotion at work on Tuesday, which led to subsequent bizarre x2 behavioral outbursts over the last 2 days, and ultimately being brought in this encounter, that appears to have since normalized this encounter while in the emergency department during observations, and administration of medications.  During reevaluation, patient presents with no concerns for continued  decompensation into psychosis, suicidal and or homicidal ideations, lack of capacity, and/or with collateral from the patient's wife for concerns for patient's safety, outside of the safe and secure environment of the hospital.  Additionally, under observation, nursing staff report no odd or unusual behaviors, and endorse that the patient slept well over the night, and has been eating throughout the day thus far appropriately.  In collaboration with the patient's wife, patient endorses that he is amenable to restarting oral Haldol  that he has taken in the past that has been helpful for him in maintaining stability of his chronic illness course of schizophrenia, as well as again in collaboration with the patient's wife, extensive safety plan was able to be put into place listed below for safe discharge, and patient reports that he is amenable to close outpatient follow-up with the behavioral health outpatient office, his previous outpatient service location.  Given the above, recommendation at this time is for psychiatric clearance, as well as additional recommendations listed below.  Spoke with Peter Golden who is in agreement with recommendation for psychiatric clearance, as well as additional recommendations listed below.  Diagnoses:  Active Hospital problems: Principal Problem:   Schizophrenia [facility])    Plan   # Schizophrenia  ## Psychiatric Recommendations:   - Recommend Haldol  5 mg p.o. daily - Recommend close outpatient follow-up with the [facility] behavioral health outpatient medication management center - Recommend safety plan listed below  Safety Plan Peter Golden will reach out to Wife, Peter Golden, call 911 or call mobile crisis, or go to nearest emergency room if condition worsens or if suicidal  thoughts become active [name] will follow up with [facility] for outpatient psychiatric services (therapy/medication management).  The suicide prevention education provided  includes the following: Suicide risk factors Suicide prevention and interventions National Suicide Hotline telephone number [facility] assessment telephone number [facility] Emergency Assistance 911 [facility] and/or Residential Mobile Crisis Unit telephone number Request made of family/significant other to:  Wife, Peter Golden Crumb weapons (e.g., guns, rifles, knives), all items previously/currently identified as safety concern.   Remove drugs/medications (over the counter, prescriptions, illicit drugs), all items previously/currently identified as a safety concern.   ## Medical Decision Making Capacity: Has capacity  ## Further Work-up: None at this time  ## Disposition:-- There are no psychiatric contraindications to discharge at this time  ## Behavioral / Environmental: -Routine agitation/safety precautions until discharge; strict adherence to safe discharge planning for the place listed above    ## Safety and Observation Level:  - Based on my clinical evaluation, I estimate the patient to be at low risk of self harm in the current setting and upon recommendation for discharge. - At this time, we recommend  routine. This decision is based on my review of the chart including patient's history and current presentation, interview of the patient, mental status examination, and consideration of suicide risk including evaluating suicidal ideation, plan, intent, suicidal or self-harm behaviors, risk factors, and protective factors. This judgment is based on our ability to directly address suicide risk, implement suicide prevention strategies, and develop a safety plan while the patient is in the clinical setting. Please contact our team if there is a concern that risk level has changed.  CSSR Risk Category:   Suicide Risk Assessment: Patient has following modifiable risk factors for suicide: medication noncompliance and triggering events, which we are addressing by  recommendations. Patient has following non-modifiable or demographic risk factors for suicide: male gender and psychiatric hospitalization Patient has the following protective factors against suicide: Access to outpatient mental health care, Supportive family, Supportive friends, Cultural, spiritual, or religious beliefs that discourage suicide, Frustration tolerance, no history of suicide attempts, and no history of NSSIB  Thank you for this consult request. Recommendations have been communicated to the primary team.  We will sign off at this time.   Peter JINNY Gravely, Peter Golden     History of Present Illness   Peter Golden is a 43 y.o. AA male with a past pertinent psychiatric history of schizophrenia, with pertinent medical comorbidities/history that includes none, who presented this encounter by way of EMS after wife called 911 for concerns for altered mental status, who upon EDP evaluation, consulted psychiatry for concerns for psychosis, for specialty evaluation and recommendations.  Patient is currently voluntary at this time, as well as medically clear, per EDP team.  Patient seen today at the [facility] emergency department for face-to-face psychiatric re-evaluation.  Upon reevaluation, patient endorses that he can now recall the recent events that transpired, of which led to him being brought in this encounter.  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 to be [facility] out, to which upon being checked out for concerns for altered mental status and discharged (I.e., no findings), patient states that he then went out and waited in the lobby for a short period of time, before being picked up by a coworker to be taken home, when he states that upon arriving home and reconnecting with his wife, states that from his remaining recollection, sat down to attempt to have dinner with his wife, when he states that all he remembers thereafter is waking up to be taken to the hospital.  Patient then after telling his recollection of recent events, endorses that he feels that he is good to go, states that, I know I was stressed about my job, but this is no more stress for me, I am okay, I can go home.  Patient denies any other psychosocial stressors and then endorses again that he feels that he is good to go, states, talk to my wife, she will tell you.  Patient endorses that his mood is stable, endorses an euthymic mood, with a congruent interpersonal style, fair eye contact, normal attention, and appropriate affect.  Patient orientation is intact, no concerns for fluctuations consciousness, and objectively, does not appear to be presenting with psychotic features, denies any auditory and or visual hallucinations.  Patient endorses no depression or anxiety.  Patient endorses no suicidal and or homicidal ideations.  Patient denies any suicide attempts and/or self-injurious behavior history.  UDS unremarkable, BAL unremarkable.  Patient endorses that he is not on any medications for his mental or physical health, states that he stopped taking Haldol  LAI many years ago, due to the burden of having to receive a shot that bruised his arm every month.  Patient affirms his history of multiple inpatient mental health hospitalizations for  decompensation of schizophrenia, as well as history of seeing his outpatient provider team at the [facility] for some time.  Patient denies any drugs, EtOH, and or tobacco, past or present.  Patient endorses outside of the recent bizarre events, states that he is sleeping and eating without problems.  Patient endorses that he married his wife 2 years ago, states that they live together in a home.  Patient endorses no pain or any physical symptoms of concern.  Patient endorses that his thoughts feel clear, denies any thought blocking, or other concerning thought processing symptomology.  Patient does not appear to be presenting with concerns for psychomotor slowing and/or negative symptoms of schizophrenia.  Discussed with patient that there is clinical concern that while he is currently doing better, it could be as a result of receiving antipsychotics this encounter due to agitation upon arrival, and that it would be most advisable to restart oral Haldol , closely follow-up with the [facility] behavioral health outpatient office, and to put safety plan together with his wife that needs to be strictly adhere to, if discharge was to be considered, to which patient endorsed that he was amenable to this.  Nursing/chart review: Outside of initial psychotic presentation, patient has since under observation, presented with no continued observations of being psychotic, was endorsed to have slept well over the night, ate throughout the day without any complications, and  has been socializing appropriately staff and with his wife who has been at bedside all day.  Collateral, patient's wife, Peter Golden, spoken to extensively at bedside  Patient's wife spoken to at bedside, both with and without the patient.  Patient's wife largely affirms the information provided by the patient, with the addition of she is able to fill in the details the patient cannot recall, around when he  came home and attempted to have dinner with her, before abruptly needing to call 911 and have the patient brought to the hospital by way of EMS.  Expanding on this portion of missing information, patient's wife reports that when the patient was dropped off by his coworker, and she sat the patient down to prepare for dinner, states that he abruptly got up and proceeded to throw milk she had poured for him on the floor, and began acting bizarrely, which she states led to her stepping outside to call 911 to have the patient brought to the hospital, when she states that looking through the window while on the phone, states that she saw the patient fall to the floor and briefly lose consciousness for a brief moment, before eventually being brought in by way of EMS services when they arrived.   Patient's wife reports that outside of the recent events that transpired, states that the patient has been doing fine, denies that there are any safety concerns, unusual behavior outside of the last few days, or concerns for other psychosocial stressors.  Patient's wife endorses that she believes that her husband is currently better, states that he appears at his baseline, and through extensive conversation around discharge planning and recommendations, patient's wife states that she supports the recommendations and plan discussed today, and has no questions or concerns for safe discharge.  Review of Systems  Constitutional:  Negative for chills, fever, malaise/fatigue and weight loss.  Eyes:  Negative for blurred vision, double vision and pain.  Cardiovascular:  Negative for chest pain.  Gastrointestinal:  Negative for abdominal pain, constipation, diarrhea, nausea and vomiting.  Musculoskeletal:  Positive for falls. Negative for back pain, joint pain, myalgias and neck pain.  Neurological:  Negative for dizziness, tingling, tremors, seizures, loss of consciousness, weakness and headaches.  Psychiatric/Behavioral:   Negative for depression, hallucinations, substance abuse and suicidal ideas. The patient is not nervous/anxious and does not have insomnia.   All other systems reviewed and are negative.    Psychiatric and Social History  Psychiatric History:  Information collected from Chart review/patient/wife  Prev Dx/Sx: Schizophrenia Current Psych Provider: None currently Home Meds (current):  Previous Med Trials: Haldol  LAI Therapy: None endorsed  Prior Psych Hospitalization: Multiple, most recent [facility] Prior Self Harm: None reported Prior Violence: History of getting violent with staff during psychosis  Family Psych History: None reported Family Hx suicide: None reported  Social History:  Developmental Hx: None reported Educational Hx: None reported 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[employer], 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 Legal Hx: Has been IVC before Living Situation: Lives with wife Spiritual Hx: None reported  Access to weapons/lethal means: Yes, guns in the home, safety planning conducted with wife, guns removed by family  Substance History Alcohol: Denies Tobacco: Denies Illicit drugs: Denies Prescription drug abuse: Denies Rehab hx: Denies  Exam Findings  Physical Exam: As below Vital Signs:  Temp:  [98.5 F (36.9 C)] 98.5 F (36.9 C) (08/15 1004) Pulse Rate:  [92-103] 95 (08/15 1004) Resp:  [17-18] 18 (08/15 1004) BP: (121-147)/(69-82) 121/77 (  08/15 1004) SpO2:  [94 %-100 %] 94 % (08/15 1004) Blood pressure 121/77, pulse 95, temperature 98.5 F (36.9 C), temperature source Oral, resp. rate 18, SpO2 94%. There is no height or weight on file to calculate BMI.  Physical Exam Vitals and nursing note reviewed.  Constitutional:      General: He is not in acute distress.    Appearance: Normal appearance. He is normal weight. He is not ill-appearing, toxic-appearing or diaphoretic.  Pulmonary:     Effort: Pulmonary effort is normal.  Skin:    General: Skin is warm and  dry.  Neurological:     Mental Status: He is alert and oriented to person, place, and time.     Motor: No weakness, tremor or seizure activity.  Psychiatric:        Attention and Perception: Attention and perception normal. He does not perceive auditory or visual hallucinations.        Mood and Affect: Mood and affect normal.        Speech: Speech normal.        Behavior: Behavior is cooperative.        Thought Content: Thought content normal. Thought content is not paranoid or delusional. Thought content does not include homicidal or suicidal ideation.        Cognition and Memory: Cognition and memory normal.        Judgment: Judgment normal.    Mental Status Exam: General Appearance: African-American male in scrubs with fair grooming  Orientation:  Full (Time, Place, and Person)  Memory:  Fair  Concentration:  Grossly intact  Recall: Fair  Attention  Fair  Eye Contact:  Good  Speech:  Clear and Coherent and Normal Rate  Language:  Fair  Volume:  Normal  Mood: Euthymic  Affect:  Neutral  Thought Process:  Coherent, Goal Directed, and Linear  Thought Content:  Logical  Suicidal Thoughts:  No  Homicidal Thoughts:  No  Judgement:  Intact  Insight:  Present  Psychomotor Activity:  Normal  Akathisia:  No  Fund of Knowledge:  Fair      Assets:  Manufacturing systems engineer Desire for Improvement Financial Resources/Insurance Housing Intimacy Leisure Time Physical Health Resilience Social Support Talents/Skills Transportation Vocational/Educational  Cognition:  WNL  ADL's:  Intact  AIMS (if indicated):   0     Other History   These have been pulled in through the EMR, reviewed, and updated if appropriate.  Family History:  The patient's family history is not on file.  Medical History: Past Medical History:  Diagnosis Date   Schizo affective schizophrenia (HCC)    Seizures (HCC)     Surgical History: Past Surgical History:  Procedure Laterality Date   IR  THORACENTESIS ASP PLEURAL SPACE W/IMG GUIDE  04/22/2018   LAPAROTOMY N/A 04/15/2018   Procedure: EXPLORATORY LAPAROTOMY with splenectomy, transverse colectomy, partial gastrectomy, repair of diaphragmatic hernia, left 14 Fr pigtail tube thoracostomy, repair of umbilical hernia;  Surgeon: Aron Shoulders, MD;  Location: [facility] OR;  Service: General;  Laterality: N/A;   XI ROBOTIC ASSISTED COLOSTOMY TAKEDOWN N/A 03/31/2019   Procedure: ROBOTIC ASSISTED COLOSTOMY REVERSAL, SPLENIC FLEXURE TAKEDOWN;  Surgeon: Debby Hila, MD;  Location: [facility] ORS;  Service: General;  Laterality: N/A;     Medications:   Current Facility-Administered Medications:    acetaminophen  (TYLENOL ) tablet 650 mg, 650 mg, Oral, Q4H PRN, Garrick Charleston, MD   haloperidol  decanoate (HALDOL  DECANOATE) 50 MG/ML injection 100 mg, 100 mg, Intramuscular, Q30 days, Harl, [place] E,  Peter Golden, 100 mg at 08/07/21 1112   ziprasidone  (GEODON ) injection 20 mg, 20 mg, Intramuscular, Q12H PRN **AND** LORazepam  (ATIVAN ) tablet 1 mg, 1 mg, Oral, PRN, Garrick Charleston, MD   ondansetron  (ZOFRAN ) tablet 4 mg, 4 mg, Oral, Q8H PRN, Garrick Charleston, MD No current outpatient medications on file.  Allergies: Allergies  Allergen Reactions   Atorvastatin Dermatitis    Peter JINNY Gravely, Peter Golden

## 2024-02-13 NOTE — ED Notes (Signed)
 Wife will be taking all pt belongings-Monique,RN
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
## 2024-02-13 NOTE — ED Notes (Signed)
 TTS in process

## 2024-04-05 DIAGNOSIS — E7879 Other disorders of bile acid and cholesterol metabolism: Secondary | ICD-10-CM | POA: Diagnosis not present

## 2024-05-21 DIAGNOSIS — Z79899 Other long term (current) drug therapy: Secondary | ICD-10-CM | POA: Diagnosis not present

## 2024-05-21 DIAGNOSIS — I10 Essential (primary) hypertension: Secondary | ICD-10-CM | POA: Diagnosis not present

## 2024-05-21 DIAGNOSIS — F250 Schizoaffective disorder, bipolar type: Secondary | ICD-10-CM | POA: Diagnosis not present

## 2024-05-21 DIAGNOSIS — Z91148 Patient's other noncompliance with medication regimen for other reason: Secondary | ICD-10-CM | POA: Diagnosis not present

## 2024-05-21 DIAGNOSIS — Z566 Other physical and mental strain related to work: Secondary | ICD-10-CM | POA: Diagnosis not present

## 2024-05-21 DIAGNOSIS — R4182 Altered mental status, unspecified: Secondary | ICD-10-CM | POA: Diagnosis not present

## 2024-05-21 DIAGNOSIS — E782 Mixed hyperlipidemia: Secondary | ICD-10-CM | POA: Diagnosis not present

## 2024-05-21 DIAGNOSIS — R7303 Prediabetes: Secondary | ICD-10-CM | POA: Diagnosis not present

## 2024-05-21 DIAGNOSIS — F209 Schizophrenia, unspecified: Secondary | ICD-10-CM | POA: Diagnosis not present

## 2024-05-21 DIAGNOSIS — F29 Unspecified psychosis not due to a substance or known physiological condition: Secondary | ICD-10-CM | POA: Diagnosis not present

## 2024-05-21 DIAGNOSIS — F061 Catatonic disorder due to known physiological condition: Secondary | ICD-10-CM | POA: Diagnosis not present

## 2024-05-21 DIAGNOSIS — Z556 Problems related to health literacy: Secondary | ICD-10-CM | POA: Diagnosis not present
